# Patient Record
Sex: Male | Born: 1950 | Race: Black or African American | Hispanic: No | Marital: Married | Smoking: Former smoker
Health system: Southern US, Community
[De-identification: ages and names within clinical notes are randomized; demographics above are authoritative.]

## PROBLEM LIST (undated history)

## (undated) DIAGNOSIS — I213 ST elevation (STEMI) myocardial infarction of unspecified site: Secondary | ICD-10-CM

## (undated) DIAGNOSIS — E785 Hyperlipidemia, unspecified: Secondary | ICD-10-CM

## (undated) HISTORY — DX: ST elevation (STEMI) myocardial infarction of unspecified site: I21.3

## (undated) HISTORY — DX: Hyperlipidemia, unspecified: E78.5

---

## 1999-06-16 ENCOUNTER — Emergency Department (HOSPITAL_COMMUNITY): Admission: EM | Admit: 1999-06-16 | Discharge: 1999-06-16 | Payer: Self-pay | Admitting: Internal Medicine

## 1999-06-16 ENCOUNTER — Encounter: Payer: Self-pay | Admitting: Internal Medicine

## 2000-08-08 ENCOUNTER — Emergency Department (HOSPITAL_COMMUNITY): Admission: EM | Admit: 2000-08-08 | Discharge: 2000-08-08 | Payer: Self-pay | Admitting: Emergency Medicine

## 2000-08-09 ENCOUNTER — Encounter: Payer: Self-pay | Admitting: Emergency Medicine

## 2008-09-30 DIAGNOSIS — I10 Essential (primary) hypertension: Secondary | ICD-10-CM

## 2008-09-30 HISTORY — DX: Essential (primary) hypertension: I10

## 2013-09-30 DIAGNOSIS — I219 Acute myocardial infarction, unspecified: Secondary | ICD-10-CM

## 2013-09-30 DIAGNOSIS — R7303 Prediabetes: Secondary | ICD-10-CM

## 2013-09-30 HISTORY — DX: Prediabetes: R73.03

## 2013-09-30 HISTORY — DX: Acute myocardial infarction, unspecified: I21.9

## 2019-10-07 ENCOUNTER — Ambulatory Visit (INDEPENDENT_AMBULATORY_CARE_PROVIDER_SITE_OTHER): Payer: Commercial Managed Care - PPO | Admitting: Internal Medicine

## 2019-10-07 ENCOUNTER — Encounter: Payer: Self-pay | Admitting: Internal Medicine

## 2019-10-07 ENCOUNTER — Other Ambulatory Visit: Payer: Self-pay

## 2019-10-07 VITALS — BP 124/80 | HR 84 | Temp 97.2°F | Resp 16 | Ht 70.0 in | Wt 154.2 lb

## 2019-10-07 DIAGNOSIS — E782 Mixed hyperlipidemia: Secondary | ICD-10-CM

## 2019-10-07 DIAGNOSIS — Z8249 Family history of ischemic heart disease and other diseases of the circulatory system: Secondary | ICD-10-CM

## 2019-10-07 DIAGNOSIS — E559 Vitamin D deficiency, unspecified: Secondary | ICD-10-CM

## 2019-10-07 DIAGNOSIS — Z136 Encounter for screening for cardiovascular disorders: Secondary | ICD-10-CM | POA: Diagnosis not present

## 2019-10-07 DIAGNOSIS — Z0001 Encounter for general adult medical examination with abnormal findings: Secondary | ICD-10-CM

## 2019-10-07 DIAGNOSIS — Z0000 Encounter for general adult medical examination without abnormal findings: Secondary | ICD-10-CM

## 2019-10-07 DIAGNOSIS — N138 Other obstructive and reflux uropathy: Secondary | ICD-10-CM

## 2019-10-07 DIAGNOSIS — R7303 Prediabetes: Secondary | ICD-10-CM

## 2019-10-07 DIAGNOSIS — R5383 Other fatigue: Secondary | ICD-10-CM

## 2019-10-07 DIAGNOSIS — Z1211 Encounter for screening for malignant neoplasm of colon: Secondary | ICD-10-CM

## 2019-10-07 DIAGNOSIS — N401 Enlarged prostate with lower urinary tract symptoms: Secondary | ICD-10-CM

## 2019-10-07 DIAGNOSIS — I10 Essential (primary) hypertension: Secondary | ICD-10-CM | POA: Diagnosis not present

## 2019-10-07 DIAGNOSIS — Z1212 Encounter for screening for malignant neoplasm of rectum: Secondary | ICD-10-CM

## 2019-10-07 DIAGNOSIS — Z125 Encounter for screening for malignant neoplasm of prostate: Secondary | ICD-10-CM

## 2019-10-07 DIAGNOSIS — Z79899 Other long term (current) drug therapy: Secondary | ICD-10-CM

## 2019-10-07 DIAGNOSIS — R7309 Other abnormal glucose: Secondary | ICD-10-CM
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
## 2019-10-07 NOTE — Patient Instructions (Signed)

## 2019-10-08 LAB — COMPLETE METABOLIC PANEL WITH GFR
AG Ratio: 1.8 (calc) (ref 1.0–2.5)
ALT: 22 U/L (ref 9–46)
AST: 25 U/L (ref 10–35)
Albumin: 4.3 g/dL (ref 3.6–5.1)
Alkaline phosphatase (APISO): 85 U/L (ref 35–144)
BUN: 20 mg/dL (ref 7–25)
CO2: 35 mmol/L — ABNORMAL HIGH (ref 20–32)
Calcium: 10 mg/dL (ref 8.6–10.3)
Chloride: 101 mmol/L (ref 98–110)
Creat: 1.11 mg/dL (ref 0.70–1.25)
GFR, Est African American: 80 mL/min/{1.73_m2} (ref >=60)
GFR, Est Non African American: 69 mL/min/{1.73_m2} (ref >=60)
Globulin: 2.4 g/dL (calc) (ref 1.9–3.7)
Glucose, Bld: 87 mg/dL (ref 65–99)
Potassium: 5.1 mmol/L (ref 3.5–5.3)
Sodium: 139 mmol/L (ref 135–146)
Total Bilirubin: 0.5 mg/dL (ref 0.2–1.2)
Total Protein: 6.7 g/dL (ref 6.1–8.1)

## 2019-10-08 LAB — MICROALBUMIN / CREATININE URINE RATIO
Creatinine, Urine: 128 mg/dL (ref 20–320)
Microalb, Ur: <0.2 mg/dL

## 2019-10-08 LAB — PSA: PSA: 0.9 ng/mL (ref <=4.0)

## 2019-10-08 LAB — CBC WITH DIFFERENTIAL/PLATELET
Absolute Monocytes: 625 cells/uL (ref 200–950)
Basophils Absolute: 28 cells/uL (ref 0–200)
Basophils Relative: 0.6 %
Eosinophils Absolute: 169 cells/uL (ref 15–500)
Eosinophils Relative: 3.6 %
HCT: 40.3 % (ref 38.5–50.0)
Hemoglobin: 13.4 g/dL (ref 13.2–17.1)
Lymphs Abs: 1932 cells/uL (ref 850–3900)
MCH: 30.1 pg (ref 27.0–33.0)
MCHC: 33.3 g/dL (ref 32.0–36.0)
MCV: 90.6 fL (ref 80.0–100.0)
MPV: 10.4 fL (ref 7.5–12.5)
Monocytes Relative: 13.3 %
Neutro Abs: 1946 cells/uL (ref 1500–7800)
Neutrophils Relative %: 41.4 %
Platelets: 207 10*3/uL (ref 140–400)
RBC: 4.45 10*6/uL (ref 4.20–5.80)
RDW: 12.7 % (ref 11.0–15.0)
Total Lymphocyte: 41.1 %
WBC: 4.7 10*3/uL (ref 3.8–10.8)

## 2019-10-08 LAB — URINALYSIS, ROUTINE W REFLEX MICROSCOPIC
Bilirubin Urine: NEGATIVE
Glucose, UA: NEGATIVE
Hgb urine dipstick: NEGATIVE
Ketones, ur: NEGATIVE
Leukocytes,Ua: NEGATIVE
Nitrite: NEGATIVE
Protein, ur: NEGATIVE
Specific Gravity, Urine: 1.017 (ref 1.001–1.03)
pH: 7.5 (ref 5.0–8.0)

## 2019-10-08 LAB — MAGNESIUM: Magnesium: 2 mg/dL (ref 1.5–2.5)

## 2019-10-08 LAB — LIPID PANEL
Cholesterol: 164 mg/dL (ref <200)
HDL: 57 mg/dL (ref >=40)
LDL Cholesterol (Calc): 93 mg/dL (calc)
Non-HDL Cholesterol (Calc): 107 mg/dL (calc) (ref <130)
Total CHOL/HDL Ratio: 2.9 (calc) (ref <5.0)
Triglycerides: 57 mg/dL (ref <150)

## 2019-10-08 LAB — HEMOGLOBIN A1C
Hgb A1c MFr Bld: 6.2 % of total Hgb — ABNORMAL HIGH (ref <5.7)
Mean Plasma Glucose: 131 (calc)
eAG (mmol/L): 7.3 (calc)

## 2019-10-08 LAB — IRON, TOTAL/TOTAL IRON BINDING CAP
%SAT: 23 % (calc) (ref 20–48)
Iron: 63 ug/dL (ref 50–180)
TIBC: 277 mcg/dL (calc) (ref 250–425)

## 2019-10-08 LAB — VITAMIN D 25 HYDROXY (VIT D DEFICIENCY, FRACTURES): Vit D, 25-Hydroxy: 46 ng/mL (ref 30–100)

## 2019-10-08 LAB — TSH: TSH: 1.36 mIU/L (ref 0.40–4.50)

## 2019-10-08 LAB — INSULIN, RANDOM: Insulin: 2.8 u[IU]/mL

## 2019-10-08 LAB — TESTOSTERONE: Testosterone: 263 ng/dL (ref 250–827)

## 2019-10-08 LAB — VITAMIN B12: Vitamin B-12: 1654 pg/mL — ABNORMAL HIGH (ref 200–1100)

## 2019-10-10 ENCOUNTER — Encounter: Payer: Self-pay | Admitting: Internal Medicine

## 2019-10-10 MED ORDER — CARVEDILOL 6.25 MG PO TABS: ORAL_TABLET | ORAL | 0 refills | Status: DC

## 2019-10-10 MED ORDER — MULTI-VITAMIN/MINERALS PO TABS: ORAL_TABLET | ORAL | Status: DC

## 2019-10-10 MED ORDER — VITAMIN D3 125 MCG (5000 UT) PO TBDP: 5000.0000 [IU] | ORAL_TABLET | Freq: Every day | ORAL | 0 refills | Status: DC

## 2019-10-10 MED ORDER — ATORVASTATIN CALCIUM 80 MG PO TABS: ORAL_TABLET | ORAL | 0 refills | Status: DC

## 2019-10-10 MED ORDER — VITAMIN C PLUS 1000 MG PO TABS: ORAL_TABLET | ORAL | Status: DC

## 2019-10-10 MED ORDER — LISINOPRIL 2.5 MG PO TABS: ORAL_TABLET | ORAL | 0 refills | Status: DC

## 2020-01-10 ENCOUNTER — Ambulatory Visit (INDEPENDENT_AMBULATORY_CARE_PROVIDER_SITE_OTHER): Payer: Medicare HMO | Admitting: Internal Medicine

## 2020-01-10 ENCOUNTER — Other Ambulatory Visit: Payer: Self-pay

## 2020-01-10 VITALS — BP 110/64 | HR 72 | Temp 97.2°F | Resp 16 | Ht 70.0 in | Wt 153.6 lb

## 2020-01-10 DIAGNOSIS — Z87891 Personal history of nicotine dependence: Secondary | ICD-10-CM | POA: Insufficient documentation

## 2020-01-10 DIAGNOSIS — E559 Vitamin D deficiency, unspecified: Secondary | ICD-10-CM

## 2020-01-10 DIAGNOSIS — I10 Essential (primary) hypertension: Secondary | ICD-10-CM

## 2020-01-10 DIAGNOSIS — R7309 Other abnormal glucose: Secondary | ICD-10-CM | POA: Diagnosis not present

## 2020-01-10 DIAGNOSIS — R5383 Other fatigue: Secondary | ICD-10-CM | POA: Diagnosis not present

## 2020-01-10 DIAGNOSIS — E782 Mixed hyperlipidemia: Secondary | ICD-10-CM

## 2020-01-10 DIAGNOSIS — R7303 Prediabetes: Secondary | ICD-10-CM | POA: Diagnosis not present

## 2020-01-10 DIAGNOSIS — M778 Other enthesopathies, not elsewhere classified: Secondary | ICD-10-CM | POA: Diagnosis not present

## 2020-01-10 DIAGNOSIS — I2510 Atherosclerotic heart disease of native coronary artery without angina pectoris: Secondary | ICD-10-CM | POA: Insufficient documentation

## 2020-01-10 DIAGNOSIS — Z79899 Other long term (current) drug therapy: Secondary | ICD-10-CM | POA: Diagnosis not present

## 2020-01-10 DIAGNOSIS — E785 Hyperlipidemia, unspecified: Secondary | ICD-10-CM | POA: Insufficient documentation

## 2020-01-10 DIAGNOSIS — I739 Peripheral vascular disease, unspecified: Secondary | ICD-10-CM | POA: Insufficient documentation

## 2020-01-10 MED ORDER — DEXAMETHASONE 6 MG PO TABS: ORAL_TABLET | ORAL | 0 refills | Status: DC
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
## 2020-01-10 NOTE — Patient Instructions (Signed)
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
## 2020-01-11 LAB — LIPID PANEL
Cholesterol: 155 mg/dL (ref <200)
HDL: 58 mg/dL (ref >=40)
LDL Cholesterol (Calc): 69 mg/dL (calc)
Non-HDL Cholesterol (Calc): 97 mg/dL (calc) (ref <130)
Total CHOL/HDL Ratio: 2.7 (calc) (ref <5.0)
Triglycerides: 216 mg/dL — ABNORMAL HIGH (ref <150)

## 2020-01-11 LAB — COMPLETE METABOLIC PANEL WITH GFR
AG Ratio: 1.9 (calc) (ref 1.0–2.5)
ALT: 26 U/L (ref 9–46)
AST: 24 U/L (ref 10–35)
Albumin: 4.3 g/dL (ref 3.6–5.1)
Alkaline phosphatase (APISO): 81 U/L (ref 35–144)
BUN: 21 mg/dL (ref 7–25)
CO2: 30 mmol/L (ref 20–32)
Calcium: 9.7 mg/dL (ref 8.6–10.3)
Chloride: 103 mmol/L (ref 98–110)
Creat: 1.03 mg/dL (ref 0.70–1.25)
GFR, Est African American: 87 mL/min/{1.73_m2} (ref >=60)
GFR, Est Non African American: 75 mL/min/{1.73_m2} (ref >=60)
Globulin: 2.3 g/dL (calc) (ref 1.9–3.7)
Glucose, Bld: 98 mg/dL (ref 65–99)
Potassium: 4.2 mmol/L (ref 3.5–5.3)
Sodium: 141 mmol/L (ref 135–146)
Total Bilirubin: 0.3 mg/dL (ref 0.2–1.2)
Total Protein: 6.6 g/dL (ref 6.1–8.1)

## 2020-01-11 LAB — CBC WITH DIFFERENTIAL/PLATELET
Absolute Monocytes: 555 cells/uL (ref 200–950)
Basophils Absolute: 52 cells/uL (ref 0–200)
Basophils Relative: 1.2 %
Eosinophils Absolute: 159 cells/uL (ref 15–500)
Eosinophils Relative: 3.7 %
HCT: 39.7 % (ref 38.5–50.0)
Hemoglobin: 13.3 g/dL (ref 13.2–17.1)
Lymphs Abs: 1922 cells/uL (ref 850–3900)
MCH: 30.7 pg (ref 27.0–33.0)
MCHC: 33.5 g/dL (ref 32.0–36.0)
MCV: 91.7 fL (ref 80.0–100.0)
MPV: 10.8 fL (ref 7.5–12.5)
Monocytes Relative: 12.9 %
Neutro Abs: 1613 cells/uL (ref 1500–7800)
Neutrophils Relative %: 37.5 %
Platelets: 232 10*3/uL (ref 140–400)
RBC: 4.33 10*6/uL (ref 4.20–5.80)
RDW: 12.5 % (ref 11.0–15.0)
Total Lymphocyte: 44.7 %
WBC: 4.3 10*3/uL (ref 3.8–10.8)

## 2020-01-11 LAB — VITAMIN D 25 HYDROXY (VIT D DEFICIENCY, FRACTURES): Vit D, 25-Hydroxy: 116 ng/mL — ABNORMAL HIGH (ref 30–100)

## 2020-01-11 LAB — INSULIN, RANDOM: Insulin: 12.4 u[IU]/mL

## 2020-01-11 LAB — HEMOGLOBIN A1C
Hgb A1c MFr Bld: 6 % of total Hgb — ABNORMAL HIGH (ref <5.7)
Mean Plasma Glucose: 126 (calc)
eAG (mmol/L): 7 (calc)

## 2020-01-11 LAB — TSH: TSH: 1.17 mIU/L (ref 0.40–4.50)

## 2020-01-11 LAB — MAGNESIUM: Magnesium: 1.7 mg/dL (ref 1.5–2.5)

## 2020-01-12 ENCOUNTER — Ambulatory Visit: Payer: Commercial Managed Care - PPO | Admitting: Adult Health

## 2020-01-13 ENCOUNTER — Ambulatory Visit: Payer: Commercial Managed Care - PPO | Admitting: Internal Medicine

## 2020-01-16 ENCOUNTER — Encounter: Payer: Self-pay | Admitting: Internal Medicine

## 2020-01-21 ENCOUNTER — Telehealth: Payer: Self-pay | Admitting: *Deleted

## 2020-01-21 ENCOUNTER — Other Ambulatory Visit: Payer: Self-pay | Admitting: Internal Medicine

## 2020-01-21 NOTE — Telephone Encounter (Signed)
Patient called and reported his has taken the Dexamethasone for his right shoulder pain, with little improvement. A referral was called to [facility] and patient information was faxed to them. Patient will be called by the orthopaedic office in regard to an appointment. Also, the patient complained of continued cramping. Per Dr Melford Aase, stop the Atorvastatin for 1 week and see if cramping improves and call back with the result. Patient is aware.

## 2020-01-24 DIAGNOSIS — M542 Cervicalgia: Secondary | ICD-10-CM | POA: Diagnosis not present

## 2020-01-24 DIAGNOSIS — M67911 Unspecified disorder of synovium and tendon, right shoulder: Secondary | ICD-10-CM | POA: Diagnosis not present

## 2020-01-27 ENCOUNTER — Telehealth: Payer: Self-pay | Admitting: *Deleted

## 2020-01-27 DIAGNOSIS — M542 Cervicalgia: Secondary | ICD-10-CM | POA: Diagnosis not present

## 2020-01-27 DIAGNOSIS — M25611 Stiffness of right shoulder, not elsewhere classified: Secondary | ICD-10-CM | POA: Diagnosis not present

## 2020-01-27 NOTE — Telephone Encounter (Signed)
Patient called and reported he is still having muscle cramps. He stopped his Atorvastatin, as directed by Dr Melford Aase, but crampinp continues. Per Dr Melford Aase, the patient scheduled an OV for tomorrow.

## 2020-01-28 ENCOUNTER — Other Ambulatory Visit: Payer: Self-pay

## 2020-01-28 ENCOUNTER — Ambulatory Visit (INDEPENDENT_AMBULATORY_CARE_PROVIDER_SITE_OTHER): Payer: Medicare HMO | Admitting: Internal Medicine

## 2020-01-28 VITALS — BP 118/80 | HR 80 | Temp 97.2°F | Resp 16 | Ht 70.0 in | Wt 151.4 lb

## 2020-01-28 DIAGNOSIS — R252 Cramp and spasm: Secondary | ICD-10-CM | POA: Diagnosis not present

## 2020-01-28 DIAGNOSIS — I10 Essential (primary) hypertension: Secondary | ICD-10-CM | POA: Diagnosis not present

## 2020-01-28 DIAGNOSIS — D508 Other iron deficiency anemias: Secondary | ICD-10-CM | POA: Diagnosis not present

## 2020-01-28 DIAGNOSIS — Z79899 Other long term (current) drug therapy: Secondary | ICD-10-CM | POA: Diagnosis not present

## 2020-01-28 MED ORDER — CYCLOBENZAPRINE HCL 10 MG PO TABS: ORAL_TABLET | ORAL | 0 refills | Status: DC

## 2020-01-28 NOTE — Progress Notes (Signed)
   History of Present Illness:                  This very nice 69 y.o. MBM for HTN, HLD, ASCVD s/p PCA/ZStent, ASPVD s/p Stent of Lt SFA, Prediabetes and Vitamin D Deficiency.  On 01/10/2020 , he was Rx'd a Decadron taper for Rt shoulder pain . Not improved on 01/21/2020, patient was referred to [facility] for Rt shoulder pain. Also, he continued to c/o  muscle cramping & was advised to stop his Atorvastatin. On 4/29, he reported ongoing muscle cramps.    Medications  .  atorvastatin (LIPITOR) 80 MG tablet, Take 1 tablet Daily   - Off x 1 week since 4/23. Marland Kitchen  lisinopril (ZESTRIL) 2.5 MG tablet, Take 1 tablet Daily .  cilostazol (PLETAL) 100 MG tablet, Take  2  times daily. Marland Kitchen  Bioflavonoid Products (VITAMIN C PLUS) 1000 MG, Take 1 tablet Daily .  VITAMIN D 5000 U, Take 5,000 Units  daily .  Multiple Vitamins-Minerals, Take 1 tablet Daily .  cyclobenzaprine  10 MG tablet, Take 1/2 to 1 tablet 3 x /day as needed for Muscle Spasm  Problem list He has Hypertension; Hyperlipidemia; Vitamin D deficiency; Former smoker; CAD (coronary artery disease); and PAD (peripheral artery disease) (HCC) on their problem list.   Observations/Objective:  BP 118/80   Pulse 80   Temp (!) 97.2 F (36.2 C)   Resp 16   Ht 5\' 10"  (1.778 m)   Wt 151 lb 6.4 oz (68.7 kg)   BMI 21.72 kg/m   HEENT - WNL. Neck - supple.  Chest - Clear equal BS. Cor - Nl HS. RRR w/o sig MGR. PP 1(+). No edema. MS- FROM w/o deformities.  Gait Nl. Neuro -  Nl w/o focal abnormalities. Assessment and Plan:  1. Muscle cramps  - Iron,Total/Total Iron Binding Cap - Sedimentation rate; - C-reactive protein - Aldolase - Mitochondrial antibodies - Anti-smooth muscle antibody, IgG - CK  2. Essential hypertension  3. Iron deficiency anemia secondary to inadequate dietary iron intake  - Iron,Total/Total Iron Binding Cap  4. Medication management  - Iron,Total/Total Iron Binding Cap - Sedimentation rate;  Future - C-reactive protein - Aldolase - Mitochondrial antibodies - Anti-smooth muscle antibody, IgG - CK  Follow Up Instructions:      I discussed the assessment and treatment plan with the patient. The patient was provided an opportunity to ask questions and all were answered. The patient agreed with the plan and demonstrated an understanding of the instructions.       The patient was advised to call back or seek an in-person evaluation if the symptoms worsen or if the condition fails to improve as anticipated.  - DISPOSITION  PENDING LABS  Kirtland Bouchard, MD

## 2020-01-29 ENCOUNTER — Encounter: Payer: Self-pay | Admitting: Internal Medicine

## 2020-02-02 LAB — C-REACTIVE PROTEIN: CRP: 11 mg/L — ABNORMAL HIGH (ref <8.0)

## 2020-02-02 LAB — ALDOLASE: Aldolase: 4.9 U/L (ref <=8.1)

## 2020-02-02 LAB — MITOCHONDRIAL ANTIBODIES: Mitochondrial M2 Ab, IgG: <=20 U

## 2020-02-02 LAB — IRON, TOTAL/TOTAL IRON BINDING CAP
%SAT: 20 % (calc) (ref 20–48)
Iron: 60 ug/dL (ref 50–180)
TIBC: 306 mcg/dL (calc) (ref 250–425)

## 2020-02-02 LAB — CK: Total CK: 170 U/L (ref 44–196)

## 2020-02-02 LAB — ANTI-SMOOTH MUSCLE ANTIBODY, IGG: Actin (Smooth Muscle) Antibody (IGG): <20 U (ref <20)

## 2020-02-03 ENCOUNTER — Other Ambulatory Visit: Payer: Medicare HMO

## 2020-02-03 ENCOUNTER — Other Ambulatory Visit: Payer: Self-pay

## 2020-02-03 DIAGNOSIS — R252 Cramp and spasm: Secondary | ICD-10-CM

## 2020-02-03 DIAGNOSIS — Z79899 Other long term (current) drug therapy: Secondary | ICD-10-CM

## 2020-02-03 LAB — SEDIMENTATION RATE: Sed Rate: 6 mm/h (ref 0–20)

## 2020-02-07 DIAGNOSIS — M542 Cervicalgia: Secondary | ICD-10-CM | POA: Diagnosis not present

## 2020-02-07 DIAGNOSIS — M25611 Stiffness of right shoulder, not elsewhere classified: Secondary | ICD-10-CM | POA: Diagnosis not present

## 2020-02-09 DIAGNOSIS — M542 Cervicalgia: Secondary | ICD-10-CM | POA: Diagnosis not present

## 2020-02-09 DIAGNOSIS — M25511 Pain in right shoulder: Secondary | ICD-10-CM | POA: Diagnosis not present

## 2020-02-09 DIAGNOSIS — M4722 Other spondylosis with radiculopathy, cervical region: Secondary | ICD-10-CM | POA: Diagnosis not present

## 2020-02-14 ENCOUNTER — Encounter: Payer: Self-pay | Admitting: Internal Medicine

## 2020-02-15 ENCOUNTER — Telehealth: Payer: Self-pay

## 2020-02-15 NOTE — Telephone Encounter (Signed)
Refill request for Tramadol. Per Dr. Melford Aase, patient advised to stop taking Tramadol and take Tylenol, 500mg , two tablets, 4 x day. Take it with meals and at bedtime. Patient agreed to try this.

## 2020-02-29 DIAGNOSIS — M542 Cervicalgia: Secondary | ICD-10-CM | POA: Diagnosis not present

## 2020-03-10 DIAGNOSIS — I25118 Atherosclerotic heart disease of native coronary artery with other forms of angina pectoris: Secondary | ICD-10-CM | POA: Diagnosis not present

## 2020-03-10 DIAGNOSIS — I10 Essential (primary) hypertension: Secondary | ICD-10-CM | POA: Diagnosis not present

## 2020-03-10 DIAGNOSIS — I739 Peripheral vascular disease, unspecified: Secondary | ICD-10-CM | POA: Diagnosis not present

## 2020-03-13 DIAGNOSIS — M25511 Pain in right shoulder: Secondary | ICD-10-CM | POA: Diagnosis not present

## 2020-03-13 DIAGNOSIS — M542 Cervicalgia: Secondary | ICD-10-CM | POA: Diagnosis not present

## 2020-03-23 DIAGNOSIS — M25511 Pain in right shoulder: Secondary | ICD-10-CM | POA: Diagnosis not present

## 2020-04-17 ENCOUNTER — Ambulatory Visit: Payer: Commercial Managed Care - PPO | Admitting: Internal Medicine

## 2020-04-19 DIAGNOSIS — M25511 Pain in right shoulder: Secondary | ICD-10-CM | POA: Diagnosis not present

## 2020-04-27 ENCOUNTER — Ambulatory Visit: Payer: Medicare HMO | Admitting: Adult Health

## 2020-04-27 DIAGNOSIS — Z0000 Encounter for general adult medical examination without abnormal findings: Secondary | ICD-10-CM
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
## 2020-05-01 ENCOUNTER — Ambulatory Visit (INDEPENDENT_AMBULATORY_CARE_PROVIDER_SITE_OTHER): Payer: Medicare HMO | Admitting: Adult Health

## 2020-05-01 ENCOUNTER — Encounter: Payer: Self-pay | Admitting: Adult Health

## 2020-05-01 ENCOUNTER — Other Ambulatory Visit: Payer: Self-pay

## 2020-05-01 VITALS — BP 128/66 | HR 73 | Resp 18 | Wt 157.8 lb

## 2020-05-01 DIAGNOSIS — E559 Vitamin D deficiency, unspecified: Secondary | ICD-10-CM | POA: Diagnosis not present

## 2020-05-01 DIAGNOSIS — Z1159 Encounter for screening for other viral diseases: Secondary | ICD-10-CM | POA: Diagnosis not present

## 2020-05-01 DIAGNOSIS — E785 Hyperlipidemia, unspecified: Secondary | ICD-10-CM | POA: Diagnosis not present

## 2020-05-01 DIAGNOSIS — I739 Peripheral vascular disease, unspecified: Secondary | ICD-10-CM

## 2020-05-01 DIAGNOSIS — Z0001 Encounter for general adult medical examination with abnormal findings: Secondary | ICD-10-CM | POA: Diagnosis not present

## 2020-05-01 DIAGNOSIS — I2510 Atherosclerotic heart disease of native coronary artery without angina pectoris: Secondary | ICD-10-CM | POA: Diagnosis not present

## 2020-05-01 DIAGNOSIS — I10 Essential (primary) hypertension: Secondary | ICD-10-CM

## 2020-05-01 DIAGNOSIS — Z0000 Encounter for general adult medical examination without abnormal findings: Secondary | ICD-10-CM

## 2020-05-01 DIAGNOSIS — Z87891 Personal history of nicotine dependence: Secondary | ICD-10-CM | POA: Diagnosis not present

## 2020-05-01 DIAGNOSIS — R6889 Other general symptoms and signs: Secondary | ICD-10-CM

## 2020-05-01 NOTE — Patient Instructions (Addendum)
Patrick Tucker , Thank you for taking time to come for your Annual Wellness Visit. I appreciate your ongoing commitment to your health goals. Please review the following plan we discussed and let me know if I can assist you in the future.   These are the goals we discussed: Goals    . HEMOGLOBIN A1C < 5.7    . LDL CALC < 70       This is a list of the screening recommended for you and due dates:  Health Maintenance  Topic Date Due  .  Hepatitis C: One time screening is recommended by Center for Disease Control  (CDC) for  adults born from [birth year] through 1963.   Never done  . Colon Cancer Screening  Never done  . Flu Shot  04/30/2020  . Tetanus Vaccine  09/30/2024  . COVID-19 Vaccine  Completed  . Pneumonia vaccines  Discontinued    Please ask your wife who your GI doc was - or which office and let us know which office   Please check date of last tetanus vaccination if possible - otherwise will leave estimation of 2016. If you have any large cuts or step on a nail, would recommend getting boosted.    A great goal to work towards is aiming to get in a serving daily of some of the most nutritionally dense foods - G- BOMBS daily            High-Fiber Diet Fiber, also called dietary fiber, is a type of carbohydrate that is found in fruits, vegetables, whole grains, and beans. A high-fiber diet can have many health benefits. Your health care provider may recommend a high-fiber diet to help:  Prevent constipation. Fiber can make your bowel movements more regular.  Lower your cholesterol.  Relieve the following conditions: ? Swelling of veins in the anus (hemorrhoids). ? Swelling and irritation (inflammation) of specific areas of the digestive tract (uncomplicated diverticulosis). ? A problem of the large intestine (colon) that sometimes causes pain and diarrhea (irritable bowel syndrome, IBS).  Prevent overeating as part of a weight-loss plan.  Prevent heart disease, type 2  diabetes, and certain cancers. What is my plan? The recommended daily fiber intake in grams (g) includes:  38 g for men age 93 or younger.  30 g for men over age 81.  59 g for women age 106 or younger.  21 g for women over age 52. You can get the recommended daily intake of dietary fiber by:  Eating a variety of fruits, vegetables, grains, and beans.  Taking a fiber supplement, if it is not possible to get enough fiber through your diet. What do I need to know about a high-fiber diet?  It is better to get fiber through food sources rather than from fiber supplements. There is not a lot of research about how effective supplements are.  Always check the fiber content on the nutrition facts label of any prepackaged food. Look for foods that contain 5 g of fiber or more per serving.  Talk with a diet and nutrition specialist (dietitian) if you have questions about specific foods that are recommended or not recommended for your medical condition, especially if those foods are not listed below.  Gradually increase how much fiber you consume. If you increase your intake of dietary fiber too quickly, you may have bloating, cramping, or gas.  Drink plenty of water. Water helps you to digest fiber. What are tips for following this plan?  Eat a wide variety of high-fiber foods.  Make sure that half of the grains that you eat each day are whole grains.  Eat breads and cereals that are made with whole-grain flour instead of refined flour or white flour.  Eat brown rice, bulgur wheat, or millet instead of white rice.  Start the day with a breakfast that is high in fiber, such as a cereal that contains 5 g of fiber or more per serving.  Use beans in place of meat in soups, salads, and pasta dishes.  Eat high-fiber snacks, such as berries, raw vegetables, nuts, and popcorn.  Choose whole fruits and vegetables instead of processed forms like juice or sauce. What foods can I  eat?  Fruits Berries. Pears. Apples. Oranges. Avocado. Prunes and raisins. Dried figs. Vegetables Sweet potatoes. Spinach. Kale. Artichokes. Cabbage. Broccoli. Cauliflower. Green peas. Carrots. Squash. Grains Whole-grain breads. Multigrain cereal. Oats and oatmeal. Brown rice. Barley. Bulgur wheat. [place]. Quinoa. Bran muffins. Popcorn. Rye wafer crackers. Meats and other proteins Navy, kidney, and pinto beans. Soybeans. Split peas. Lentils. Nuts and seeds. Dairy Fiber-fortified yogurt. Beverages Fiber-fortified soy milk. Fiber-fortified orange juice. Other foods Fiber bars. The items listed above may not be a complete list of recommended foods and beverages. Contact a dietitian for more options. What foods are not recommended? Fruits Fruit juice. Cooked, strained fruit. Vegetables Fried potatoes. Canned vegetables. Well-cooked vegetables. Grains White bread. Pasta made with refined flour. White rice. Meats and other proteins Fatty cuts of meat. Fried chicken or fried fish. Dairy Milk. Yogurt. Cream cheese. Sour cream. Fats and oils Butters. Beverages Soft drinks. Other foods Cakes and pastries. The items listed above may not be a complete list of foods and beverages to avoid. Contact a dietitian for more information. Summary  Fiber is a type of carbohydrate. It is found in fruits, vegetables, whole grains, and beans.  There are many health benefits of eating a high-fiber diet, such as preventing constipation, lowering blood cholesterol, helping with weight loss, and reducing your risk of heart disease, diabetes, and certain cancers.  Gradually increase your intake of fiber. Increasing too fast can result in cramping, bloating, and gas. Drink plenty of water while you increase your fiber.  The best sources of fiber include whole fruits and vegetables, whole grains, nuts, seeds, and beans. This information is not intended to replace advice given to you by your health care  provider. Make sure you discuss any questions you have with your health care provider. Document Revised: 07/21/2017 Document Reviewed: 07/21/2017 Elsevier Patient Education  2020 [employer].

## 2020-05-02 LAB — CBC WITH DIFFERENTIAL/PLATELET
Absolute Monocytes: 515 cells/uL (ref 200–950)
Basophils Absolute: 31 cells/uL (ref 0–200)
Basophils Relative: 0.7 %
Eosinophils Absolute: 233 cells/uL (ref 15–500)
Eosinophils Relative: 5.3 %
HCT: 41.5 % (ref 38.5–50.0)
Hemoglobin: 13.9 g/dL (ref 13.2–17.1)
Lymphs Abs: 2226 cells/uL (ref 850–3900)
MCH: 30.8 pg (ref 27.0–33.0)
MCHC: 33.5 g/dL (ref 32.0–36.0)
MCV: 92 fL (ref 80.0–100.0)
MPV: 10.3 fL (ref 7.5–12.5)
Monocytes Relative: 11.7 %
Neutro Abs: 1395 cells/uL — ABNORMAL LOW (ref 1500–7800)
Neutrophils Relative %: 31.7 %
Platelets: 248 10*3/uL (ref 140–400)
RBC: 4.51 10*6/uL (ref 4.20–5.80)
RDW: 12.8 % (ref 11.0–15.0)
Total Lymphocyte: 50.6 %
WBC: 4.4 10*3/uL (ref 3.8–10.8)

## 2020-05-02 LAB — TSH: TSH: 2.28 mIU/L (ref 0.40–4.50)

## 2020-05-02 LAB — LIPID PANEL
Cholesterol: 216 mg/dL — ABNORMAL HIGH (ref <200)
HDL: 56 mg/dL (ref >=40)
LDL Cholesterol (Calc): 133 mg/dL (calc) — ABNORMAL HIGH
Non-HDL Cholesterol (Calc): 160 mg/dL (calc) — ABNORMAL HIGH (ref <130)
Total CHOL/HDL Ratio: 3.9 (calc) (ref <5.0)
Triglycerides: 147 mg/dL (ref <150)

## 2020-05-02 LAB — COMPLETE METABOLIC PANEL WITH GFR
AG Ratio: 1.7 (calc) (ref 1.0–2.5)
ALT: 21 U/L (ref 9–46)
AST: 23 U/L (ref 10–35)
Albumin: 4.5 g/dL (ref 3.6–5.1)
Alkaline phosphatase (APISO): 72 U/L (ref 35–144)
BUN/Creatinine Ratio: 25 (calc) — ABNORMAL HIGH (ref 6–22)
BUN: 30 mg/dL — ABNORMAL HIGH (ref 7–25)
CO2: 30 mmol/L (ref 20–32)
Calcium: 10 mg/dL (ref 8.6–10.3)
Chloride: 102 mmol/L (ref 98–110)
Creat: 1.22 mg/dL (ref 0.70–1.25)
GFR, Est African American: 71 mL/min/{1.73_m2} (ref >=60)
GFR, Est Non African American: 61 mL/min/{1.73_m2} (ref >=60)
Globulin: 2.7 g/dL (calc) (ref 1.9–3.7)
Glucose, Bld: 95 mg/dL (ref 65–99)
Potassium: 4.4 mmol/L (ref 3.5–5.3)
Sodium: 138 mmol/L (ref 135–146)
Total Bilirubin: 0.3 mg/dL (ref 0.2–1.2)
Total Protein: 7.2 g/dL (ref 6.1–8.1)

## 2020-05-02 LAB — HEPATITIS C ANTIBODY
Hepatitis C Ab: NONREACTIVE
SIGNAL TO CUT-OFF: 0.03 (ref <1.00)

## 2020-05-02 LAB — MAGNESIUM: Magnesium: 2.1 mg/dL (ref 1.5–2.5)

## 2020-05-04 NOTE — Progress Notes (Signed)
PATIENT IS AWARE OF LAB RESULTS AND INSTRUCTIONS. -E WELCH

## 2020-05-04 NOTE — Progress Notes (Signed)
PATIENT IS AWARE OF LAB RESULTS. Patrick [facility]

## 2020-05-05 ENCOUNTER — Encounter (HOSPITAL_COMMUNITY)
Admission: EM | Disposition: A | Discharge status: Home / Self Care | Payer: Self-pay | Source: Home / Self Care | Attending: Cardiovascular Disease

## 2020-05-05 ENCOUNTER — Ambulatory Visit (HOSPITAL_COMMUNITY): Admission: EM | Admit: 2020-05-05 | Discharge: 2020-05-05 | Discharge status: Against Medical Advice | Payer: Self-pay

## 2020-05-05 ENCOUNTER — Other Ambulatory Visit: Payer: Self-pay

## 2020-05-05 ENCOUNTER — Inpatient Hospital Stay (HOSPITAL_COMMUNITY)
Admission: EM | Admit: 2020-05-05 | Discharge: 2020-05-07 | DRG: 246 | Disposition: A | Discharge status: Home / Self Care | Payer: Medicare HMO | Attending: Cardiovascular Disease | Admitting: Cardiovascular Disease

## 2020-05-05 ENCOUNTER — Encounter (HOSPITAL_COMMUNITY): Payer: Self-pay | Admitting: Emergency Medicine

## 2020-05-05 ENCOUNTER — Emergency Department (HOSPITAL_COMMUNITY): Payer: Medicare HMO

## 2020-05-05 DIAGNOSIS — I5021 Acute systolic (congestive) heart failure: Secondary | ICD-10-CM | POA: Diagnosis present

## 2020-05-05 DIAGNOSIS — R7303 Prediabetes: Secondary | ICD-10-CM | POA: Diagnosis not present

## 2020-05-05 DIAGNOSIS — I25118 Atherosclerotic heart disease of native coronary artery with other forms of angina pectoris: Secondary | ICD-10-CM | POA: Diagnosis present

## 2020-05-05 DIAGNOSIS — I2510 Atherosclerotic heart disease of native coronary artery without angina pectoris: Secondary | ICD-10-CM

## 2020-05-05 DIAGNOSIS — I213 ST elevation (STEMI) myocardial infarction of unspecified site: Principal | ICD-10-CM | POA: Diagnosis present

## 2020-05-05 DIAGNOSIS — Z8249 Family history of ischemic heart disease and other diseases of the circulatory system: Secondary | ICD-10-CM | POA: Diagnosis not present

## 2020-05-05 DIAGNOSIS — I2121 ST elevation (STEMI) myocardial infarction involving left circumflex coronary artery: Secondary | ICD-10-CM

## 2020-05-05 DIAGNOSIS — Z955 Presence of coronary angioplasty implant and graft: Secondary | ICD-10-CM | POA: Diagnosis not present

## 2020-05-05 DIAGNOSIS — E785 Hyperlipidemia, unspecified: Secondary | ICD-10-CM | POA: Diagnosis present

## 2020-05-05 DIAGNOSIS — Z825 Family history of asthma and other chronic lower respiratory diseases: Secondary | ICD-10-CM | POA: Diagnosis not present

## 2020-05-05 DIAGNOSIS — I249 Acute ischemic heart disease, unspecified: Secondary | ICD-10-CM

## 2020-05-05 DIAGNOSIS — R0602 Shortness of breath: Secondary | ICD-10-CM | POA: Diagnosis not present

## 2020-05-05 DIAGNOSIS — Z833 Family history of diabetes mellitus: Secondary | ICD-10-CM

## 2020-05-05 DIAGNOSIS — Z87891 Personal history of nicotine dependence: Secondary | ICD-10-CM | POA: Diagnosis not present

## 2020-05-05 DIAGNOSIS — E559 Vitamin D deficiency, unspecified: Secondary | ICD-10-CM | POA: Diagnosis not present

## 2020-05-05 DIAGNOSIS — I2102 ST elevation (STEMI) myocardial infarction involving left anterior descending coronary artery: Secondary | ICD-10-CM

## 2020-05-05 DIAGNOSIS — I10 Essential (primary) hypertension: Secondary | ICD-10-CM | POA: Diagnosis present

## 2020-05-05 DIAGNOSIS — I739 Peripheral vascular disease, unspecified: Secondary | ICD-10-CM | POA: Diagnosis not present

## 2020-05-05 DIAGNOSIS — I252 Old myocardial infarction: Secondary | ICD-10-CM | POA: Diagnosis not present

## 2020-05-05 DIAGNOSIS — Z79899 Other long term (current) drug therapy: Secondary | ICD-10-CM

## 2020-05-05 DIAGNOSIS — Z20822 Contact with and (suspected) exposure to covid-19: Secondary | ICD-10-CM | POA: Diagnosis not present

## 2020-05-05 DIAGNOSIS — Z823 Family history of stroke: Secondary | ICD-10-CM

## 2020-05-05 DIAGNOSIS — I2584 Coronary atherosclerosis due to calcified coronary lesion: Secondary | ICD-10-CM | POA: Diagnosis not present

## 2020-05-05 DIAGNOSIS — R079 Chest pain, unspecified: Secondary | ICD-10-CM | POA: Diagnosis not present

## 2020-05-05 HISTORY — PX: CORONARY/GRAFT ACUTE MI REVASCULARIZATION: CATH118305

## 2020-05-05 HISTORY — PX: CORONARY STENT INTERVENTION: CATH118234

## 2020-05-05 HISTORY — PX: LEFT HEART CATH AND CORONARY ANGIOGRAPHY: CATH118249

## 2020-05-05 LAB — CBC
HCT: 43.3 % (ref 39.0–52.0)
Hemoglobin: 15.1 g/dL (ref 13.0–17.0)
MCH: 31.6 pg (ref 26.0–34.0)
MCHC: 34.9 g/dL (ref 30.0–36.0)
MCV: 90.6 fL (ref 80.0–100.0)
Platelets: 304 10*3/uL (ref 150–400)
RBC: 4.78 MIL/uL (ref 4.22–5.81)
RDW: 13.2 % (ref 11.5–15.5)
WBC: 4.9 10*3/uL (ref 4.0–10.5)
nRBC: 0 % (ref 0.0–0.2)

## 2020-05-05 LAB — LIPID PANEL
Cholesterol: 229 mg/dL — ABNORMAL HIGH (ref 0–200)
HDL: 59 mg/dL (ref >40)
LDL Cholesterol: 145 mg/dL — ABNORMAL HIGH (ref 0–99)
Total CHOL/HDL Ratio: 3.9 RATIO
Triglycerides: 123 mg/dL (ref <150)
VLDL: 25 mg/dL (ref 0–40)

## 2020-05-05 LAB — PROTIME-INR
INR: 0.9 (ref 0.8–1.2)
Prothrombin Time: 12 seconds (ref 11.4–15.2)

## 2020-05-05 LAB — TROPONIN I (HIGH SENSITIVITY): Troponin I (High Sensitivity): 41 ng/L — ABNORMAL HIGH (ref <18)

## 2020-05-05 LAB — BASIC METABOLIC PANEL
Anion gap: 10 (ref 5–15)
BUN: 23 mg/dL (ref 8–23)
CO2: 28 mmol/L (ref 22–32)
Calcium: 9.9 mg/dL (ref 8.9–10.3)
Chloride: 99 mmol/L (ref 98–111)
Creatinine, Ser: 1.4 mg/dL — ABNORMAL HIGH (ref 0.61–1.24)
GFR calc Af Amer: >60 mL/min (ref >60)
GFR calc non Af Amer: 52 mL/min — ABNORMAL LOW (ref >60)
Glucose, Bld: 123 mg/dL — ABNORMAL HIGH (ref 70–99)
Potassium: 5.1 mmol/L (ref 3.5–5.1)
Sodium: 137 mmol/L (ref 135–145)

## 2020-05-05 LAB — HEMOGLOBIN A1C
Hgb A1c MFr Bld: 6.1 % — ABNORMAL HIGH (ref 4.8–5.6)
Mean Plasma Glucose: 128.37 mg/dL

## 2020-05-05 LAB — SARS CORONAVIRUS 2 BY RT PCR (HOSPITAL ORDER, PERFORMED IN ~~LOC~~ HOSPITAL LAB): SARS Coronavirus 2: NEGATIVE

## 2020-05-05 LAB — APTT: aPTT: 33 seconds (ref 24–36)

## 2020-05-05 SURGERY — CORONARY/GRAFT ACUTE MI REVASCULARIZATION
Anesthesia: LOCAL

## 2020-05-05 MED ORDER — HEPARIN (PORCINE) IN NACL 1000-0.9 UT/500ML-% IV SOLN
INTRAVENOUS | Status: DC | PRN
  Administered 2020-05-05 (×2): 500 mL

## 2020-05-05 MED ORDER — ASPIRIN 81 MG PO CHEW
324.0000 mg | CHEWABLE_TABLET | Freq: Once | ORAL | Status: AC
  Administered 2020-05-05: 324 mg via ORAL

## 2020-05-05 MED ORDER — ACETAMINOPHEN 325 MG PO TABS: 650.0000 mg | ORAL_TABLET | ORAL | Status: DC | PRN

## 2020-05-05 MED ORDER — FENTANYL CITRATE (PF) 100 MCG/2ML IJ SOLN
INTRAMUSCULAR | Status: AC
  Filled 2020-05-05: qty 2

## 2020-05-05 MED ORDER — TICAGRELOR 90 MG PO TABS
90.0000 mg | ORAL_TABLET | Freq: Two times a day (BID) | ORAL | Status: DC
  Administered 2020-05-06 – 2020-05-07 (×3): 90 mg via ORAL
  Filled 2020-05-05 (×3): qty 1

## 2020-05-05 MED ORDER — SODIUM CHLORIDE 0.9 % IV SOLN
INTRAVENOUS | Status: AC | PRN
  Administered 2020-05-05: 1.75 mg/kg/h
  Administered 2020-05-05: 1.75 mg/kg/h via INTRAVENOUS

## 2020-05-05 MED ORDER — HEPARIN SODIUM (PORCINE) 5000 UNIT/ML IJ SOLN
4000.0000 [IU] | Freq: Once | INTRAMUSCULAR | Status: AC
  Administered 2020-05-05: 4000 [IU] via INTRAVENOUS

## 2020-05-05 MED ORDER — MIDAZOLAM HCL 2 MG/2ML IJ SOLN
INTRAMUSCULAR | Status: AC
  Filled 2020-05-05: qty 2

## 2020-05-05 MED ORDER — HEPARIN SODIUM (PORCINE) 1000 UNIT/ML IJ SOLN
INTRAMUSCULAR | Status: DC | PRN
  Administered 2020-05-05: 2000 [IU] via INTRAVENOUS

## 2020-05-05 MED ORDER — NITROGLYCERIN 1 MG/10 ML FOR IR/CATH LAB
INTRA_ARTERIAL | Status: AC
  Filled 2020-05-05: qty 10

## 2020-05-05 MED ORDER — HEPARIN (PORCINE) IN NACL 1000-0.9 UT/500ML-% IV SOLN
INTRAVENOUS | Status: AC
  Filled 2020-05-05: qty 1000

## 2020-05-05 MED ORDER — IOHEXOL 350 MG/ML SOLN
INTRAVENOUS | Status: DC | PRN
  Administered 2020-05-05: 225 mL via INTRA_ARTERIAL

## 2020-05-05 MED ORDER — NITROGLYCERIN 1 MG/10 ML FOR IR/CATH LAB
INTRA_ARTERIAL | Status: DC | PRN
  Administered 2020-05-05 (×3): 200 ug via INTRACORONARY

## 2020-05-05 MED ORDER — METOPROLOL TARTRATE 12.5 MG HALF TABLET
12.5000 mg | ORAL_TABLET | Freq: Two times a day (BID) | ORAL | Status: DC
  Administered 2020-05-05 – 2020-05-06 (×2): 12.5 mg via ORAL
  Filled 2020-05-05 (×2): qty 1

## 2020-05-05 MED ORDER — SODIUM CHLORIDE 0.9 % WEIGHT BASED INFUSION
1.0000 mL/kg/h | INTRAVENOUS | Status: DC
  Administered 2020-05-05: 1 mL/kg/h via INTRAVENOUS

## 2020-05-05 MED ORDER — BIVALIRUDIN BOLUS VIA INFUSION - CUPID
INTRAVENOUS | Status: DC | PRN
  Administered 2020-05-05: 53.7 mg via INTRAVENOUS

## 2020-05-05 MED ORDER — LIDOCAINE HCL (PF) 1 % IJ SOLN
INTRAMUSCULAR | Status: AC
  Filled 2020-05-05: qty 30

## 2020-05-05 MED ORDER — HEPARIN SODIUM (PORCINE) 1000 UNIT/ML IJ SOLN
INTRAMUSCULAR | Status: AC
  Filled 2020-05-05: qty 1

## 2020-05-05 MED ORDER — ONDANSETRON HCL 4 MG/2ML IJ SOLN: 4.0000 mg | Freq: Four times a day (QID) | INTRAMUSCULAR | Status: DC | PRN

## 2020-05-05 MED ORDER — VERAPAMIL HCL 2.5 MG/ML IV SOLN
INTRAVENOUS | Status: AC
  Filled 2020-05-05: qty 2

## 2020-05-05 MED ORDER — BIVALIRUDIN TRIFLUOROACETATE 250 MG IV SOLR
INTRAVENOUS | Status: AC
  Filled 2020-05-05: qty 250

## 2020-05-05 MED ORDER — TICAGRELOR 90 MG PO TABS
ORAL_TABLET | ORAL | Status: AC
  Filled 2020-05-05: qty 2

## 2020-05-05 MED ORDER — FENTANYL CITRATE (PF) 100 MCG/2ML IJ SOLN
INTRAMUSCULAR | Status: DC | PRN
  Administered 2020-05-05: 25 ug via INTRAVENOUS
  Administered 2020-05-05: 50 ug via INTRAVENOUS

## 2020-05-05 MED ORDER — TICAGRELOR 90 MG PO TABS
ORAL_TABLET | ORAL | Status: DC | PRN
  Administered 2020-05-05: 180 mg via ORAL

## 2020-05-05 MED ORDER — MIDAZOLAM HCL 2 MG/2ML IJ SOLN
INTRAMUSCULAR | Status: DC | PRN
  Administered 2020-05-05: 2 mg via INTRAVENOUS
  Administered 2020-05-05: 1 mg via INTRAVENOUS

## 2020-05-05 MED ORDER — ASPIRIN 81 MG PO CHEW
81.0000 mg | CHEWABLE_TABLET | Freq: Every day | ORAL | Status: DC
  Administered 2020-05-06 – 2020-05-07 (×2): 81 mg via ORAL
  Filled 2020-05-05 (×2): qty 1

## 2020-05-05 MED ORDER — LABETALOL HCL 5 MG/ML IV SOLN: 10.0000 mg | INTRAVENOUS | Status: AC | PRN

## 2020-05-05 MED ORDER — ATORVASTATIN CALCIUM 80 MG PO TABS
80.0000 mg | ORAL_TABLET | Freq: Every day | ORAL | Status: DC
  Administered 2020-05-06 – 2020-05-07 (×2): 80 mg via ORAL
  Filled 2020-05-05 (×3): qty 1

## 2020-05-05 MED ORDER — DIAZEPAM 5 MG PO TABS: 5.0000 mg | ORAL_TABLET | Freq: Four times a day (QID) | ORAL | Status: DC | PRN

## 2020-05-05 MED ORDER — SODIUM CHLORIDE 0.9% FLUSH
3.0000 mL | Freq: Two times a day (BID) | INTRAVENOUS | Status: DC
  Administered 2020-05-06 – 2020-05-07 (×3): 3 mL via INTRAVENOUS

## 2020-05-05 MED ORDER — VERAPAMIL HCL 2.5 MG/ML IV SOLN
INTRAVENOUS | Status: DC | PRN
  Administered 2020-05-05: 10 mL via INTRA_ARTERIAL

## 2020-05-05 MED ORDER — HYDRALAZINE HCL 20 MG/ML IJ SOLN: 10.0000 mg | INTRAMUSCULAR | Status: AC | PRN

## 2020-05-05 MED ORDER — SODIUM CHLORIDE 0.9% FLUSH: 3.0000 mL | INTRAVENOUS | Status: DC | PRN

## 2020-05-05 MED ORDER — SODIUM CHLORIDE 0.9% FLUSH
3.0000 mL | Freq: Once | INTRAVENOUS | Status: AC
  Administered 2020-05-05: 3 mL via INTRAVENOUS

## 2020-05-05 MED ORDER — LIDOCAINE HCL (PF) 1 % IJ SOLN
INTRAMUSCULAR | Status: DC | PRN
  Administered 2020-05-05: 2 mL

## 2020-05-05 MED ORDER — SODIUM CHLORIDE 0.9 % IV SOLN: 250.0000 mL | INTRAVENOUS | Status: DC | PRN

## 2020-05-05 SURGICAL SUPPLY — 24 items
BALLN EMERGE MR 2.0X8 (BALLOONS) ×2
BALLN SAPPHIRE 2.5X15 (BALLOONS) ×2
BALLN SAPPHIRE ~~LOC~~ 2.5X8 (BALLOONS) ×1 IMPLANT
BALLN SAPPHIRE ~~LOC~~ 3.25X18 (BALLOONS) ×1 IMPLANT
BALLOON EMERGE MR 2.0X8 (BALLOONS) IMPLANT
BALLOON SAPPHIRE 2.5X15 (BALLOONS) IMPLANT
CATH INFINITI 5FR JL4 (CATHETERS) ×1 IMPLANT
CATH OPTITORQUE TIG 4.0 5F (CATHETERS) ×1 IMPLANT
CATH VISTA GUIDE 6FR JL4 (CATHETERS) ×1 IMPLANT
CATH VISTA GUIDE 6FR XBLAD3.5 (CATHETERS) ×1 IMPLANT
DEVICE RAD COMP TR BAND LRG (VASCULAR PRODUCTS) ×1 IMPLANT
GLIDESHEATH SLEND SS 6F .021 (SHEATH) ×1 IMPLANT
GUIDEWIRE INQWIRE 1.5J.035X260 (WIRE) IMPLANT
INQWIRE 1.5J .035X260CM (WIRE) ×2
KIT ENCORE 26 ADVANTAGE (KITS) ×1 IMPLANT
KIT HEART LEFT (KITS) ×2 IMPLANT
PACK CARDIAC CATHETERIZATION (CUSTOM PROCEDURE TRAY) ×2 IMPLANT
SHEATH PROBE COVER 6X72 (BAG) ×1 IMPLANT
STENT RESOLUTE ONYX 2.5X12 (Permanent Stent) ×1 IMPLANT
STENT RESOLUTE ONYX 3.0X30 (Permanent Stent) ×1 IMPLANT
TRANSDUCER W/STOPCOCK (MISCELLANEOUS) ×2 IMPLANT
TUBING CIL FLEX 10 FLL-RA (TUBING) ×2 IMPLANT
WIRE COUGAR XT STRL 190CM (WIRE) ×1 IMPLANT
WIRE HI TORQ WHISPER MS 190CM (WIRE) ×1 IMPLANT

## 2020-05-05 NOTE — ED Provider Notes (Signed)
[facility]ATH [facility] Provider Note   CSN: [id number] Arrival date & time: 05/05/20  1905     History Chief Complaint  Patient presents with  . Chest Pain    Patrick Tucker is a 69 y.o. male.  Pt presents to the ED today with CP.  He said he had CP and sob this am, but it went away.  CP started again today while walking.  Pt does have a hx of CAD and has a cardiologist at [facility] whom he last saw in June.  He's been compliant with his meds.  He's been Covid vaccinated.  Last Cath:   CATH:  The left anterior descending and left circumflex have separate ostia.  The left anterior descending is a large caliber artery arising directly from the left coronary sinus. There is diffuse disease of the proximal and mid vessel up to 60-70% angiographic stenosis. The distal vessel is tortuous, has mild luminal irregularities, and wraps the apex to serve the inferior wall. There are two small diagonal branches with mild diffuse disease.  The left circumflex is a large caliber vessel arising directly from the left coronary sinus. There is 70-80% diffuse stenosis of the AV groove circumflex distal to the origin of the first obtuse marginal. There is 100% occlusion of the first obtuse marginal with thrombus and TIMI 0 flow. This is a medium caliber vessel. Post predilation, the obtuse marginal is noted to give rise to a medium caliber left posterior descending artery, which itself has diffuse 80% ostial and proximal diffuse stenosis. Post intervention, there is no residual stenosis of the obtuse marginal and 20% residual stenosis of the left posterior descending artery with TIMI III flow into both distal vessels.  The right coronary artery is a small caliber, nondominant artery with 95% ostial stenosis with hemodynamic dampening. There is severe diffuse disease up to 95% in the proximal and mid segments with TIMI II flow into the marginal branches. COMPLICATIONS: No  complications  SUMMARY FINDINGS: Successful PCI Multivessel CAD          Past Medical History:  Diagnosis Date  . Hyperlipidemia   . Hypertension 2010  . Myocardial infarction ([place]) 2015  . Prediabetes 2015    Patient Active Problem List   Diagnosis Date Noted  . Hyperlipidemia   . Vitamin D deficiency   . Former smoker   . CAD (coronary artery disease)   . PAD (peripheral artery disease) ([place])   . Hypertension 2010    History reviewed. No pertinent surgical history.     Family History  Problem Relation Age of Onset  . Cancer Mother   . Hypertension Father   . Heart disease Father   . Diabetes Sister   . Stroke Brother   . COPD Brother   . Hypertension Brother   . COPD Brother     Social History   Tobacco Use  . Smoking status: Former Research scientist (life sciences)  . Smokeless tobacco: Never Used  Vaping Use  . Vaping Use: Never used  Substance Use Topics  . Alcohol use: Not Currently  . Drug use: Not on file    Home Medications Prior to Admission medications   Medication Sig Start Date End Date Taking? Authorizing Provider  atorvastatin (LIPITOR) 80 MG tablet Take 1 tablet Daily for Cholesterol 10/10/19   Unk Pinto, MD  Bioflavonoid Products (VITAMIN C PLUS) 1000 MG TABS Take 1 tablet Daily 10/10/19   Unk Pinto, MD  CHOLECALCIFEROL PO Take 500 Units by  mouth 2 (two) times daily.    [provider]  cilostazol (PLETAL) 100 MG tablet Take 100 mg by mouth 2 (two) times daily.    [provider]  lisinopril (ZESTRIL) 2.5 MG tablet Take 1 tablet Daily for BP & Heart 10/10/19   Unk Pinto, MD  Multiple Vitamins-Minerals (MULTIVITAMIN WITH MINERALS) tablet Take 1 tablet Daily 10/10/19   Unk Pinto, MD    Allergies    Patient has no known allergies.  Review of Systems   Review of Systems  Cardiovascular: Positive for chest pain.  All other systems reviewed and are negative.   Physical Exam Updated Vital Signs BP (!) 125/93    Pulse 79   Temp (!) 97.3 F (36.3 C) (Temporal)   Resp 14   SpO2 99%   Physical Exam Vitals and nursing note reviewed.  Constitutional:      Appearance: He is well-developed.  HENT:     Head: Normocephalic and atraumatic.  Eyes:     Extraocular Movements: Extraocular movements intact.     Pupils: Pupils are equal, round, and reactive to light.  Cardiovascular:     Rate and Rhythm: Normal rate and regular rhythm.     Heart sounds: Normal heart sounds.  Pulmonary:     Effort: Pulmonary effort is normal.     Breath sounds: Normal breath sounds.  Abdominal:     General: Bowel sounds are normal.     Palpations: Abdomen is soft.  Musculoskeletal:        General: Normal range of motion.     Cervical back: Normal range of motion and neck supple.  Skin:    General: Skin is warm.     Capillary Refill: Capillary refill takes less than 2 seconds.  Neurological:     General: No focal deficit present.     Mental Status: He is alert and oriented to person, place, and time.  Psychiatric:        Mood and Affect: Mood normal.        Behavior: Behavior normal.     ED Results / Procedures / Treatments   Labs (all labs ordered are listed, but only abnormal results are displayed) Labs Reviewed  BASIC METABOLIC PANEL - Abnormal; Notable for the following components:      Result Value   Glucose, Bld 123 (*)    Creatinine, Ser 1.40 (*)    GFR calc non Af Amer 52 (*)    All other components within normal limits  HEMOGLOBIN A1C - Abnormal; Notable for the following components:   Hgb A1c MFr Bld 6.1 (*)    All other components within normal limits  LIPID PANEL - Abnormal; Notable for the following components:   Cholesterol 229 (*)    LDL Cholesterol 145 (*)    All other components within normal limits  TROPONIN I (HIGH SENSITIVITY) - Abnormal; Notable for the following components:   Troponin I (High Sensitivity) 41 (*)    All other components within normal limits  SARS CORONAVIRUS 2 BY  RT PCR (HOSPITAL ORDER, [facility])  CBC  PROTIME-INR  APTT  TROPONIN I (HIGH SENSITIVITY)    EKG EKG Interpretation  Date/Time:  Friday May 05 2020 19:28:27 EDT Ventricular Rate:  79 PR Interval:  164 QRS Duration: 85 QT Interval:  366 QTC Calculation: 420 R Axis:   58 Text Interpretation: Sinus rhythm Consider right atrial enlargement Low voltage, precordial leads Borderline repolarization abnormality ST elevation, consider lateral injury  No significant change since last tracing Confirmed by Isla Pence [phone]) on 05/05/2020 9:42:15 PM   Radiology DG Chest Portable 1 View  Result Date: 05/05/2020 CLINICAL DATA:  Chest pain and shortness of breath. EXAM: PORTABLE CHEST 1 VIEW COMPARISON:  None. FINDINGS: There is no evidence of acute infiltrate, pleural effusion or pneumothorax. The heart size and mediastinal contours are within normal limits. There is a chronic seventh left rib fracture. Multilevel degenerative changes seen within the mid and lower thoracic spine. IMPRESSION: No active disease. Electronically Signed   By: Virgina Norfolk M.D.   On: 05/05/2020 19:51    Procedures Procedures (including critical care time)  Medications Ordered in ED Medications  Heparin (Porcine) in NaCl 1000-0.9 UT/500ML-% SOLN (500 mLs  Given 05/05/20 2036)  fentaNYL (SUBLIMAZE) injection (25 mcg Intravenous Given 05/05/20 2139)  midazolam (VERSED) injection (1 mg Intravenous Given 05/05/20 2138)  lidocaine (PF) (XYLOCAINE) 1 % injection (2 mLs  Given 05/05/20 2039)  Radial Cocktail/Verapamil only (10 mLs Intra-arterial Given 05/05/20 2044)  heparin sodium (porcine) injection (2,000 Units Intravenous Given 05/05/20 2047)  bivalirudin (ANGIOMAX) BOLUS via infusion (53.7 mg Intravenous Given 05/05/20 2104)  ticagrelor (BRILINTA) tablet (180 mg Oral Given 05/05/20 2106)  bivalirudin (ANGIOMAX) 250 mg in sodium chloride 0.9 % 50 mL (5 mg/mL) infusion (1.75 mg/kg/hr  71.6 kg  Intravenous New Bag/Given 05/05/20 2107)  nitroGLYCERIN 1 mg/10 mL (100 mcg/mL) - IR/CATH LAB (200 mcg Intracoronary Given 05/05/20 2134)  sodium chloride flush (NS) 0.9 % injection 3 mL (3 mLs Intravenous Given 05/05/20 1930)  aspirin chewable tablet 324 mg (324 mg Oral Given 05/05/20 1941)  heparin injection 4,000 Units (4,000 Units Intravenous Given 05/05/20 1942)    ED Course  I have reviewed the triage vital signs and the nursing notes.  Pertinent labs & imaging results that were available during my care of the patient were reviewed by me and considered in my medical decision making (see chart for details).    MDM Rules/Calculators/A&P                          EKG was not normal, but I was not 100% sure it was a STEMI.  I spoke with cardiology who also thought it was abnormal and recommended calling a Code St[facility].  He was taken directly to the cath lab.  CRITICAL CARE Performed by: Isla Pence   Total critical care time: 30 minutes  Critical care time was exclusive of separately billable procedures and treating other patients.  Critical care was necessary to treat or prevent imminent or life-threatening deterioration.  Critical care was time spent personally by me on the following activities: development of treatment plan with patient and/or surrogate as well as nursing, discussions with consultants, evaluation of patient's response to treatment, examination of patient, obtaining history from patient or surrogate, ordering and performing treatments and interventions, ordering and review of laboratory studies, ordering and review of radiographic studies, pulse oximetry and re-evaluation of patient's condition.  Final Clinical Impression(s) / ED Diagnoses Final diagnoses:  ST elevation myocardial infarction (STEMI), unspecified artery [facility])    Rx / DC Orders ED Discharge Orders    None       Isla Pence, MD 05/05/20 2144

## 2020-05-05 NOTE — ED Triage Notes (Signed)
Pt c/o chest pain and shortness of breath, first episode started this morning and resolved, symptoms returned this evening while walking. Hx MI with stents.

## 2020-05-05 NOTE — H&P (Signed)
Cardiology Admission History and Physical:   Patient ID: [facility] MRN: [id number]; DOB: 1950-12-26   Admission date: 05/05/2020  Primary Care Provider: Unk Pinto, MD [facility]:  None   Chief Complaint:  Chest pain  Patient Profile:   [facility] is a 69 y.o. male with pmh of CAD s/p multiple stents, HTN, HLD,  PAD, tobacco abuse who presented to the ED with chest pain.   History of Present Illness:   [facility] is followed by cardiology at [facility]. History difficult to obtain from chart review. According to records he has history of CAD with prior MI with stenting to the marginal branch that was occluded and a subtotally occluded RCA. He was found to have moderate disease in the LAD. The OM was opened up at that time. In 2016 he had stress test showing inferolateral ischemia but this was asymptomatic and was treated medically. Patient has history of PAD as well with disease of SFA s/p PCI to the left which later re-occluded.   The patient presented to the ED 05/05/20 for chest pain. Pain started that morning and patient had associated sob. symptoms came back later that evening wheil he was walking. EKG obtained showed ischemic changes and CODE STEMI was called. BP 137/90, RR 15, pulse 79, afebrile. Labs pending   Past Medical History:  Diagnosis Date  . Hyperlipidemia   . Hypertension 2010  . Myocardial infarction ([place]) 2015  . Prediabetes 2015    History reviewed. No pertinent surgical history.   Medications Prior to Admission: Prior to Admission medications   Medication Sig Start Date End Date Taking? Authorizing Provider  atorvastatin (LIPITOR) 80 MG tablet Take 1 tablet Daily for Cholesterol 10/10/19   Patrick Pinto, MD  Bioflavonoid Products (VITAMIN C PLUS) 1000 MG TABS Take 1 tablet Daily 10/10/19   Patrick Pinto, MD  CHOLECALCIFEROL PO Take 500 Units by mouth 2 (two) times daily.    [provider]  cilostazol (PLETAL) 100 MG tablet Take 100 mg by mouth 2 (two) times daily.    [provider]  lisinopril (ZESTRIL) 2.5 MG tablet Take 1 tablet Daily for BP & Heart 10/10/19   Patrick Pinto, MD  Multiple Vitamins-Minerals (MULTIVITAMIN WITH MINERALS) tablet Take 1 tablet Daily 10/10/19   Patrick Pinto, MD     Allergies:   No Known Allergies  Social History:   Social History   Socioeconomic History  . Marital status: Married    Spouse name: [facility]  . Number of children: 1  . Years of education: Not on file  . Highest education level: Not on file  Occupational History  . Not on file  Tobacco Use  . Smoking status: Former Research scientist (life sciences)  . Smokeless tobacco: Never Used  Vaping Use  . Vaping Use: Never used  Substance and Sexual Activity  . Alcohol use: Not Currently  . Drug use: Not on file  . Sexual activity: Not on file  Other Topics Concern  . Not on file  Social History Narrative  . Not on file   Social Determinants of Health   Financial Resource Strain:   . Difficulty of Paying Living Expenses:   Food Insecurity:   . Worried About Charity fundraiser in the Last Year:   . Arboriculturist in the Last Year:   Transportation Needs:   . Film/video editor (Medical):   Marland Kitchen Lack of Transportation (Non-Medical):   Physical Activity:   . Days  of Exercise per Week:   . Minutes of Exercise per Session:   Stress:   . Feeling of Stress :   Social Connections:   . Frequency of Communication with Friends and Family:   . Frequency of Social Gatherings with Friends and Family:   . Attends Religious Services:   . Active Member of Clubs or Organizations:   . Attends Archivist Meetings:   Marland Kitchen Marital Status:   Intimate Partner Violence:   . Fear of Current or Ex-Partner:   . Emotionally Abused:   Marland Kitchen Physically Abused:   . Sexually Abused:     Family History:   The patient's family history includes COPD in his brother and brother; Cancer in  his mother; Diabetes in his sister; Heart disease in his father; Hypertension in his brother and father; Stroke in his brother.    ROS:  Please see the history of present illness.  Positive for hyperlipidemia, hypertension, prediabetes. All other ROS reviewed and negative.     Physical Exam/Data:   Vitals:   05/05/20 1929  BP: 137/90  Pulse: 79  Resp: 15  Temp: (!) 97.3 F (36.3 C)  TempSrc: Temporal  SpO2: 96%   No intake or output data in the 24 hours ending 05/05/20 1934 Last 3 Weights 05/01/2020 01/28/2020 01/10/2020  Weight (lbs) 157 lb 12.8 oz 151 lb 6.4 oz 153 lb 9.6 oz  Weight (kg) 71.578 kg 68.675 kg 69.673 kg     There is no height or weight on file to calculate BMI.  General:  Well nourished, well developed  HEENT: normal Lymph: no adenopathy Neck: no JVD Endocrine:  No thryomegaly Vascular: No carotid bruits; FA pulses 2+ bilaterally without bruits  Cardiac:  normal S1, S2; RRR; no murmur  Lungs:  clear to auscultation bilaterally, no wheezing, rhonchi or rales  Abd: soft, nontender, no hepatomegaly  Ext: no edema Musculoskeletal:  No deformities, BUE and BLE strength normal and equal Skin: warm and dry  Neuro:  CNs 2-12 intact, no focal abnormalities noted Psych:  Normal affect    EKG:  The ECG that was done 05/05/20 was personally reviewed and demonstrates NSR, J point elevation 1, aVL with reciprocal depression inferior leads  Relevant CV Studies:  CATH:  The left anterior descending and left circumflex have separate ostia.  The left anterior descending is a large caliber artery arising directly from the left coronary sinus. There is diffuse disease of the proximal and mid vessel up to 60-70% angiographic stenosis. The distal vessel is tortuous, has mild luminal irregularities, and wraps the apex to serve the inferior wall. There are two small diagonal branches with mild diffuse disease.  The left circumflex is a large caliber vessel arising directly  from the left coronary sinus. There is 70-80% diffuse stenosis of the AV groove circumflex distal to the origin of the first obtuse marginal. There is 100% occlusion of the first obtuse marginal with thrombus and TIMI 0 flow. This is a medium caliber vessel. Post predilation, the obtuse marginal is noted to give rise to a medium caliber left posterior descending artery, which itself has diffuse 80% ostial and proximal diffuse stenosis. Post intervention, there is no residual stenosis of the obtuse marginal and 20% residual stenosis of the left posterior descending artery with TIMI III flow into both distal vessels.  The right coronary artery is a small caliber, nondominant artery with 95% ostial stenosis with hemodynamic dampening. There is severe diffuse disease up to 95% in the  proximal and mid segments with TIMI II flow into the marginal branches. COMPLICATIONS: No complications  SUMMARY FINDINGS: Successful PCI Multivessel CAD   Laboratory Data:  High Sensitivity Troponin:  No results for input(s): TROPONINIHS in the last 720 hours.    Chemistry Recent Labs  Lab 05/01/20 1720  NA 138  K 4.4  CL 102  CO2 30  GLUCOSE 95  BUN 30*  CREATININE 1.22  CALCIUM 10.0  GFRNONAA 61  GFRAA 71    Recent Labs  Lab 05/01/20 1720  PROT 7.2  AST 23  ALT 21  BILITOT 0.3   Hematology Recent Labs  Lab 05/01/20 1720  WBC 4.4  RBC 4.51  HGB 13.9  HCT 41.5  MCV 92.0  MCH 30.8  MCHC 33.5  RDW 12.8  PLT 248   BNPNo results for input(s): BNP, PROBNP in the last 168 hours.  DDimer No results for input(s): DDIMER in the last 168 hours.   Radiology/Studies:  No results found.    Assessment and Plan:   CODE STEMI - pt with history of CAD with multiple stents presents with chest pain. EKG showed j point elevation in I, aVL with reciprocal depression inferior leads. CODE stemi was called. COVID pending - give ASA 325mg  once - start Aspirin. Continue statin,  lisinopril - check troponin, BMET, CBC - check echo - Admit and plan for emergent cath  HTN - lisinopril 2.5mg  daily - pressures reasonable  HLD - lipid panel pending - continue atorvastatin 80 mg daily  PAD s/p L SFA intervention - Pletal 100 mg  Severity of Illness: The appropriate patient status for this patient is INPATIENT. Inpatient status is judged to be reasonable and necessary in order to provide the required intensity of service to ensure the patient's safety. The patient's presenting symptoms, physical exam findings, and initial radiographic and laboratory data in the context of their chronic comorbidities is felt to place them at high risk for further clinical deterioration. Furthermore, it is not anticipated that the patient will be medically stable for discharge from the [facility] within 2 midnights of admission. The following factors support the patient status of inpatient.   " The patient's presenting symptoms include ACS/early STEMI " The worrisome physical exam findings include . " The initial radiographic and laboratory data are worrisome because of . " The chronic co-morbidities include CAD, HTN.   * I certify that at the point of admission it is my clinical judgment that the patient will require inpatient [facility] care spanning beyond 2 midnights from the point of admission due to high intensity of service, high risk for further deterioration and high frequency of surveillance required.*    For questions or updates, please contact [place] Please consult [email or web address] for contact info under     Signed, Cadence Ninfa Meeker, PA-C  05/05/2020 7:34 PM    Patient seen and examined. Agree with assessment and plan.  [facility] is a 69 year old African-American male who had previously undergone cardiac catheterization following an MI at [facility] and underwent stenting of an occluded circumflex marginal vessel.  At the time he had moderate disease in the  proximal LAD and 90 to 95% stenosis in a small nondominant right coronary artery.  The patient had been doing well.  Today he experienced development of recurrent chest pressure with activity.  This evening while walking to the [employer] [facility] baseball game he developed severe substernal chest pressure and felt like a person was sitting on his chest.  He  became short of breath.  He ultimately presented to the emergency room where his ECG showed early ST segment elevation in leads I aVL and new deeply inverted inferolateral T wave abnormalities.  A code STEMI was activated.  In the emergency room he was given heparin 4000 units with improvement in his chest pain as well as his ECG changes.  However with his symptom complex worrisome for acute coronary syndrome I discussed the need for emergent cardiac catheterization.  Once pain-free he looked comfortable.  There was no JVD.  Lungs revealed decreased breath sounds at the bases.  Rhythm was regular with no S3 gallop.  Abdomen was soft nontender.  Pulses were adequate.  Of note, the patient also has a history of PVD and status post PCI to the left SFA with subsequent occlusion.  Discussed the cardiac catheterization procedure with him in detail.  I also called his wife and explained the need for emergent cath.  Both he and his wife agree to pursue this evaluation.   Troy Sine, MD, [facility] 05/05/2020 11:08 PM

## 2020-05-06 ENCOUNTER — Inpatient Hospital Stay (HOSPITAL_COMMUNITY): Payer: Medicare HMO

## 2020-05-06 DIAGNOSIS — I249 Acute ischemic heart disease, unspecified: Secondary | ICD-10-CM

## 2020-05-06 DIAGNOSIS — I10 Essential (primary) hypertension: Secondary | ICD-10-CM

## 2020-05-06 LAB — BASIC METABOLIC PANEL
Anion gap: 10 (ref 5–15)
BUN: 24 mg/dL — ABNORMAL HIGH (ref 8–23)
CO2: 23 mmol/L (ref 22–32)
Calcium: 9.3 mg/dL (ref 8.9–10.3)
Chloride: 102 mmol/L (ref 98–111)
Creatinine, Ser: 1.12 mg/dL (ref 0.61–1.24)
GFR calc Af Amer: >60 mL/min (ref >60)
GFR calc non Af Amer: >60 mL/min (ref >60)
Glucose, Bld: 131 mg/dL — ABNORMAL HIGH (ref 70–99)
Potassium: 3.7 mmol/L (ref 3.5–5.1)
Sodium: 135 mmol/L (ref 135–145)

## 2020-05-06 LAB — ECHOCARDIOGRAM COMPLETE
AR max vel: 2.39 cm2
AV Area VTI: 2.24 cm2
AV Area mean vel: 2.22 cm2
AV Mean grad: 2 mmHg
AV Peak grad: 4.2 mmHg
Ao pk vel: 1.02 m/s
Area-P 1/2: 3.72 cm2
Height: 69 in
S' Lateral: 3.4 cm
Weight: 2497.37 oz

## 2020-05-06 LAB — TROPONIN I (HIGH SENSITIVITY)
Troponin I (High Sensitivity): 578 ng/L (ref <18)
Troponin I (High Sensitivity): 662 ng/L (ref <18)

## 2020-05-06 LAB — MRSA PCR SCREENING: MRSA by PCR: NEGATIVE

## 2020-05-06 LAB — CBC
HCT: 38.5 % — ABNORMAL LOW (ref 39.0–52.0)
Hemoglobin: 12.9 g/dL — ABNORMAL LOW (ref 13.0–17.0)
MCH: 30 pg (ref 26.0–34.0)
MCHC: 33.5 g/dL (ref 30.0–36.0)
MCV: 89.5 fL (ref 80.0–100.0)
Platelets: 244 10*3/uL (ref 150–400)
RBC: 4.3 MIL/uL (ref 4.22–5.81)
RDW: 13 % (ref 11.5–15.5)
WBC: 5.1 10*3/uL (ref 4.0–10.5)
nRBC: 0 % (ref 0.0–0.2)

## 2020-05-06 MED ORDER — METOPROLOL SUCCINATE ER 25 MG PO TB24
25.0000 mg | ORAL_TABLET | Freq: Every day | ORAL | Status: DC
  Administered 2020-05-06 – 2020-05-07 (×2): 25 mg via ORAL
  Filled 2020-05-06 (×2): qty 1

## 2020-05-06 MED ORDER — CHLORHEXIDINE GLUCONATE CLOTH 2 % EX PADS
6.0000 | MEDICATED_PAD | Freq: Every day | CUTANEOUS | Status: DC
  Administered 2020-05-06: 6 via TOPICAL

## 2020-05-06 MED ORDER — PERFLUTREN LIPID MICROSPHERE
1.0000 mL | INTRAVENOUS | Status: AC | PRN
  Administered 2020-05-06: 3 mL via INTRAVENOUS
  Filled 2020-05-06: qty 10

## 2020-05-06 MED ORDER — LOSARTAN POTASSIUM 25 MG PO TABS
25.0000 mg | ORAL_TABLET | Freq: Every day | ORAL | Status: DC
  Administered 2020-05-06 – 2020-05-07 (×2): 25 mg via ORAL
  Filled 2020-05-06 (×2): qty 1

## 2020-05-06 NOTE — Progress Notes (Signed)
Progress Note   Subjective   Doing well today, the patient denies CP or SOB.  He feels great and wants to go home. No new concerns  Inpatient Medications    Scheduled Meds: . aspirin  81 mg Oral Daily  . atorvastatin  80 mg Oral Daily  . Chlorhexidine Gluconate Cloth  6 each Topical Daily  . metoprolol tartrate  12.5 mg Oral BID  . sodium chloride flush  3 mL Intravenous Q12H  . ticagrelor  90 mg Oral BID   Continuous Infusions: . sodium chloride     PRN Meds: sodium chloride, acetaminophen, diazepam, ondansetron (ZOFRAN) IV, sodium chloride flush   Vital Signs    Vitals:   05/06/20 0630 05/06/20 0700 05/06/20 0745 05/06/20 0906  BP: (!) 144/80 (!) 161/98  (!) 142/85  Pulse: 68 82  68  Resp: 10 20    Temp:   99 F (37.2 C)   TempSrc:   Oral   SpO2: 95% 95%    Weight:      Height:        Intake/Output Summary (Last 24 hours) at 05/06/2020 1014 Last data filed at 05/06/2020 0700 Gross per 24 hour  Intake 589.62 ml  Output 675 ml  Net -85.38 ml   Filed Weights   05/05/20 2224  Weight: 70.8 kg    Telemetry    sinus - Personally Reviewed  Physical Exam   GEN- The patient is well appearing, alert and oriented x 3 today.   Head- normocephalic, atraumatic Eyes-  Sclera clear, conjunctiva pink Ears- hearing intact Oropharynx- clear Neck- supple, Lungs-  normal work of breathing Heart- Regular rate and rhythm  GI- soft  Extremities- no clubbing, cyanosis, or edema  MS- no significant deformity or atrophy Skin- no rash or lesion Psych- euthymic mood, full affect Neuro- strength and sensation are intact   Labs    Chemistry Recent Labs  Lab 05/01/20 1720 05/05/20 1923 05/06/20 0113  NA 138 137 135  K 4.4 5.1 3.7  CL 102 99 102  CO2 30 28 23   GLUCOSE 95 123* 131*  BUN 30* 23 24*  CREATININE 1.22 1.40* 1.12  CALCIUM 10.0 9.9 9.3  PROT 7.2  --   --   AST 23  --   --   ALT 21  --   --   BILITOT 0.3  --   --   GFRNONAA 61 52* >60  GFRAA 71  >60 >60  ANIONGAP  --  10 10     Hematology Recent Labs  Lab 05/01/20 1720 05/05/20 1923 05/06/20 0113  WBC 4.4 4.9 5.1  RBC 4.51 4.78 4.30  HGB 13.9 15.1 12.9*  HCT 41.5 43.3 38.5*  MCV 92.0 90.6 89.5  MCH 30.8 31.6 30.0  MCHC 33.5 34.9 33.5  RDW 12.8 13.2 13.0  PLT 248 304 244     Patient ID  [facility] is a 69 y.o. male with pmh of CAD s/p multiple stents, HTN, HLD,  PAD, tobacco abuse who presented to the ED with acute STEMI.  S/p PCI of 95% proximal LAD and 95% near ostial LCx.  Assessment & Plan    1.  Acute STEMI Doing well s/p PCI Continue aggressive medical therapy going forward Convert metoprolol to toprol Add losartan Echo pending Cardiac rehab encouraged  2. HL Continue high intensity atorvastatin  3. HTN Elevated Medicine changes as above  Transfer to telemetry Hopefully home tomorrow with close outpatient follow-up  Thompson Grayer MD, [facility]  05/06/2020 10:14 AM

## 2020-05-06 NOTE — Plan of Care (Signed)
Pt admitted to [facility] after STEMI. Pt on IVF, voids, and has TR band. Will continue to monitor labs and vitals.

## 2020-05-06 NOTE — Progress Notes (Signed)
CARDIAC REHAB PHASE I   PRE:  Rate/Rhythm: 86 NSR    BP: sitting 165/88    SaO2: 96 RA  MODE:  Ambulation: 1110 ft (3 laps around unit)  POST:  Rate/Rhythm: 82 NSR    BP: sitting 152/102 (primary RN aware. Patient has not had BP meds this morning)     SaO2: 99 RA  0804-0900 Patient ambulated independently and able to manipulate IV pool without assistance.  Steady gait noted. Denied complaints. VSS. Post ambulation patient requested to return to bed as he did not sleep well last night. Education completed including CAD/MI risk factors, exercise guidelines, Angina/NTG, restrictions and nutrition. Patient provided and reviewed MI booklet. Stent card given. Patient interested in phase 2 cardiac rehab at [facility]. With patient permission, referral placed. Patient encouraged to ambulate at least 1 more time today.  Adylynn Hertenstein Minus Breeding RN, BSN

## 2020-05-06 NOTE — Progress Notes (Signed)
  Echocardiogram 2D Echocardiogram has been performed with Definity.  [facility] 05/06/2020, 10:52 AM

## 2020-05-07 DIAGNOSIS — I249 Acute ischemic heart disease, unspecified: Secondary | ICD-10-CM | POA: Diagnosis not present

## 2020-05-07 MED ORDER — METOPROLOL SUCCINATE ER 25 MG PO TB24: 25.0000 mg | ORAL_TABLET | Freq: Every day | ORAL | 3 refills | Status: DC

## 2020-05-07 MED ORDER — TICAGRELOR 90 MG PO TABS: 90.0000 mg | ORAL_TABLET | Freq: Two times a day (BID) | ORAL | 3 refills | Status: DC

## 2020-05-07 MED ORDER — LOSARTAN POTASSIUM 25 MG PO TABS: 25.0000 mg | ORAL_TABLET | Freq: Every day | ORAL | 3 refills | Status: DC

## 2020-05-07 MED ORDER — NITROGLYCERIN 0.4 MG SL SUBL
0.4000 mg | SUBLINGUAL_TABLET | SUBLINGUAL | 2 refills | Status: DC | PRN
Start: 2020-05-07 — End: 2023-07-09

## 2020-05-07 MED ORDER — ASPIRIN EC 81 MG PO TBEC
81.0000 mg | DELAYED_RELEASE_TABLET | Freq: Every day | ORAL | 2 refills | Status: AC
Start: 2020-05-07 — End: 2021-05-07

## 2020-05-07 NOTE — Discharge Summary (Addendum)
Discharge Summary    Patient ID: Garlen Reinig MRN: [id number]; DOB: Feb 16, 1951  Admit date: 05/05/2020 Discharge date: 05/07/2020  Primary Care Provider: Unk Pinto, MD  Primary Cardiologist: Shelva Majestic, MD with [facility], primarily follows with [facility] cardiology Primary Electrophysiologist:  None   Discharge Diagnoses    Principal Problem:   ST elevation myocardial infarction (STEMI) [facility]) Active Problems:   Hypertension   Hyperlipidemia   Former smoker   CAD (coronary artery disease)   PAD (peripheral artery disease) ([place])   ACS (acute coronary syndrome) [facility])    Diagnostic Studies/Procedures    Left heart cath 05/05/20  1st Mrg-1 lesion is 95% stenosed.  Previously placed 1st Mrg-2 stent (unknown type) is widely patent.  Prox LAD to Mid LAD lesion is 95% stenosed.  Post intervention, there is a 0% residual stenosis.  Prox RCA to Mid RCA lesion is 90% stenosed.  Post intervention, there is a 0% residual stenosis.  A stent was successfully placed.  A stent was successfully placed.   Transient ST elevation MI/acute coronary syndrome involving two culprit vessels with a 95% proximal LAD stenosis in a large LAD system which supplies almost the entire inferior wall and a 95% near ostial stenosis in the circumflex marginal vessel just proximal to the previously placed stent;  75% diffuse AV groove circumflex stenosis and the RCA is a nondominant vessel with diffuse 90% mid stenosis.  Successful two-vessel coronary intervention following the LAD with ultimate insertion of a 3.0 x 30 mm Resolute Onyx DES stent postdilated to 3.21 mm with the 95% stenosis and diffuse irregularity beyond the stenosis being reduced to 0%, and successful PCI/DES stenting of the 95% stenosis just beyond the ostium and immediately proximal to the previously placed marginal stent with insertion of a 2.5 x 12 mm Resolute Onyx DES stent postdilated to 2.6 mm with the 95% stenosis being reduced  to 0%.  Elevated LVEDP at 33 mm.  RECOMMENDATION: DAPT for minimum of 1 year.  Medical therapy for concomitant CAD involving the AV groove circumflex and nondominant RCA.  Aggressive lipid-lowering therapy with target LDL less than 70.  2D echo Doppler study will be obtained in a.m.  _____________   Echo 05/06/20: 1. Definity contrast did not reveal any evidence of apical thrombi.  Left ventricular ejection fraction, by estimation, is 30 to 35%. The left  ventricle has moderately decreased function. The left ventricle has no  regional wall motion abnormalities.  Left ventricular diastolic parameters are consistent with Grade II  diastolic dysfunction (pseudonormalization). There is severe akinesis of  the left ventricular, mid-apical anteroseptal wall.  2. Right ventricular systolic function is normal. The right ventricular  size is normal. There is normal pulmonary artery systolic pressure.  3. The mitral valve is grossly normal. Trivial mitral valve  regurgitation. No evidence of mitral stenosis.  4. The aortic valve is tricuspid. Aortic valve regurgitation is not  visualized. No aortic stenosis is present.   History of Present Illness     Josejulian Tarango is a 69 y.o. male with pmh of CAD s/p multiple stents, HTN, HLD,  PAD, tobacco abuse who presented to the ED with chest pain.  Mr. Denz is followed by cardiology at [facility]. History difficult to obtain from chart review. According to records he has history of CAD with prior MI with stenting to the marginal branch that was occluded and a subtotally occluded RCA. He was found to have moderate disease in the LAD. The OM was opened up at  that time. In 2016 he had stress test showing inferolateral ischemia but this was asymptomatic and was treated medically. Patient has history of PAD as well with disease of SFA s/p PCI to the left which later re-occluded.   The patient presented to the ED 05/05/20 for chest pain. Pain started that  morning and patient had associated sob. symptoms came back later that evening wheil he was walking. EKG obtained showed ischemic changes and CODE STEMI was called. BP 137/90, RR 15, pulse 79, afebrile. Labs pending   Hospital Course     Consultants: none  Acute STEMI CODE STEMI activated and patient was taken emergently to the cath lab. Angiography revealed 95% stenosis in the mid LAD, which is a large vessel that supplies almost the entire inferior wall successfully treated with DES. He also as 95% near ostial stenosis in the Cx marginal vessel just proximal previously placed stent. This was successfully treated with DES. Small caliber RCA with 90% stenosis will be treated medically. HS troponin peaked at 662. He tolerated the procedure well. He was started on ASA and briltina.    Ischemic cardiomyopathy Systolic heart failure - new onset Echo this admission with EF 30-35%.  Prior echo summarized in [facility] cardiology note dated 08/06/19 as "low normal LVEF of 50%." Suspect an ischemic cardiomyopathy. Case discussed with Dr. Acie Fredrickson and he does not recommend LifeVest at this time. Patient has been instructed to follow up with his primary cardiologist at [facility] for titration of medications and repeat echo in 3 months.    Hypertension Titrate losartan as able in OP setting.    Hyperlipidemia with LDL goal < 70 05/05/2020: Cholesterol 229; HDL 59; LDL Cholesterol 145; Triglycerides 123; VLDL 25 Continue 80 mg lipitor and zetia. Will need to consider PCSK9i.    PAD s/p L SFA intervention (2018) Has been on pletal. I have held this in the setting of DAPT. Will defer to primary cardiologist for timing to resume. Consider 2.5 mg xarelto.   Pt seen and examined by Dr. Acie Fredrickson and deemed stable for discharge.  Instructed to follow up with primary cardiologist at [facility].    Did the patient have an acute coronary syndrome (MI, NSTEMI, STEMI, etc) this admission?:  Yes                                AHA/ACC Clinical Performance & Quality Measures: 1. Aspirin prescribed? - Yes 2. ADP Receptor Inhibitor (Plavix/Clopidogrel, Brilinta/Ticagrelor or Effient/Prasugrel) prescribed (includes medically managed patients)? - Yes 3. Beta Blocker prescribed? - Yes 4. High Intensity Statin (Lipitor 40-80mg  or Crestor 20-40mg ) prescribed? - Yes 5. EF assessed during THIS hospitalization? - Yes 6. For EF <40%, was ACEI/ARB prescribed? - Yes 7. For EF <40%, Aldosterone Antagonist (Spironolactone or Eplerenone) prescribed? - No - Reason:  marginal pressure 8. Cardiac Rehab Phase II ordered (including medically managed patients)? - Yes   _____________  Discharge Vitals Blood pressure 131/84, pulse 60, temperature 97.9 F (36.6 C), temperature source Oral, resp. rate 15, height 5\' 9"  (1.753 m), weight 69.6 kg, SpO2 98 %.  Filed Weights   05/05/20 2224 05/06/20 1221 05/07/20 0321  Weight: 70.8 kg 70.4 kg 69.6 kg    Labs & Radiologic Studies    CBC Recent Labs    05/05/20 1923 05/06/20 0113  WBC 4.9 5.1  HGB 15.1 12.9*  HCT 43.3 38.5*  MCV 90.6 89.5  PLT 304 810   Basic Metabolic  Panel Recent Labs    05/05/20 1923 05/06/20 0113  NA 137 135  K 5.1 3.7  CL 99 102  CO2 28 23  GLUCOSE 123* 131*  BUN 23 24*  CREATININE 1.40* 1.12  CALCIUM 9.9 9.3   Liver Function Tests No results for input(s): AST, ALT, ALKPHOS, BILITOT, PROT, ALBUMIN in the last 72 hours. No results for input(s): LIPASE, AMYLASE in the last 72 hours. High Sensitivity Troponin:   Recent Labs  Lab 05/05/20 1923 05/05/20 2316 05/06/20 0113  TROPONINIHS 41* 578* 662*    BNP Invalid input(s): POCBNP D-Dimer No results for input(s): DDIMER in the last 72 hours. Hemoglobin A1C Recent Labs    05/05/20 1935  HGBA1C 6.1*   Fasting Lipid Panel Recent Labs    05/05/20 1923  CHOL 229*  HDL 59  LDLCALC 145*  TRIG 123  CHOLHDL 3.9   Thyroid Function Tests No results for input(s): TSH, T4TOTAL, T3FREE,  THYROIDAB in the last 72 hours.  Invalid input(s): FREET3 _____________  CARDIAC CATHETERIZATION  Result Date: 05/05/2020  1st Mrg-1 lesion is 95% stenosed.  Previously placed 1st Mrg-2 stent (unknown type) is widely patent.  Prox LAD to Mid LAD lesion is 95% stenosed.  Post intervention, there is a 0% residual stenosis.  Prox RCA to Mid RCA lesion is 90% stenosed.  Post intervention, there is a 0% residual stenosis.  A stent was successfully placed.  A stent was successfully placed.  Transient ST elevation MI/acute coronary syndrome involving two culprit vessels with a 95% proximal LAD stenosis in a large LAD system which supplies almost the entire inferior wall and a 95% near ostial stenosis in the circumflex marginal vessel just proximal to the previously placed stent;  75% diffuse AV groove circumflex stenosis and the RCA is a nondominant vessel with diffuse 90% mid stenosis. Successful two-vessel coronary intervention following the LAD with ultimate insertion of a 3.0 x 30 mm Resolute Onyx DES stent postdilated to 3.21 mm with the 95% stenosis and diffuse irregularity beyond the stenosis being reduced to 0%, and successful PCI/DES stenting of the 95% stenosis just beyond the ostium and immediately proximal to the previously placed marginal stent with insertion of a 2.5 x 12 mm Resolute Onyx DES stent postdilated to 2.6 mm with the 95% stenosis being reduced to 0%. Elevated LVEDP at 33 mm. RECOMMENDATION: DAPT for minimum of 1 year.  Medical therapy for concomitant CAD involving the AV groove circumflex and nondominant RCA.  Aggressive lipid-lowering therapy with target LDL less than 70.  2D echo Doppler study will be obtained in a.m.   DG Chest Portable 1 View  Result Date: 05/05/2020 CLINICAL DATA:  Chest pain and shortness of breath. EXAM: PORTABLE CHEST 1 VIEW COMPARISON:  None. FINDINGS: There is no evidence of acute infiltrate, pleural effusion or pneumothorax. The heart size and  mediastinal contours are within normal limits. There is a chronic seventh left rib fracture. Multilevel degenerative changes seen within the mid and lower thoracic spine. IMPRESSION: No active disease. Electronically Signed   By: Virgina Norfolk M.D.   On: 05/05/2020 19:51   ECHOCARDIOGRAM COMPLETE  Result Date: 05/06/2020    ECHOCARDIOGRAM REPORT   Patient Name:   OPAL MCKELLIPS Date of Exam: 05/06/2020 Medical Rec #:  [id number]     Height:       69.0 in Accession #:    [id number]    Weight:       156.1 lb Date of Birth:  May 04, 1951  BSA:          1.859 m Patient Age:    69 years      BP:           142/85 mmHg Patient Gender: M             HR:           68 bpm. Exam Location:  Inpatient Procedure: 2D Echo, Cardiac Doppler and Color Doppler Indications:    Acute Cornonary Syndrome  History:        Patient has no prior history of Echocardiogram examinations. CAD                 and Previous Myocardial Infarction; Risk Factors:Hypertension                 and Dyslipidemia.  Sonographer:    Clayton Lefort RDCS (AE) Referring Phys: [place]  1. Definity contrast did not reveal any evidence of apical thrombi. . Left ventricular ejection fraction, by estimation, is 30 to 35%. The left ventricle has moderately decreased function. The left ventricle has no regional wall motion abnormalities. Left ventricular diastolic parameters are consistent with Grade II diastolic dysfunction (pseudonormalization). There is severe akinesis of the left ventricular, mid-apical anteroseptal wall.  2. Right ventricular systolic function is normal. The right ventricular size is normal. There is normal pulmonary artery systolic pressure.  3. The mitral valve is grossly normal. Trivial mitral valve regurgitation. No evidence of mitral stenosis.  4. The aortic valve is tricuspid. Aortic valve regurgitation is not visualized. No aortic stenosis is present. FINDINGS  Left Ventricle: Definity contrast did not reveal any  evidence of apical thrombi. Left ventricular ejection fraction, by estimation, is 30 to 35%. The left ventricle has moderately decreased function. The left ventricle has no regional wall motion abnormalities. Severe akinesis of the left ventricular, mid-apical anteroseptal wall. Definity contrast agent was given IV to delineate the left ventricular endocardial borders. The left ventricular internal cavity size was normal in size. There is no left ventricular hypertrophy. Left ventricular diastolic parameters are consistent with Grade II diastolic dysfunction (pseudonormalization). Right Ventricle: The right ventricular size is normal. No increase in right ventricular wall thickness. Right ventricular systolic function is normal. There is normal pulmonary artery systolic pressure. The tricuspid regurgitant velocity is 2.65 m/s, and  with an assumed right atrial pressure of 3 mmHg, the estimated right ventricular systolic pressure is 17.4 mmHg. Left Atrium: Left atrial size was normal in size. Right Atrium: Right atrial size was normal in size. Pericardium: There is no evidence of pericardial effusion. Mitral Valve: The mitral valve is grossly normal. Trivial mitral valve regurgitation. No evidence of mitral valve stenosis. MV peak gradient, 2.7 mmHg. The mean mitral valve gradient is 1.0 mmHg. Tricuspid Valve: The tricuspid valve is grossly normal. Tricuspid valve regurgitation is trivial. Aortic Valve: The aortic valve is tricuspid. Aortic valve regurgitation is not visualized. No aortic stenosis is present. Aortic valve mean gradient measures 2.0 mmHg. Aortic valve peak gradient measures 4.2 mmHg. Aortic valve area, by VTI measures 2.24 cm. Pulmonic Valve: The pulmonic valve was grossly normal. Pulmonic valve regurgitation is not visualized. Aorta: The aortic root and ascending aorta are structurally normal, with no evidence of dilitation. IAS/Shunts: The atrial septum is grossly normal.  LEFT VENTRICLE PLAX 2D  LVIDd:         4.30 cm  Diastology LVIDs:         3.40 cm  LV  e' lateral:   8.59 cm/s LV PW:         1.10 cm  LV E/e' lateral: 7.3 LV IVS:        0.90 cm  LV e' medial:    6.09 cm/s LVOT diam:     1.80 cm  LV E/e' medial:  10.2 LV SV:         49 LV SV Index:   26 LVOT Area:     2.54 cm  RIGHT VENTRICLE            IVC RV Basal diam:  2.90 cm    IVC diam: 1.50 cm RV S prime:     7.25 cm/s TAPSE (M-mode): 1.9 cm LEFT ATRIUM             Index       RIGHT ATRIUM           Index LA diam:        3.10 cm 1.67 cm/m  RA Area:     10.70 cm LA Vol (A2C):   22.9 ml 12.32 ml/m RA Volume:   24.20 ml  13.01 ml/m LA Vol (A4C):   25.7 ml 13.82 ml/m LA Biplane Vol: 24.8 ml 13.34 ml/m  AORTIC VALVE AV Area (Vmax):    2.39 cm AV Area (Vmean):   2.22 cm AV Area (VTI):     2.24 cm AV Vmax:           102.00 cm/s AV Vmean:          69.200 cm/s AV VTI:            0.218 m AV Peak Grad:      4.2 mmHg AV Mean Grad:      2.0 mmHg LVOT Vmax:         95.60 cm/s LVOT Vmean:        60.300 cm/s LVOT VTI:          0.192 m LVOT/AV VTI ratio: 0.88  AORTA Ao Root diam: 3.00 cm Ao Asc diam:  2.70 cm MITRAL VALVE               TRICUSPID VALVE MV Area (PHT): 3.72 cm    TR Peak grad:   28.1 mmHg MV Peak grad:  2.7 mmHg    TR Vmax:        265.00 cm/s MV Mean grad:  1.0 mmHg MV Vmax:       0.82 m/s    SHUNTS MV Vmean:      40.8 cm/s   Systemic VTI:  0.19 m MV Decel Time: 204 msec    Systemic Diam: 1.80 cm MV E velocity: 62.40 cm/s MV A velocity: 39.40 cm/s MV E/A ratio:  1.58 Mertie Moores MD Electronically signed by Mertie Moores MD Signature Date/Time: 05/06/2020/1:40:30 PM    Final    Disposition   Pt is being discharged home today in good condition.  Follow-up Plans & Appointments     Follow-up [place] Follow up in 1 week(s).   Contact information: [place] [address] [phone]              Discharge Instructions    Amb Referral to Cardiac Rehabilitation   Complete by: As  directed    Diagnosis:  Coronary Stents STEMI     After initial evaluation and assessments completed: Virtual Based Care may be provided alone or in conjunction with Phase 2 Cardiac Rehab based  on patient barriers.: Yes   Diet - low sodium heart healthy   Complete by: As directed    Discharge instructions   Complete by: As directed    No driving for 1 week. No lifting over 5 lbs for 1 week. No sexual activity for 1 week. You may return to work in 1 week. Keep procedure site clean & dry. If you notice increased pain, swelling, bleeding or pus, call/return!  You may shower, but no soaking baths/hot tubs/pools for 1 week.   Increase activity slowly   Complete by: As directed       Discharge Medications   Allergies as of 05/07/2020   No Known Allergies     Medication List    STOP taking these medications   aspirin 81 MG chewable tablet Replaced by: aspirin EC 81 MG tablet   carvedilol 6.25 MG tablet Commonly known as: COREG   lisinopril 2.5 MG tablet Commonly known as: ZESTRIL   lisinopril-hydrochlorothiazide 20-12.5 MG tablet Commonly known as: ZESTORETIC     TAKE these medications   ascorbic acid 1000 MG tablet Commonly known as: VITAMIN C Take 1 tablet by mouth daily.   aspirin EC 81 MG tablet Take 1 tablet (81 mg total) by mouth daily. Swallow whole. Replaces: aspirin 81 MG chewable tablet   atorvastatin 80 MG tablet Commonly known as: LIPITOR Take 1 tablet Daily for Cholesterol   ezetimibe 10 MG tablet Commonly known as: ZETIA Take 10 mg by mouth daily.   losartan 25 MG tablet Commonly known as: COZAAR Take 1 tablet (25 mg total) by mouth daily. Start taking on: May 08, 2020   metoprolol succinate 25 MG 24 hr tablet Commonly known as: TOPROL-XL Take 1 tablet (25 mg total) by mouth daily. Start taking on: May 08, 2020   multivitamin with minerals tablet Take 1 tablet Daily What changed:   how much to take  how to take this  when to take  this  additional instructions   nitroGLYCERIN 0.4 MG SL tablet Commonly known as: Nitrostat Place 1 tablet (0.4 mg total) under the tongue every 5 (five) minutes as needed for chest pain.   ticagrelor 90 MG Tabs tablet Commonly known as: BRILINTA Take 1 tablet (90 mg total) by mouth 2 (two) times daily.   Vitamin C Plus 1000 MG Tabs Take 1 tablet Daily   Vitamin D-3 125 MCG (5000 UT) Tabs Take 1 tablet by mouth daily.          Outstanding Labs/Studies   Follow with [facility] cards  PCSKi  Duration of Discharge Encounter   Greater than 30 minutes including physician time.  Signed, [place], PA 05/07/2020, 12:37 PM  Attending Note:   The patient was seen and examined.  Agree with assessment and plan as noted above.  Changes made to the above note as needed.  Patient seen and independently examined with  Doreene Adas, PA .   We discussed all aspects of the encounter. I agree with the assessment and plan as stated above.  1.  Coronary artery disease: He status post PCI of the LAD and left circumflex artery.  He is doing well. Continue aspirin and Brilinta for at least 1 year.  At that point consider stopping the aspirin and continuing Brilinta long-term.  2.  Hyperlipidemia:    LDL is 145.  Cont atorvastatin  OK for DC  Follow up at [facility]   I have spent a total of 40 minutes with patient  reviewing hospital  notes , telemetry, EKGs, labs and examining patient as well as establishing an assessment and plan that was discussed with the patient. > 50% of time was spent in direct patient care.     Thayer Headings, Brooke Bonito., MD, [facility] 05/08/2020, 2:21 PM [address]  [place] Pager [phone]

## 2020-05-07 NOTE — Discharge Instructions (Signed)
Heart Attack A heart attack occurs when blood and oxygen supply to the heart is cut off. A heart attack causes damage to the heart that cannot be fixed. A heart attack is also called a myocardial infarction, or MI. If you think you are having a heart attack, do not wait to see if the symptoms will go away. Get medical help right away. What are the causes? This condition may be caused by:  A fatty substance (plaque) in the blood vessels (arteries). This can block the flow of blood to the heart.  A blood clot in the blood vessels that go to the heart. The blood clot blocks blood flow.  Low blood pressure.  An abnormal heartbeat.  Some diseases, such as problems in red blood cells (anemia)orproblems in breathing (respiratory failure).  Tightening (spasm) of a blood vessel that cuts off blood to the heart.  A tear in a blood vessel of the heart.  High blood pressure. What increases the risk? The following factors may make you more likely to develop this condition:  Aging. The older you are, the higher your risk.  Having a personal or family history of chest pain, heart attack, stroke, or narrowing of the arteries in the legs, arms, head, or stomach (peripheral artery disease).  Being male.  Smoking.  Not getting regular exercise.  Being overweight or obese.  Having high blood pressure.  Having high cholesterol.  Having diabetes.  Drinking too much alcohol.  Using illegal drugs, such as cocaine or methamphetamine. What are the signs or symptoms? Symptoms of this condition include:  Chest pain. It may feel like: ? Crushing or squeezing. ? Tightness, pressure, fullness, or heaviness.  Pain in the arm, neck, jaw, back, or upper body.  Shortness of breath.  Heartburn.  Upset stomach (indigestion).  Feeling like you may vomit (nauseous).  Cold sweats.  Feeling tired.  Sudden light-headedness. How is this treated? A heart attack must be treated as soon as  possible. Treatment may include:  Medicines to: ? Break up or dissolve blood clots. ? Thin blood and help prevent blood clots. ? Treat blood pressure. ? Improve blood flow to the heart. ? Reduce pain. ? Reduce cholesterol.  Procedures to widen a blocked artery and keep it open.  Open heart surgery.  Receiving oxygen.  Making your heart strong again (cardiac rehabilitation) through exercise, education, and counseling. Follow these instructions at home: Medicines  Take over-the-counter and prescription medicines only as told by your doctor. You may need to take medicine: ? To keep your blood from clotting too easily. ? To control blood pressure. ? To lower cholesterol. ? To control heart rhythms.  Do not take these medicines unless your doctor says it is okay: ? NSAIDs, such as ibuprofen. ? Supplements that have vitamin A, vitamin E, or both. ? Hormone replacement therapy that has estrogen with or without progestin. Lifestyle      Do not use any products that have nicotine or tobacco, such as cigarettes, e-cigarettes, and chewing tobacco. If you need help quitting, ask your doctor.  Avoid secondhand smoke.  Exercise regularly. Ask your doctor about a cardiac rehab program.  Eat heart-healthy foods. Your doctor will tell you what foods to eat.  Stay at a healthy weight.  Lower your stress level.  Do not use illegal drugs. Alcohol use  Do not drink alcohol if: ? Your doctor tells you not to drink. ? You are pregnant, may be pregnant, or are planning to become pregnant.    If you drink alcohol: ? Limit how much you use to:  0-1 drink a day for women.  0-2 drinks a day for men. ? Know how much alcohol is in your drink. In the [place] one drink equals one 12 oz bottle of beer (355 mL), one 5 oz glass of wine (148 mL), or one 1 oz glass of hard liquor (44 mL). General instructions  Work with your doctor to treat other problems you may have, such as diabetes or high  blood pressure.  Get screened for depression. Get treatment if needed.  Keep your vaccines up to date. Get the flu shot (influenza vaccine) every year.  Keep all follow-up visits as told by your doctor. This is important. Contact a doctor if:  You feel very sad.  You have trouble doing your daily activities. Get help right away if:  You have sudden, unexplained discomfort in your chest, arms, back, neck, jaw, or upper body.  You have shortness of breath.  You have sudden sweating or clammy skin.  You feel like you may vomit.  You vomit.  You feel tired or weak.  You get light-headed or dizzy.  You feel your heart beating fast.  You feel your heart skipping beats.  You have blood pressure that is higher than 180/120. These symptoms may be an emergency. Do not wait to see if the symptoms will go away. Get medical help right away. Call your local emergency services (911 in the [place] Do not drive yourself to the hospital. Summary  A heart attack occurs when blood and oxygen supply to the heart is cut off.  Do not take NSAIDs unless your doctor says it is okay.  Do not smoke. Avoid secondhand smoke.  Exercise regularly. Ask your doctor about a cardiac rehab program. This information is not intended to replace advice given to you by your health care provider. Make sure you discuss any questions you have with your health care provider. Document Revised: 12/28/2018 Document Reviewed: 12/28/2018 Elsevier Patient Education  2020 Elsevier Inc.  

## 2020-05-07 NOTE — Progress Notes (Signed)
Discharge instructions reviewed with patient, home care and follow up care also reviewed. All questions answered. Patients family called for patient discharge.

## 2020-05-07 NOTE — Progress Notes (Signed)
Progress Note  Patient Name: Patrick Tucker Date of Encounter: 05/07/2020  [facility] Cardiologist: Claiborne Billings at [place],  [place]   Subjective   69 year old gentleman with a history of multiple coronary stents, hypertension, hyperlipidemia, peripheral arterial disease, tobacco abuse.  He presented to the hospital last week with episodes of chest discomfort.  Urgent heart catheterization on May 05, 2020 shows a tight proximal LAD stenosis and a 95% stenosis in the ostial circumflex artery.  Both of these lesions were successfully stented.  He is doing well following PCI Wants to do cardiac rehab.       Inpatient Medications    Scheduled Meds: . aspirin  81 mg Oral Daily  . atorvastatin  80 mg Oral Daily  . losartan  25 mg Oral Daily  . metoprolol succinate  25 mg Oral Daily  . sodium chloride flush  3 mL Intravenous Q12H  . ticagrelor  90 mg Oral BID   Continuous Infusions: . sodium chloride     PRN Meds: sodium chloride, acetaminophen, diazepam, ondansetron (ZOFRAN) IV, sodium chloride flush   Vital Signs    Vitals:   05/06/20 1221 05/07/20 0029 05/07/20 0321 05/07/20 0324  BP: (!) 144/77 (!) 123/91  131/84  Pulse: 64   60  Resp: 15     Temp: 98 F (36.7 C) 97.8 F (36.6 C)  97.9 F (36.6 C)  TempSrc: Oral Oral  Oral  SpO2: 99% 99%  98%  Weight: 70.4 kg  69.6 kg   Height: 5\' 9"  (1.753 m)       Intake/Output Summary (Last 24 hours) at 05/07/2020 1052 Last data filed at 05/07/2020 0326 Gross per 24 hour  Intake 483 ml  Output 200 ml  Net 283 ml   Last 3 Weights 05/07/2020 05/06/2020 05/05/2020  Weight (lbs) 153 lb 6.4 oz 155 lb 4.8 oz 156 lb 1.4 oz  Weight (kg) 69.582 kg 70.444 kg 70.8 kg      Telemetry    NSR  - Personally Reviewed  ECG    nsr  - Personally Reviewed  Physical Exam   GEN: No acute distress.   Neck: No JVD Cardiac: RRR, no murmurs, rubs, or gallops.  Respiratory: Clear to auscultation bilaterally. GI: Soft, nontender, non-distended   MS: No edema; No deformity.,  Right radial cath site looks good Neuro:  Nonfocal  Psych: Normal affect   Labs    High Sensitivity Troponin:   Recent Labs  Lab 05/05/20 1923 05/05/20 2316 05/06/20 0113  TROPONINIHS 41* 578* 662*      Chemistry Recent Labs  Lab 05/01/20 1720 05/05/20 1923 05/06/20 0113  NA 138 137 135  K 4.4 5.1 3.7  CL 102 99 102  CO2 30 28 23   GLUCOSE 95 123* 131*  BUN 30* 23 24*  CREATININE 1.22 1.40* 1.12  CALCIUM 10.0 9.9 9.3  PROT 7.2  --   --   AST 23  --   --   ALT 21  --   --   BILITOT 0.3  --   --   GFRNONAA 61 52* >60  GFRAA 71 >60 >60  ANIONGAP  --  10 10     Hematology Recent Labs  Lab 05/01/20 1720 05/05/20 1923 05/06/20 0113  WBC 4.4 4.9 5.1  RBC 4.51 4.78 4.30  HGB 13.9 15.1 12.9*  HCT 41.5 43.3 38.5*  MCV 92.0 90.6 89.5  MCH 30.8 31.6 30.0  MCHC 33.5 34.9 33.5  RDW 12.8 13.2 13.0  PLT 248  304 244    BNPNo results for input(s): BNP, PROBNP in the last 168 hours.   DDimer No results for input(s): DDIMER in the last 168 hours.   Radiology    CARDIAC CATHETERIZATION  Result Date: 05/05/2020  1st Mrg-1 lesion is 95% stenosed.  Previously placed 1st Mrg-2 stent (unknown type) is widely patent.  Prox LAD to Mid LAD lesion is 95% stenosed.  Post intervention, there is a 0% residual stenosis.  Prox RCA to Mid RCA lesion is 90% stenosed.  Post intervention, there is a 0% residual stenosis.  A stent was successfully placed.  A stent was successfully placed.  Transient ST elevation MI/acute coronary syndrome involving two culprit vessels with a 95% proximal LAD stenosis in a large LAD system which supplies almost the entire inferior wall and a 95% near ostial stenosis in the circumflex marginal vessel just proximal to the previously placed stent;  75% diffuse AV groove circumflex stenosis and the RCA is a nondominant vessel with diffuse 90% mid stenosis. Successful two-vessel coronary intervention following the LAD with  ultimate insertion of a 3.0 x 30 mm Resolute Onyx DES stent postdilated to 3.21 mm with the 95% stenosis and diffuse irregularity beyond the stenosis being reduced to 0%, and successful PCI/DES stenting of the 95% stenosis just beyond the ostium and immediately proximal to the previously placed marginal stent with insertion of a 2.5 x 12 mm Resolute Onyx DES stent postdilated to 2.6 mm with the 95% stenosis being reduced to 0%. Elevated LVEDP at 33 mm. RECOMMENDATION: DAPT for minimum of 1 year.  Medical therapy for concomitant CAD involving the AV groove circumflex and nondominant RCA.  Aggressive lipid-lowering therapy with target LDL less than 70.  2D echo Doppler study will be obtained in a.m.   DG Chest Portable 1 View  Result Date: 05/05/2020 CLINICAL DATA:  Chest pain and shortness of breath. EXAM: PORTABLE CHEST 1 VIEW COMPARISON:  None. FINDINGS: There is no evidence of acute infiltrate, pleural effusion or pneumothorax. The heart size and mediastinal contours are within normal limits. There is a chronic seventh left rib fracture. Multilevel degenerative changes seen within the mid and lower thoracic spine. IMPRESSION: No active disease. Electronically Signed   By: Virgina Norfolk M.D.   On: 05/05/2020 19:51   ECHOCARDIOGRAM COMPLETE  Result Date: 05/06/2020    ECHOCARDIOGRAM REPORT   Patient Name:   Patrick Tucker Date of Exam: 05/06/2020 Medical Rec #:  [id number]     Height:       69.0 in Accession #:    [id number]    Weight:       156.1 lb Date of Birth:  [birth year]     BSA:          1.859 m Patient Age:    40 years      BP:           142/85 mmHg Patient Gender: M             HR:           68 bpm. Exam Location:  Inpatient Procedure: 2D Echo, Cardiac Doppler and Color Doppler Indications:    Acute Cornonary Syndrome  History:        Patient has no prior history of Echocardiogram examinations. CAD                 and Previous Myocardial Infarction; Risk Factors:Hypertension  and  Dyslipidemia.  Sonographer:    Clayton Lefort RDCS (AE) Referring Phys: [place]  1. Definity contrast did not reveal any evidence of apical thrombi. . Left ventricular ejection fraction, by estimation, is 30 to 35%. The left ventricle has moderately decreased function. The left ventricle has no regional wall motion abnormalities. Left ventricular diastolic parameters are consistent with Grade II diastolic dysfunction (pseudonormalization). There is severe akinesis of the left ventricular, mid-apical anteroseptal wall.  2. Right ventricular systolic function is normal. The right ventricular size is normal. There is normal pulmonary artery systolic pressure.  3. The mitral valve is grossly normal. Trivial mitral valve regurgitation. No evidence of mitral stenosis.  4. The aortic valve is tricuspid. Aortic valve regurgitation is not visualized. No aortic stenosis is present. FINDINGS  Left Ventricle: Definity contrast did not reveal any evidence of apical thrombi. Left ventricular ejection fraction, by estimation, is 30 to 35%. The left ventricle has moderately decreased function. The left ventricle has no regional wall motion abnormalities. Severe akinesis of the left ventricular, mid-apical anteroseptal wall. Definity contrast agent was given IV to delineate the left ventricular endocardial borders. The left ventricular internal cavity size was normal in size. There is no left ventricular hypertrophy. Left ventricular diastolic parameters are consistent with Grade II diastolic dysfunction (pseudonormalization). Right Ventricle: The right ventricular size is normal. No increase in right ventricular wall thickness. Right ventricular systolic function is normal. There is normal pulmonary artery systolic pressure. The tricuspid regurgitant velocity is 2.65 m/s, and  with an assumed right atrial pressure of 3 mmHg, the estimated right ventricular systolic pressure is 99.8 mmHg. Left Atrium: Left atrial  size was normal in size. Right Atrium: Right atrial size was normal in size. Pericardium: There is no evidence of pericardial effusion. Mitral Valve: The mitral valve is grossly normal. Trivial mitral valve regurgitation. No evidence of mitral valve stenosis. MV peak gradient, 2.7 mmHg. The mean mitral valve gradient is 1.0 mmHg. Tricuspid Valve: The tricuspid valve is grossly normal. Tricuspid valve regurgitation is trivial. Aortic Valve: The aortic valve is tricuspid. Aortic valve regurgitation is not visualized. No aortic stenosis is present. Aortic valve mean gradient measures 2.0 mmHg. Aortic valve peak gradient measures 4.2 mmHg. Aortic valve area, by VTI measures 2.24 cm. Pulmonic Valve: The pulmonic valve was grossly normal. Pulmonic valve regurgitation is not visualized. Aorta: The aortic root and ascending aorta are structurally normal, with no evidence of dilitation. IAS/Shunts: The atrial septum is grossly normal.  LEFT VENTRICLE PLAX 2D LVIDd:         4.30 cm  Diastology LVIDs:         3.40 cm  LV e' lateral:   8.59 cm/s LV PW:         1.10 cm  LV E/e' lateral: 7.3 LV IVS:        0.90 cm  LV e' medial:    6.09 cm/s LVOT diam:     1.80 cm  LV E/e' medial:  10.2 LV SV:         49 LV SV Index:   26 LVOT Area:     2.54 cm  RIGHT VENTRICLE            IVC RV Basal diam:  2.90 cm    IVC diam: 1.50 cm RV S prime:     7.25 cm/s TAPSE (M-mode): 1.9 cm LEFT ATRIUM             Index  RIGHT ATRIUM           Index LA diam:        3.10 cm 1.67 cm/m  RA Area:     10.70 cm LA Vol (A2C):   22.9 ml 12.32 ml/m RA Volume:   24.20 ml  13.01 ml/m LA Vol (A4C):   25.7 ml 13.82 ml/m LA Biplane Vol: 24.8 ml 13.34 ml/m  AORTIC VALVE AV Area (Vmax):    2.39 cm AV Area (Vmean):   2.22 cm AV Area (VTI):     2.24 cm AV Vmax:           102.00 cm/s AV Vmean:          69.200 cm/s AV VTI:            0.218 m AV Peak Grad:      4.2 mmHg AV Mean Grad:      2.0 mmHg LVOT Vmax:         95.60 cm/s LVOT Vmean:        60.300 cm/s  LVOT VTI:          0.192 m LVOT/AV VTI ratio: 0.88  AORTA Ao Root diam: 3.00 cm Ao Asc diam:  2.70 cm MITRAL VALVE               TRICUSPID VALVE MV Area (PHT): 3.72 cm    TR Peak grad:   28.1 mmHg MV Peak grad:  2.7 mmHg    TR Vmax:        265.00 cm/s MV Mean grad:  1.0 mmHg MV Vmax:       0.82 m/s    SHUNTS MV Vmean:      40.8 cm/s   Systemic VTI:  0.19 m MV Decel Time: 204 msec    Systemic Diam: 1.80 cm MV E velocity: 62.40 cm/s MV A velocity: 39.40 cm/s MV E/A ratio:  1.58 Mertie Moores MD Electronically signed by Mertie Moores MD Signature Date/Time: 05/06/2020/1:40:30 PM    Final     Cardiac Studies     Patient Profile     69 y.o. male   Assessment & Plan    1.  Coronary artery disease: He status post PCI of the LAD and left circumflex artery.  He is doing well. Continue aspirin and Brilinta for at least 1 year.  At that point consider stopping the aspirin and continuing Brilinta long-term.  2.  Hyperlipidemia:    LDL is 145.  Cont atorvastatin  OK for DC  Follow up at [place]    For questions or updates, please contact [place] Please consult [email or web address] for contact info under        Signed, Mertie Moores, MD  05/07/2020, 10:52 AM

## 2020-05-08 ENCOUNTER — Telehealth: Payer: Self-pay

## 2020-05-08 ENCOUNTER — Encounter (HOSPITAL_COMMUNITY): Payer: Self-pay | Admitting: Cardiovascular Disease

## 2020-05-08 LAB — POCT ACTIVATED CLOTTING TIME: Activated Clotting Time: 500 seconds

## 2020-05-08 NOTE — Telephone Encounter (Signed)
Received a message patient requested a return to work note for 8/16.He was discharged from [place] yesterday.Message sent to [facility] PA.

## 2020-05-09 ENCOUNTER — Telehealth: Payer: Self-pay | Admitting: Cardiovascular Disease

## 2020-05-09 ENCOUNTER — Telehealth (HOSPITAL_COMMUNITY): Payer: Self-pay

## 2020-05-09 ENCOUNTER — Telehealth: Payer: Self-pay | Admitting: *Deleted

## 2020-05-09 NOTE — Telephone Encounter (Signed)
He was admitted August 6 and underwent acute intervention.  He should have been given 30-day supply of Brilinta.  He also should have a follow-up post hospital visit within 2 weeks following his MI.  They can discuss during that evaluation about possibly switching to either prasugrel or clopidogrel after his 30 days of Brilinta are completed

## 2020-05-09 NOTE — Telephone Encounter (Signed)
Patient called to say ticagrelor (BRILINTA) 90 MG TABS tablet is too expensive, he can't afford it.

## 2020-05-09 NOTE — Telephone Encounter (Signed)
Called patient on 05/09/2020 , 9:26 AM in an attempt to reach the patient for a hospital follow up.   Admit date: 05/05/20 Discharge: 05/07/20   He does not have any questions or concerns about medications from the hospital admission. The patient's medications were reviewed over the phone, they were counseled to bring in all current medications to the hospital follow up visit.   I advised the patient to call if any questions or concerns arise about the hospital admission or medications. Patient has contacted cardiology in regard to the cost of Brillinta being to expensive for him to buy. He is waiting on a return call. Home health was not started in the hospital.  All questions were answered and a follow up appointment was made. Patient states he has an appointment with his cardiologist at [facility] on 06/02/2020. He will have an OV with Liane Comber, NP, on 05/12/2020, for an in person follow up visit.  Prior to Admission medications   Medication Sig Start Date End Date Taking? Authorizing Provider  ascorbic acid (VITAMIN C) 1000 MG tablet Take 1 tablet by mouth daily. 02/17/15   [provider]  aspirin EC 81 MG tablet Take 1 tablet (81 mg total) by mouth daily. Swallow whole. 05/07/20 05/07/21  Ledora Bottcher, PA  atorvastatin (LIPITOR) 80 MG tablet Take 1 tablet Daily for Cholesterol Patient not taking: Reported on 05/06/2020 10/10/19   Unk Pinto, MD  Bioflavonoid Products (VITAMIN C PLUS) 1000 MG TABS Take 1 tablet Daily Patient not taking: Reported on 05/06/2020 10/10/19   Unk Pinto, MD  Cholecalciferol (VITAMIN D-3) 125 MCG (5000 UT) TABS Take 1 tablet by mouth daily.    [provider]  ezetimibe (ZETIA) 10 MG tablet Take 10 mg by mouth daily. 03/10/20   [provider]  losartan (COZAAR) 25 MG tablet Take 1 tablet (25 mg total) by mouth daily. 05/08/20   [facility], Tami Lin, PA  metoprolol succinate (TOPROL-XL) 25 MG 24 hr tablet Take 1 tablet (25 mg total) by  mouth daily. 05/08/20   [facility], Tami Lin, PA  Multiple Vitamins-Minerals (MULTIVITAMIN WITH MINERALS) tablet Take 1 tablet Daily Patient taking differently: Take 1 tablet by mouth daily.  10/10/19   Unk Pinto, MD  nitroGLYCERIN (NITROSTAT) 0.4 MG SL tablet Place 1 tablet (0.4 mg total) under the tongue every 5 (five) minutes as needed for chest pain. 05/07/20 05/07/21  Ledora Bottcher, PA  ticagrelor (BRILINTA) 90 MG TABS tablet Take 1 tablet (90 mg total) by mouth 2 (two) times daily. 05/07/20   Ledora Bottcher, PA

## 2020-05-09 NOTE — Telephone Encounter (Signed)
Called patient, he states that the Brilinta that was given to him from the hospital when [facility] did his procedure; he states he can not afford it. It cost him $400.  He would like something else. He does have the medication to take currently, and is taking his ASA 81, but will not be able to get the med from the pharmacy when he runs out.  Patient also states he needs a letter to go back to work on 08/16 next week. Message from yesterday routed to PA, but advised I would route to MD as well.

## 2020-05-09 NOTE — Telephone Encounter (Signed)
Pt insurance is active and benefits verified through [facility] Co-pay $45, DED 0/0 met, out of pocket $5,500/$340 met, co-insurance 0%. no pre-authorization required. Passport, 05/09/2020_0 :27pm, REF# [id number]  Will contact patient to see if he is interested in the Cardiac Rehab Program. If interested, patient will need to complete follow up appt. Once completed, patient will be contacted for scheduling upon review by the RN Navigator.

## 2020-05-10 NOTE — Telephone Encounter (Signed)
Spoke to patient he stated he never filled Brillinta prescription due to costing too much.Stated he has a post hospital visit with his cardiologist in [place] 06/02/20.Stated he was told at discharge he can return to work in 1 week.Stated he needs a note.Advised we have Brillinta 90 mg samples.I will leave samples and a savings card along with a letter to return to work at [employer].Advised he needs to take Briliinta 90 mg twice a day.Advised not to miss taking.He will try the savings card to see if it will be more affordable, if not he will discuss with [address]about changing to plavix.

## 2020-05-10 NOTE — Telephone Encounter (Signed)
Patient is calling to follow up regards to letter to return to work. Please call to discuss.

## 2020-05-11 ENCOUNTER — Encounter (HOSPITAL_COMMUNITY): Payer: Self-pay | Admitting: Cardiovascular Disease

## 2020-05-11 DIAGNOSIS — I5021 Acute systolic (congestive) heart failure: Secondary | ICD-10-CM | POA: Insufficient documentation

## 2020-05-11 DIAGNOSIS — I509 Heart failure, unspecified: Secondary | ICD-10-CM | POA: Insufficient documentation

## 2020-05-11 NOTE — Progress Notes (Signed)
Hospital follow up  Assessment and Plan: Hospital visit follow up for:   Khai was seen today for hospitalization follow-up.  Diagnoses and all orders for this visit:  ST elevation myocardial infarction involving left anterior descending (LAD) coronary artery ([place]) See HPI for details;  Continue DAPT (brilinta, ASA), atorvastatin 80 mg, toprol 25 mg, losartan 25 mg Long discussion about lifestyle; primarily diet; low processed, low animal fat, low sugar, high plant based reviewed at length; wife present - encouraged to reach out with any further Qs Encouraged slow incorporation of walking on flat surfaces Has nitro but hasn't needed; denies concerning sx Has planned follow up with est cardiology in 3 weeks Follow up sooner here if any concerns Go to the ER if any chest pain, shortness of breath, nausea, dizziness, severe HA, changes vision/speech  Acute systolic congestive heart failure (HCC) EF 30-35% per recent ECHO; plan for repeat in 3 months with established [facility] will cardiologist; life vest was not recommended; EF 30-35%; spironolactone deferred due to soft BPs; has follow up in 3 weeks with est cardiologist Disease process and medications discussed. Questions answered fully. Emphasized salt restriction, less than 2000mg  a day. Encouraged daily monitoring of the patient's weight, call office if 5 lb weight loss or gain in a day.  Encouraged regular exercise. If any increasing shortness of breath, swelling, or chest pressure go to ER immediately.  decrease your fluid intake to less than 2 L daily  Essential hypertension Continues with soft BPs; not checking at home but has cuff, discussed and will start checking daily;  Monitor blood pressure at home; call if consistently over 130/80 or <90/60 with any dizziness, weakness, CP Continue DASH diet.   Reminder to go to the ER if any CP, SOB, nausea, dizziness, severe HA, changes vision/speech, left arm numbness and tingling and  jaw pain. -     COMPLETE METABOLIC PANEL WITH GFR  Mixed hyperlipidemia Patient endorses compliance with atorvastatin 80 mg and new zetia 10 mg daily  LDL goal <70 Discussed low cholesterol diet and encouragedexercise.  Check lipid panel q31m;  -     COMPLETE METABOLIC PANEL WITH GFR  Anemia, unspecified type Post procedural; recheck for trend -     CBC with Differential/Platelet  All medications were reviewed with patient and family and fully reconciled. All questions answered fully, and patient and family members were encouraged to call the office with any further questions or concerns. Discussed goal to avoid readmission related to this diagnosis.  There are no discontinued medications.  Over 40 minutes of exam, counseling, chart review, and complex, high/moderate level critical decision making was performed this visit.   Future Appointments  Date Time Provider [place]  08/01/2020  4:00 PM Unk Pinto, MD [facility] None  05/09/2021  3:00 PM Liane Comber, NP [facility] None     HPI 69 y.o.male presents for follow up for transition from recent hospitalization or SNIF stay. Admit date to the hospital was 05/05/20, patient was discharged from the hospital on 05/07/20 and our clinical staff contacted the office the day after discharge to set up a follow up appointment. The discharge summary, medications, and diagnostic test results were reviewed before meeting with the patient. The patient was admitted for:    Principal Problem:   ST elevation myocardial infarction (STEMI) [facility]) Active Problems:   Hypertension   Hyperlipidemia   Former smoker   CAD (coronary artery disease)   PAD (peripheral artery disease) ([place])   ACS (acute coronary  syndrome) ([place])  Admit date: 05/05/2020 Discharge date: 05/07/2020  Primary Care Provider: Unk Pinto, MD  Primary Cardiologist: Shelva Majestic, MD with [facility], primarily follows with [facility] cardiology Primary  Electrophysiologist:  None    History of Present Illness     Caydin Yeatts is a 69 y.o. male with pmh of CAD s/p multiple stents, HTN, HLD, PAD, tobacco abuse who presented to the ED with chest pain on 05/05/2020  Mr.Bennettis followed by cardiology at [facility]. According to records he has history of CAD with prior MI with stenting to the marginal branch that was occluded and a subtotally occluded RCA. He was found to have moderate disease in the LAD. The OM was opened up at that time. In 2016 he had stress test showing inferolateral ischemia but this was asymptomatic and was treated medically. Patient has history of PAD as well with disease of SFA s/p PCI to the left which later re-occluded.   The patient presented to the ED 05/05/20 for chest pain. Pain started that morning and patient had associated sob. symptoms came back later that evening while he was walking. EKG obtained showed ischemic changes and CODE STEMI was called. BP 137/90, RR 15, pulse 79, afebrile.   Diagnostic Studies/Procedures    Left heart cath 05/05/20  1st Mrg-1 lesion is 95% stenosed.  Previously placed 1st Mrg-2 stent (unknown type) is widely patent.  Prox LAD to Mid LAD lesion is 95% stenosed.  Post intervention, there is a 0% residual stenosis.  Prox RCA to Mid RCA lesion is 90% stenosed.  Post intervention, there is a 0% residual stenosis.  A stent was successfully placed.  A stent was successfully placed.  Transient ST elevation MI/acute coronary syndrome involving two culprit vessels with a 95% proximal LAD stenosis in a large LAD system which supplies almost the entire inferior wall and a 95% near ostial stenosis in the circumflex marginal vessel just proximal to the previously placed stent; 75% diffuse AV groove circumflex stenosis and the RCA is a nondominant vessel with diffuse 90% mid stenosis.  Successful two-vessel coronary intervention following the LAD with ultimate insertion of a 3.0 x 30 mm  Resolute Onyx DES stent postdilated to 3.21 mm with the 95% stenosis and diffuse irregularity beyond the stenosis being reduced to 0%, and successful PCI/DES stenting of the 95% stenosis just beyond the ostium and immediately proximal to the previously placed marginal stent with insertion of a 2.5 x 12 mm Resolute Onyx DES stent postdilated to 2.6 mm with the 95% stenosis being reduced to 0%.  Elevated LVEDP at 33 mm.  RECOMMENDATION: DAPT for minimum of 1 year. Medical therapy for concomitant CAD involving the AV groove circumflex and nondominant RCA. Aggressive lipid-lowering therapy with target LDL less than 70.   _____________   Echo 05/06/20: 1. Definity contrast did not reveal any evidence of apical thrombi.  Left ventricular ejection fraction, by estimation, is 30 to 35%. The left  ventricle has moderately decreased function. The left ventricle has no  regional wall motion abnormalities.  Left ventricular diastolic parameters are consistent with Grade II  diastolic dysfunction (pseudonormalization). There is severe akinesis of  the left ventricular, mid-apical anteroseptal wall.  2. Right ventricular systolic function is normal. The right ventricular  size is normal. There is normal pulmonary artery systolic pressure.  3. The mitral valve is grossly normal. Trivial mitral valve  regurgitation. No evidence of mitral stenosis.  4. The aortic valve is tricuspid. Aortic valve regurgitation is not  visualized. No aortic stenosis is present.    Hospital Course     Consultants: none  Acute STEMI CODE STEMI activated and patient was taken emergently to the cath lab. Angiography revealed 95% stenosis in the mid LAD, which is a large vessel that supplies almost the entire inferior wall successfully treated with DES. He also as 95% near ostial stenosis in the Cx marginal vessel just proximal previously placed stent. This was successfully treated with DES. Small caliber RCA with  90% stenosis will be treated medically. HS troponin peaked at 662. He tolerated the procedure well. He was started on ASA and briltina.   Ischemic cardiomyopathy Systolic heart failure - new onset Echo this admission with EF 30-35%.  Prior echo summarized in [facility] cardiology note dated 08/06/19 as "low normal LVEF of 50%." Suspect an ischemic cardiomyopathy. Case discussed with Dr. Acie Fredrickson and he does not recommend LifeVest at this time. Patient has been instructed to follow up with his primary cardiologist at [facility] for titration of medications and repeat echo in 3 months.   Hypertension Titrate losartan as able in OP setting.   Hyperlipidemia with LDL goal < 70 05/05/2020: Cholesterol 229; HDL 59; LDL Cholesterol 145; Triglycerides 123; VLDL 25 Continue 80 mg lipitor and zetia. Will need to consider PCSK9i.   PAD s/p L SFA intervention (2018) Has been on pletal. I have held this in the setting of DAPT. Will defer to primary cardiologist for timing to resume. Consider 2.5 mg xarelto.  Pt seen and examined by Dr. Acie Fredrickson and deemed stable for discharge.  Instructed to follow up with primary cardiologist at [facility].    Did the patient have an acute coronary syndrome (MI, NSTEMI, STEMI, etc) this admission?:  Yes                               AHA/ACC Clinical Performance & Quality Measures: 1. Aspirin prescribed? - Yes 2. ADP Receptor Inhibitor (Plavix/Clopidogrel, Brilinta/Ticagrelor or Effient/Prasugrel) prescribed (includes medically managed patients)? - Yes 3. Beta Blocker prescribed? - Yes 4. High Intensity Statin (Lipitor 40-80mg  or Crestor 20-40mg ) prescribed? - Yes 5. EF assessed during THIS hospitalization? - Yes 6. For EF <40%, was ACEI/ARB prescribed? - Yes 7. For EF <40%, Aldosterone Antagonist (Spironolactone or Eplerenone) prescribed? - No - Reason:  marginal pressure 8. Cardiac Rehab Phase II ordered (including medically managed patients)? - Yes   05/12/2020 hospital follow  up:  BP (!) 98/58   Pulse 74   Temp (!) 97.2 F (36.2 C)   Wt 156 lb (70.8 kg)   SpO2 96%   BMI 23.04 kg/m    Hospital follow up for STEMI in pt with hx of of CAD; LAD and ostial stenosis of Cx marginal vessel successfully treated by DES. Small caliber RCA with 90% stenosis will be treated medically. Also with acute systolic CHF with EF 82-99%, not recommended for life vest. Initiated on DAPT with brillinta, ASA. 5 days since discharge.   He is here with his wife.   He is on toprol 25 mg daily, losartan 25 mg daily. Not on spironolactone due to soft blood pressures. He admits not checking BPs, but denies dizziness, fatigue, CP, dyspnea. Does have cuff and agreeable to start checking.   He is prescribed high intensity statin, atorvastatin 80 mg daily, with LDL check in hospital of 145, suspect he was not actually taking med as at check in 01/10/2020 LDL was 69. Today he  states he has been taking consistently. Zetia 10 mg daily was added during this admission without suspected SE.   Life vest was not advised, he is to follow up with established cardiology at [facility], Dr. Arby Barrette. Has follow up 06/02/2020.   Today their BP is BP: (!) 98/58  He does not workout. He denies chest pain, shortness of breath, dizziness.  He has a history of Systolic Denies dyspnea on exertion, orthopnea, paroxysmal nocturnal dyspnea and edema. Positive for fatigue.    Wt Readings from Last 3 Encounters:  05/12/20 156 lb (70.8 kg)  05/07/20 153 lb 6.4 oz (69.6 kg)  05/01/20 157 lb 12.8 oz (71.6 kg)    He is on cholesterol medication and denies myalgias. His cholesterol is not at goal. The cholesterol last visit was:   Lab Results  Component Value Date   CHOL 229 (H) 05/05/2020   HDL 59 05/05/2020   LDLCALC 145 (H) 05/05/2020   TRIG 123 05/05/2020   CHOLHDL 3.9 05/05/2020    CBC Latest Ref Rng & Units 05/06/2020 05/05/2020 05/01/2020  WBC 4.0 - 10.5 K/uL 5.1 4.9 4.4  Hemoglobin 13.0 - 17.0 g/dL  12.9(L) 15.1 13.9  Hematocrit 39 - 52 % 38.5(L) 43.3 41.5  Platelets 150 - 400 K/uL 244 304 248   Lab Results  Component Value Date   NA 135 05/06/2020   K 3.7 05/06/2020   CL 102 05/06/2020   CO2 23 05/06/2020   GLUCOSE 131 (H) 05/06/2020   BUN 24 (H) 05/06/2020   CREATININE 1.12 05/06/2020   CALCIUM 9.3 05/06/2020   GFRAA >60 05/06/2020   GFRNONAA >60 05/06/2020     Home health is not involved.   Images while in the hospital: CARDIAC CATHETERIZATION  Addendum Date: 05/11/2020    1st Mrg-1 lesion is 95% stenosed.  Previously placed 1st Mrg-2 stent (unknown type) is widely patent.  Prox LAD to Mid LAD lesion is 95% stenosed.  Post intervention, there is a 0% residual stenosis.  Prox RCA to Mid RCA lesion is 90% stenosed.  Post intervention, there is a 0% residual stenosis.  A stent was successfully placed.  A stent was successfully placed.  Transient ST elevation MI/acute coronary syndrome involving two culprit vessels with a 95% proximal LAD stenosis in a large LAD system which supplies almost the entire inferior wall and a 95% near ostial stenosis in the circumflex marginal vessel just proximal to the previously placed stent;  75% diffuse AV groove circumflex stenosis and the RCA is a nondominant vessel with diffuse 90% mid stenosis. Successful two-vessel coronary intervention following the LAD with ultimate insertion of a 3.0 x 30 mm Resolute Onyx DES stent postdilated to 3.21 mm with the 95% stenosis and diffuse irregularity beyond the stenosis being reduced to 0%, and successful PCI/DES stenting of the 95% stenosis just beyond the ostium and immediately proximal to the previously placed marginal stent with insertion of a 2.5 x 12 mm Resolute Onyx DES stent postdilated to 2.6 mm with the 95% stenosis being reduced to 0%. Elevated LVEDP at 33 mm. RECOMMENDATION: DAPT for minimum of 1 year.  Medical therapy for concomitant CAD involving the AV groove circumflex and nondominant RCA.   Aggressive lipid-lowering therapy with target LDL less than 70.  2D echo Doppler study will be obtained in a.m.   Addendum Date: 05/11/2020    1st Mrg-1 lesion is 95% stenosed.  Previously placed 1st Mrg-2 stent (unknown type) is widely patent.  Prox LAD to  Mid LAD lesion is 95% stenosed.  Post intervention, there is a 0% residual stenosis.  Prox RCA to Mid RCA lesion is 90% stenosed.  Post intervention, there is a 0% residual stenosis.  A stent was successfully placed.  A stent was successfully placed.  Transient ST elevation MI/acute coronary syndrome involving two culprit vessels with a 95% proximal LAD stenosis in a large LAD system which supplies almost the entire inferior wall and a 95% near ostial stenosis in the circumflex marginal vessel just proximal to the previously placed stent;  75% diffuse AV groove circumflex stenosis and the RCA is a nondominant vessel with diffuse 90% mid stenosis. Successful two-vessel coronary intervention following the LAD with ultimate insertion of a 3.0 x 30 mm Resolute Onyx DES stent postdilated to 3.21 mm with the 95% stenosis and diffuse irregularity beyond the stenosis being reduced to 0%, and successful PCI/DES stenting of the 95% stenosis just beyond the ostium and immediately proximal to the previously placed marginal stent with insertion of a 2.5 x 12 mm Resolute Onyx DES stent postdilated to 2.6 mm with the 95% stenosis being reduced to 0%. Elevated LVEDP at 33 mm. RECOMMENDATION: DAPT for minimum of 1 year.  Medical therapy for concomitant CAD involving the AV groove circumflex and nondominant RCA.  Aggressive lipid-lowering therapy with target LDL less than 70.  2D echo Doppler study will be obtained in a.m.   Result Date: 05/05/2020  1st Mrg-1 lesion is 95% stenosed.  Previously placed 1st Mrg-2 stent (unknown type) is widely patent.  Prox LAD to Mid LAD lesion is 95% stenosed.  Post intervention, there is a 0% residual stenosis.  Prox RCA to Mid  RCA lesion is 90% stenosed.  Post intervention, there is a 0% residual stenosis.  A stent was successfully placed.  A stent was successfully placed.  Transient ST elevation MI/acute coronary syndrome involving two culprit vessels with a 95% proximal LAD stenosis in a large LAD system which supplies almost the entire inferior wall and a 95% near ostial stenosis in the circumflex marginal vessel just proximal to the previously placed stent;  75% diffuse AV groove circumflex stenosis and the RCA is a nondominant vessel with diffuse 90% mid stenosis. Successful two-vessel coronary intervention following the LAD with ultimate insertion of a 3.0 x 30 mm Resolute Onyx DES stent postdilated to 3.21 mm with the 95% stenosis and diffuse irregularity beyond the stenosis being reduced to 0%, and successful PCI/DES stenting of the 95% stenosis just beyond the ostium and immediately proximal to the previously placed marginal stent with insertion of a 2.5 x 12 mm Resolute Onyx DES stent postdilated to 2.6 mm with the 95% stenosis being reduced to 0%. Elevated LVEDP at 33 mm. RECOMMENDATION: DAPT for minimum of 1 year.  Medical therapy for concomitant CAD involving the AV groove circumflex and nondominant RCA.  Aggressive lipid-lowering therapy with target LDL less than 70.  2D echo Doppler study will be obtained in a.m.   DG Chest Portable 1 View  Result Date: 05/05/2020 CLINICAL DATA:  Chest pain and shortness of breath. EXAM: PORTABLE CHEST 1 VIEW COMPARISON:  None. FINDINGS: There is no evidence of acute infiltrate, pleural effusion or pneumothorax. The heart size and mediastinal contours are within normal limits. There is a chronic seventh left rib fracture. Multilevel degenerative changes seen within the mid and lower thoracic spine. IMPRESSION: No active disease. Electronically Signed   By: Virgina Norfolk M.D.   On: 05/05/2020 19:51   ECHOCARDIOGRAM  COMPLETE  Result Date: 05/06/2020    ECHOCARDIOGRAM REPORT    Patient Name:   DAMIEL BARTHOLD Date of Exam: 05/06/2020 Medical Rec #:  [id number]     Height:       69.0 in Accession #:    [id number]    Weight:       156.1 lb Date of Birth:  [birth year]     BSA:          1.859 m Patient Age:    27 years      BP:           142/85 mmHg Patient Gender: M             HR:           68 bpm. Exam Location:  Inpatient Procedure: 2D Echo, Cardiac Doppler and Color Doppler Indications:    Acute Cornonary Syndrome  History:        Patient has no prior history of Echocardiogram examinations. CAD                 and Previous Myocardial Infarction; Risk Factors:Hypertension                 and Dyslipidemia.  Sonographer:    Clayton Lefort RDCS (AE) Referring Phys: [place]  1. Definity contrast did not reveal any evidence of apical thrombi. . Left ventricular ejection fraction, by estimation, is 30 to 35%. The left ventricle has moderately decreased function. The left ventricle has no regional wall motion abnormalities. Left ventricular diastolic parameters are consistent with Grade II diastolic dysfunction (pseudonormalization). There is severe akinesis of the left ventricular, mid-apical anteroseptal wall.  2. Right ventricular systolic function is normal. The right ventricular size is normal. There is normal pulmonary artery systolic pressure.  3. The mitral valve is grossly normal. Trivial mitral valve regurgitation. No evidence of mitral stenosis.  4. The aortic valve is tricuspid. Aortic valve regurgitation is not visualized. No aortic stenosis is present. FINDINGS  Left Ventricle: Definity contrast did not reveal any evidence of apical thrombi. Left ventricular ejection fraction, by estimation, is 30 to 35%. The left ventricle has moderately decreased function. The left ventricle has no regional wall motion abnormalities. Severe akinesis of the left ventricular, mid-apical anteroseptal wall. Definity contrast agent was given IV to delineate the left ventricular  endocardial borders. The left ventricular internal cavity size was normal in size. There is no left ventricular hypertrophy. Left ventricular diastolic parameters are consistent with Grade II diastolic dysfunction (pseudonormalization). Right Ventricle: The right ventricular size is normal. No increase in right ventricular wall thickness. Right ventricular systolic function is normal. There is normal pulmonary artery systolic pressure. The tricuspid regurgitant velocity is 2.65 m/s, and  with an assumed right atrial pressure of 3 mmHg, the estimated right ventricular systolic pressure is 10.1 mmHg. Left Atrium: Left atrial size was normal in size. Right Atrium: Right atrial size was normal in size. Pericardium: There is no evidence of pericardial effusion. Mitral Valve: The mitral valve is grossly normal. Trivial mitral valve regurgitation. No evidence of mitral valve stenosis. MV peak gradient, 2.7 mmHg. The mean mitral valve gradient is 1.0 mmHg. Tricuspid Valve: The tricuspid valve is grossly normal. Tricuspid valve regurgitation is trivial. Aortic Valve: The aortic valve is tricuspid. Aortic valve regurgitation is not visualized. No aortic stenosis is present. Aortic valve mean gradient measures 2.0 mmHg. Aortic valve peak gradient measures 4.2 mmHg. Aortic valve area, by VTI measures 2.24 cm.  Pulmonic Valve: The pulmonic valve was grossly normal. Pulmonic valve regurgitation is not visualized. Aorta: The aortic root and ascending aorta are structurally normal, with no evidence of dilitation. IAS/Shunts: The atrial septum is grossly normal.  LEFT VENTRICLE PLAX 2D LVIDd:         4.30 cm  Diastology LVIDs:         3.40 cm  LV e' lateral:   8.59 cm/s LV PW:         1.10 cm  LV E/e' lateral: 7.3 LV IVS:        0.90 cm  LV e' medial:    6.09 cm/s LVOT diam:     1.80 cm  LV E/e' medial:  10.2 LV SV:         49 LV SV Index:   26 LVOT Area:     2.54 cm  RIGHT VENTRICLE            IVC RV Basal diam:  2.90 cm    IVC  diam: 1.50 cm RV S prime:     7.25 cm/s TAPSE (M-mode): 1.9 cm LEFT ATRIUM             Index       RIGHT ATRIUM           Index LA diam:        3.10 cm 1.67 cm/m  RA Area:     10.70 cm LA Vol (A2C):   22.9 ml 12.32 ml/m RA Volume:   24.20 ml  13.01 ml/m LA Vol (A4C):   25.7 ml 13.82 ml/m LA Biplane Vol: 24.8 ml 13.34 ml/m  AORTIC VALVE AV Area (Vmax):    2.39 cm AV Area (Vmean):   2.22 cm AV Area (VTI):     2.24 cm AV Vmax:           102.00 cm/s AV Vmean:          69.200 cm/s AV VTI:            0.218 m AV Peak Grad:      4.2 mmHg AV Mean Grad:      2.0 mmHg LVOT Vmax:         95.60 cm/s LVOT Vmean:        60.300 cm/s LVOT VTI:          0.192 m LVOT/AV VTI ratio: 0.88  AORTA Ao Root diam: 3.00 cm Ao Asc diam:  2.70 cm MITRAL VALVE               TRICUSPID VALVE MV Area (PHT): 3.72 cm    TR Peak grad:   28.1 mmHg MV Peak grad:  2.7 mmHg    TR Vmax:        265.00 cm/s MV Mean grad:  1.0 mmHg MV Vmax:       0.82 m/s    SHUNTS MV Vmean:      40.8 cm/s   Systemic VTI:  0.19 m MV Decel Time: 204 msec    Systemic Diam: 1.80 cm MV E velocity: 62.40 cm/s MV A velocity: 39.40 cm/s MV E/A ratio:  1.58 Mertie Moores MD Electronically signed by Mertie Moores MD Signature Date/Time: 05/06/2020/1:40:30 PM    Final       Current Outpatient Medications (Cardiovascular):  .  atorvastatin (LIPITOR) 80 MG tablet, Take 1 tablet Daily for Cholesterol .  ezetimibe (ZETIA) 10 MG tablet, Take 10 mg by mouth daily. Marland Kitchen  losartan (COZAAR) 25 MG tablet,  Take 1 tablet (25 mg total) by mouth daily. .  metoprolol succinate (TOPROL-XL) 25 MG 24 hr tablet, Take 1 tablet (25 mg total) by mouth daily. .  nitroGLYCERIN (NITROSTAT) 0.4 MG SL tablet, Place 1 tablet (0.4 mg total) under the tongue every 5 (five) minutes as needed for chest pain.   Current Outpatient Medications (Analgesics):  .  aspirin EC 81 MG tablet, Take 1 tablet (81 mg total) by mouth daily. Swallow whole.  Current Outpatient Medications (Hematological):  .   ticagrelor (BRILINTA) 90 MG TABS tablet, Take 1 tablet (90 mg total) by mouth 2 (two) times daily.  Current Outpatient Medications (Other):  .  ascorbic acid (VITAMIN C) 1000 MG tablet, Take 1 tablet by mouth daily. Marland Kitchen  Bioflavonoid Products (VITAMIN C PLUS) 1000 MG TABS, Take 1 tablet Daily .  Cholecalciferol (VITAMIN D-3) 125 MCG (5000 UT) TABS, Take 1 tablet by mouth daily. .  Multiple Vitamins-Minerals (MULTIVITAMIN WITH MINERALS) tablet, Take 1 tablet Daily (Patient taking differently: Take 1 tablet by mouth daily. )  Past Medical History:  Diagnosis Date  . Hyperlipidemia   . Hypertension 2010  . Myocardial infarction ([place]) 2015  . Prediabetes 2015     No Known Allergies  ROS: all negative except above.   Physical Exam: Filed Weights   05/12/20 0845  Weight: 156 lb (70.8 kg)   BP (!) 98/58   Pulse 74   Temp (!) 97.2 F (36.2 C)   Wt 156 lb (70.8 kg)   SpO2 96%   BMI 23.04 kg/m  General Appearance: Well nourished, in no apparent distress. Eyes: PERRLA, EOMs, conjunctiva no swelling or erythema Sinuses: No Frontal/maxillary tenderness ENT/Mouth: Ext aud canals clear, TMs without erythema, bulging. No erythema, swelling, or exudate on post pharynx.  Tonsils not swollen or erythematous. Hearing normal.  Neck: Supple, thyroid normal.  Respiratory: Respiratory effort normal, BS equal bilaterally without rales, rhonchi, wheezing or stridor.  Cardio: Quiet heart sounds, normal S1/S2, RRR with no MRGs. Intact symmetrical peripheral pulses without edema.  Abdomen: Soft, + BS.  Non tender, no guarding, rebound, hernias, masses. Lymphatics: Non tender without lymphadenopathy.  Musculoskeletal: Full ROM, 5/5 strength, normal gait.  Skin: Warm, dry without rashes, lesions, ecchymosis.  Neuro: Cranial nerves intact. Normal muscle tone, no cerebellar symptoms. Sensation intact.  Psych: Awake and oriented X 3, normal affect, Insight and Judgment appropriate.     Izora Ribas,  NP 1:05 PM [facility]

## 2020-05-12 ENCOUNTER — Encounter: Payer: Self-pay | Admitting: Adult Health

## 2020-05-12 ENCOUNTER — Ambulatory Visit (INDEPENDENT_AMBULATORY_CARE_PROVIDER_SITE_OTHER): Payer: Medicare HMO | Admitting: Adult Health

## 2020-05-12 ENCOUNTER — Other Ambulatory Visit: Payer: Self-pay

## 2020-05-12 VITALS — BP 98/58 | HR 74 | Temp 97.2°F | Wt 156.0 lb

## 2020-05-12 DIAGNOSIS — I5021 Acute systolic (congestive) heart failure: Secondary | ICD-10-CM

## 2020-05-12 DIAGNOSIS — D649 Anemia, unspecified: Secondary | ICD-10-CM | POA: Diagnosis not present

## 2020-05-12 DIAGNOSIS — I10 Essential (primary) hypertension: Secondary | ICD-10-CM

## 2020-05-12 DIAGNOSIS — I2102 ST elevation (STEMI) myocardial infarction involving left anterior descending coronary artery: Secondary | ICD-10-CM | POA: Diagnosis not present

## 2020-05-12 DIAGNOSIS — E782 Mixed hyperlipidemia: Secondary | ICD-10-CM

## 2020-05-12 LAB — CBC WITH DIFFERENTIAL/PLATELET
Absolute Monocytes: 537 cells/uL (ref 200–950)
Basophils Absolute: 31 cells/uL (ref 0–200)
Basophils Relative: 0.9 %
Eosinophils Absolute: 122 cells/uL (ref 15–500)
Eosinophils Relative: 3.6 %
HCT: 41.7 % (ref 38.5–50.0)
Hemoglobin: 13.6 g/dL (ref 13.2–17.1)
Lymphs Abs: 1316 cells/uL (ref 850–3900)
MCH: 29.8 pg (ref 27.0–33.0)
MCHC: 32.6 g/dL (ref 32.0–36.0)
MCV: 91.4 fL (ref 80.0–100.0)
MPV: 10.5 fL (ref 7.5–12.5)
Monocytes Relative: 15.8 %
Neutro Abs: 1394 cells/uL — ABNORMAL LOW (ref 1500–7800)
Neutrophils Relative %: 41 %
Platelets: 253 10*3/uL (ref 140–400)
RBC: 4.56 10*6/uL (ref 4.20–5.80)
RDW: 12.3 % (ref 11.0–15.0)
Total Lymphocyte: 38.7 %
WBC: 3.4 10*3/uL — ABNORMAL LOW (ref 3.8–10.8)

## 2020-05-12 LAB — COMPLETE METABOLIC PANEL WITH GFR
AG Ratio: 1.9 (calc) (ref 1.0–2.5)
ALT: 19 U/L (ref 9–46)
AST: 27 U/L (ref 10–35)
Albumin: 4.3 g/dL (ref 3.6–5.1)
Alkaline phosphatase (APISO): 82 U/L (ref 35–144)
BUN/Creatinine Ratio: 23 (calc) — ABNORMAL HIGH (ref 6–22)
BUN: 32 mg/dL — ABNORMAL HIGH (ref 7–25)
CO2: 30 mmol/L (ref 20–32)
Calcium: 10 mg/dL (ref 8.6–10.3)
Chloride: 100 mmol/L (ref 98–110)
Creat: 1.41 mg/dL — ABNORMAL HIGH (ref 0.70–1.25)
GFR, Est African American: 60 mL/min/{1.73_m2} (ref >=60)
GFR, Est Non African American: 52 mL/min/{1.73_m2} — ABNORMAL LOW (ref >=60)
Globulin: 2.3 g/dL (calc) (ref 1.9–3.7)
Glucose, Bld: 93 mg/dL (ref 65–99)
Potassium: 5.3 mmol/L (ref 3.5–5.3)
Sodium: 136 mmol/L (ref 135–146)
Total Bilirubin: 0.4 mg/dL (ref 0.2–1.2)
Total Protein: 6.6 g/dL (ref 6.1–8.1)

## 2020-05-12 NOTE — Patient Instructions (Addendum)
Goals    . HEMOGLOBIN A1C < 5.7    . LDL CALC < 70         Check blood pressure regularly - try different times of the day and keep a log  Check "dry weight" daily in the morning - keep a log - if fluctuating and gaining more tan usual (more than 3-5 lb up from baseline) - AND having swelling in legs, new/progressive cough, getting short of breath with exertion or after lying down flat, need to call our office or cardiologist   Focus on increasing plants, fiber, beans, nuts - try to eat 2/3rds of your plate with plants at each meal     DASH Eating Plan DASH stands for "Dietary Approaches to Stop Hypertension." The DASH eating plan is a healthy eating plan that has been shown to reduce high blood pressure (hypertension). It may also reduce your risk for type 2 diabetes, heart disease, and stroke. The DASH eating plan may also help with weight loss. What are tips for following this plan?  General guidelines  Avoid eating more than 2,300 mg (milligrams) of salt (sodium) a day. If you have hypertension, you may need to reduce your sodium intake to 1,500 mg a day.  Limit alcohol intake to no more than 1 drink a day for nonpregnant women and 2 drinks a day for men. One drink equals 12 oz of beer, 5 oz of wine, or 1 oz of hard liquor.  Work with your health care provider to maintain a healthy body weight or to lose weight. Ask what an ideal weight is for you.  Get at least 30 minutes of exercise that causes your heart to beat faster (aerobic exercise) most days of the week. Activities may include walking, swimming, or biking.  Work with your health care provider or diet and nutrition specialist (dietitian) to adjust your eating plan to your individual calorie needs. Reading food labels   Check food labels for the amount of sodium per serving. Choose foods with less than 5 percent of the Daily Value of sodium. Generally, foods with less than 300 mg of sodium per serving fit into this  eating plan.  To find whole grains, look for the word "whole" as the first word in the ingredient list. Shopping  Buy products labeled as "low-sodium" or "no salt added."  Buy fresh foods. Avoid canned foods and premade or frozen meals. Cooking  Avoid adding salt when cooking. Use salt-free seasonings or herbs instead of table salt or sea salt. Check with your health care provider or pharmacist before using salt substitutes.  Do not fry foods. Cook foods using healthy methods such as baking, boiling, grilling, and broiling instead.  Cook with heart-healthy oils, such as olive, canola, soybean, or sunflower oil. Meal planning  Eat a balanced diet that includes: ? 5 or more servings of fruits and vegetables each day. At each meal, try to fill half of your plate with fruits and vegetables. ? Up to 6-8 servings of whole grains each day. ? Less than 6 oz of lean meat, poultry, or fish each day. A 3-oz serving of meat is about the same size as a deck of cards. One egg equals 1 oz. ? 2 servings of low-fat dairy each day. ? A serving of nuts, seeds, or beans 5 times each week. ? Heart-healthy fats. Healthy fats called Omega-3 fatty acids are found in foods such as flaxseeds and coldwater fish, like sardines, salmon, and mackerel.  Limit  how much you eat of the following: ? Canned or prepackaged foods. ? Food that is high in trans fat, such as fried foods. ? Food that is high in saturated fat, such as fatty meat. ? Sweets, desserts, sugary drinks, and other foods with added sugar. ? Full-fat dairy products.  Do not salt foods before eating.  Try to eat at least 2 vegetarian meals each week.  Eat more home-cooked food and less restaurant, buffet, and fast food.  When eating at a restaurant, ask that your food be prepared with less salt or no salt, if possible. What foods are recommended? The items listed may not be a complete list. Talk with your dietitian about what dietary choices are  best for you. Grains Whole-grain or whole-wheat bread. Whole-grain or whole-wheat pasta. Brown rice. Modena Morrow. Bulgur. Whole-grain and low-sodium cereals. Pita bread. Low-fat, low-sodium crackers. Whole-wheat flour tortillas. Vegetables Fresh or frozen vegetables (raw, steamed, roasted, or grilled). Low-sodium or reduced-sodium tomato and vegetable juice. Low-sodium or reduced-sodium tomato sauce and tomato paste. Low-sodium or reduced-sodium canned vegetables. Fruits All fresh, dried, or frozen fruit. Canned fruit in natural juice (without added sugar). Meat and other protein foods Skinless chicken or [place]. Ground chicken or [place]. Pork with fat trimmed off. Fish and seafood. Egg whites. Dried beans, peas, or lentils. Unsalted nuts, nut butters, and seeds. Unsalted canned beans. Lean cuts of beef with fat trimmed off. Low-sodium, lean deli meat. Dairy Low-fat (1%) or fat-free (skim) milk. Fat-free, low-fat, or reduced-fat cheeses. Nonfat, low-sodium ricotta or cottage cheese. Low-fat or nonfat yogurt. Low-fat, low-sodium cheese. Fats and oils Soft margarine without trans fats. Vegetable oil. Low-fat, reduced-fat, or light mayonnaise and salad dressings (reduced-sodium). Canola, safflower, olive, soybean, and sunflower oils. Avocado. Seasoning and other foods Herbs. Spices. Seasoning mixes without salt. Unsalted popcorn and pretzels. Fat-free sweets. What foods are not recommended? The items listed may not be a complete list. Talk with your dietitian about what dietary choices are best for you. Grains Baked goods made with fat, such as croissants, muffins, or some breads. Dry pasta or rice meal packs. Vegetables Creamed or fried vegetables. Vegetables in a cheese sauce. Regular canned vegetables (not low-sodium or reduced-sodium). Regular canned tomato sauce and paste (not low-sodium or reduced-sodium). Regular tomato and vegetable juice (not low-sodium or reduced-sodium). Angie Fava.  Olives. Fruits Canned fruit in a light or heavy syrup. Fried fruit. Fruit in cream or butter sauce. Meat and other protein foods Fatty cuts of meat. Ribs. Fried meat. Berniece Salines. Sausage. Bologna and other processed lunch meats. Salami. Fatback. Hotdogs. Bratwurst. Salted nuts and seeds. Canned beans with added salt. Canned or smoked fish. Whole eggs or egg yolks. Chicken or [place] with skin. Dairy Whole or 2% milk, cream, and half-and-half. Whole or full-fat cream cheese. Whole-fat or sweetened yogurt. Full-fat cheese. Nondairy creamers. Whipped toppings. Processed cheese and cheese spreads. Fats and oils Butter. Stick margarine. Lard. Shortening. Ghee. Bacon fat. Tropical oils, such as coconut, palm kernel, or palm oil. Seasoning and other foods Salted popcorn and pretzels. Onion salt, garlic salt, seasoned salt, table salt, and sea salt. Worcestershire sauce. Tartar sauce. Barbecue sauce. Teriyaki sauce. Soy sauce, including reduced-sodium. Steak sauce. Canned and packaged gravies. Fish sauce. Oyster sauce. Cocktail sauce. Horseradish that you find on the shelf. Ketchup. Mustard. Meat flavorings and tenderizers. Bouillon cubes. Hot sauce and Tabasco sauce. Premade or packaged marinades. Premade or packaged taco seasonings. Relishes. Regular salad dressings. Where to find more information:  National Heart, Lung, and Blood Institute:  https://wilson-eaton.com/  American Heart Association: www.heart.org Summary  The DASH eating plan is a healthy eating plan that has been shown to reduce high blood pressure (hypertension). It may also reduce your risk for type 2 diabetes, heart disease, and stroke.  With the DASH eating plan, you should limit salt (sodium) intake to 2,300 mg a day. If you have hypertension, you may need to reduce your sodium intake to 1,500 mg a day.  When on the DASH eating plan, aim to eat more fresh fruits and vegetables, whole grains, lean proteins, low-fat dairy, and heart-healthy  fats.  Work with your health care provider or diet and nutrition specialist (dietitian) to adjust your eating plan to your individual calorie needs. This information is not intended to replace advice given to you by your health care provider. Make sure you discuss any questions you have with your health care provider. Document Revised: 08/29/2017 Document Reviewed: 09/09/2016 Elsevier Patient Education  [employer].      High-Fiber Diet Fiber, also called dietary fiber, is a type of carbohydrate that is found in fruits, vegetables, whole grains, and beans. A high-fiber diet can have many health benefits. Your health care provider may recommend a high-fiber diet to help:  Prevent constipation. Fiber can make your bowel movements more regular.  Lower your cholesterol.  Relieve the following conditions: ? Swelling of veins in the anus (hemorrhoids). ? Swelling and irritation (inflammation) of specific areas of the digestive tract (uncomplicated diverticulosis). ? A problem of the large intestine (colon) that sometimes causes pain and diarrhea (irritable bowel syndrome, IBS).  Prevent overeating as part of a weight-loss plan.  Prevent heart disease, type 2 diabetes, and certain cancers. What is my plan? The recommended daily fiber intake in grams (g) includes:  38 g for men age 37 or younger.  30 g for men over age 35.  20 g for women age 28 or younger.  21 g for women over age 10. You can get the recommended daily intake of dietary fiber by:  Eating a variety of fruits, vegetables, grains, and beans.  Taking a fiber supplement, if it is not possible to get enough fiber through your diet. What do I need to know about a high-fiber diet?  It is better to get fiber through food sources rather than from fiber supplements. There is not a lot of research about how effective supplements are.  Always check the fiber content on the nutrition facts label of any prepackaged food.  Look for foods that contain 5 g of fiber or more per serving.  Talk with a diet and nutrition specialist (dietitian) if you have questions about specific foods that are recommended or not recommended for your medical condition, especially if those foods are not listed below.  Gradually increase how much fiber you consume. If you increase your intake of dietary fiber too quickly, you may have bloating, cramping, or gas.  Drink plenty of water. Water helps you to digest fiber. What are tips for following this plan?  Eat a wide variety of high-fiber foods.  Make sure that half of the grains that you eat each day are whole grains.  Eat breads and cereals that are made with whole-grain flour instead of refined flour or white flour.  Eat brown rice, bulgur wheat, or millet instead of white rice.  Start the day with a breakfast that is high in fiber, such as a cereal that contains 5 g of fiber or more per serving.  Use  beans in place of meat in soups, salads, and pasta dishes.  Eat high-fiber snacks, such as berries, raw vegetables, nuts, and popcorn.  Choose whole fruits and vegetables instead of processed forms like juice or sauce. What foods can I eat?  Fruits Berries. Pears. Apples. Oranges. Avocado. Prunes and raisins. Dried figs. Vegetables Sweet potatoes. Spinach. Kale. Artichokes. Cabbage. Broccoli. Cauliflower. Green peas. Carrots. Squash. Grains Whole-grain breads. Multigrain cereal. Oats and oatmeal. Brown rice. Barley. Bulgur wheat. Odell. Quinoa. Bran muffins. Popcorn. Rye wafer crackers. Meats and other proteins Navy, kidney, and pinto beans. Soybeans. Split peas. Lentils. Nuts and seeds. Dairy Fiber-fortified yogurt. Beverages Fiber-fortified soy milk. Fiber-fortified orange juice. Other foods Fiber bars. The items listed above may not be a complete list of recommended foods and beverages. Contact a dietitian for more options. What foods are not  recommended? Fruits Fruit juice. Cooked, strained fruit. Vegetables Fried potatoes. Canned vegetables. Well-cooked vegetables. Grains White bread. Pasta made with refined flour. White rice. Meats and other proteins Fatty cuts of meat. Fried chicken or fried fish. Dairy Milk. Yogurt. Cream cheese. Sour cream. Fats and oils Butters. Beverages Soft drinks. Other foods Cakes and pastries. The items listed above may not be a complete list of foods and beverages to avoid. Contact a dietitian for more information. Summary  Fiber is a type of carbohydrate. It is found in fruits, vegetables, whole grains, and beans.  There are many health benefits of eating a high-fiber diet, such as preventing constipation, lowering blood cholesterol, helping with weight loss, and reducing your risk of heart disease, diabetes, and certain cancers.  Gradually increase your intake of fiber. Increasing too fast can result in cramping, bloating, and gas. Drink plenty of water while you increase your fiber.  The best sources of fiber include whole fruits and vegetables, whole grains, nuts, seeds, and beans. This information is not intended to replace advice given to you by your health care provider. Make sure you discuss any questions you have with your health care provider. Document Revised: 07/21/2017 Document Reviewed: 07/21/2017 Elsevier Patient Education  2020 [employer].

## 2020-06-02 DIAGNOSIS — I25118 Atherosclerotic heart disease of native coronary artery with other forms of angina pectoris: Secondary | ICD-10-CM | POA: Diagnosis not present

## 2020-06-12 ENCOUNTER — Telehealth (HOSPITAL_COMMUNITY): Payer: Self-pay | Admitting: *Deleted

## 2020-06-12 NOTE — Telephone Encounter (Signed)
Received referral from Dr. Claiborne Billings for this pt to participate in Cardiac rehab after his STEMI/DES in august.  Prior to this cardiac event, pt sees Dr. Michiel Cowboy at [facility] for cardiology needs.No other appt is noted in epic for local cardiologist.  Pt completed follow up appt on 9/3 with Dr. Michiel Cowboy,  Called pt to confirm who he plans to see for his cardiology needs.  If he plans to continue to see Dr. Michiel Cowboy, will need new referral MD order signed and returned along with requested 12 lead ekg tracing.  Message left with contact information provided. Cherre Huger, BSN Cardiac and Training and development officer

## 2020-06-19 ENCOUNTER — Encounter (HOSPITAL_COMMUNITY): Payer: Self-pay

## 2020-06-22 ENCOUNTER — Telehealth (HOSPITAL_COMMUNITY): Payer: Self-pay

## 2020-06-22 NOTE — Telephone Encounter (Signed)
Pt returned CR phone, stated he is unable to participate at this time due to his co-pay. Will call to schedule when he is ready.  Closed referral

## 2020-07-31 ENCOUNTER — Ambulatory Visit: Payer: Medicare HMO | Admitting: Internal Medicine

## 2020-08-01 ENCOUNTER — Other Ambulatory Visit: Payer: Self-pay

## 2020-08-01 ENCOUNTER — Encounter: Payer: Self-pay | Admitting: Internal Medicine

## 2020-08-01 ENCOUNTER — Ambulatory Visit (INDEPENDENT_AMBULATORY_CARE_PROVIDER_SITE_OTHER): Payer: Medicare HMO | Admitting: Internal Medicine

## 2020-08-01 VITALS — BP 102/60 | HR 80 | Temp 97.2°F | Resp 16 | Ht 70.0 in | Wt 158.6 lb

## 2020-08-01 DIAGNOSIS — I2510 Atherosclerotic heart disease of native coronary artery without angina pectoris: Secondary | ICD-10-CM | POA: Diagnosis not present

## 2020-08-01 DIAGNOSIS — Z79899 Other long term (current) drug therapy: Secondary | ICD-10-CM | POA: Diagnosis not present

## 2020-08-01 DIAGNOSIS — R7303 Prediabetes: Secondary | ICD-10-CM | POA: Diagnosis not present

## 2020-08-01 DIAGNOSIS — E559 Vitamin D deficiency, unspecified: Secondary | ICD-10-CM | POA: Diagnosis not present

## 2020-08-01 DIAGNOSIS — E782 Mixed hyperlipidemia: Secondary | ICD-10-CM | POA: Diagnosis not present

## 2020-08-01 DIAGNOSIS — R7309 Other abnormal glucose: Secondary | ICD-10-CM

## 2020-08-01 DIAGNOSIS — I10 Essential (primary) hypertension: Secondary | ICD-10-CM

## 2020-08-01 DIAGNOSIS — T7840XA Allergy, unspecified, initial encounter: Secondary | ICD-10-CM

## 2020-08-01 MED ORDER — DEXAMETHASONE 4 MG PO TABS: ORAL_TABLET | ORAL | 0 refills | Status: DC

## 2020-08-01 MED ORDER — CILOSTAZOL 100 MG PO TABS: ORAL_TABLET | ORAL | 0 refills | Status: DC

## 2020-08-01 MED ORDER — LISINOPRIL-HYDROCHLOROTHIAZIDE 20-12.5 MG PO TABS: ORAL_TABLET | ORAL | 0 refills | Status: DC

## 2020-08-01 MED ORDER — CYPROHEPTADINE HCL 4 MG PO TABS: ORAL_TABLET | ORAL | 0 refills | Status: DC

## 2020-08-01 MED ORDER — CLOPIDOGREL BISULFATE 75 MG PO TABS: ORAL_TABLET | ORAL | 0 refills | Status: AC

## 2020-08-01 NOTE — Progress Notes (Signed)
History of Present Illness:       This very nice 69 y.o. MBM presents for 6 month follow up with HTN, HLD, Pre-Diabetes and Vitamin D Deficiency.       Patient is treated for HTN & BP has been controlled at home. Today's BP  is at goal -  102/60.  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 Apparently patient is off Brilinta for cost concerns & is on TAPT with LD bASA, Clopidogrel & Cilostazol.          Patient was recently prescribed low dose Coreg 6.25 mg and has developed moderately severe itching & hives refractory to Benadryl, Zyrtec & Vistaril & a shot of Medrol 125 mg at an Urgent care  And also has been relatively posturally hypotensive by  Numerous measurements brought in today. And he wishes to d/cthe Coreg. It also appears that he is concomitantly taking Toprol 25 mg XL daily which he is advised to continue taking.    Patient has had no current complaints of any cardiac type chest pain, palpitations, dyspnea Vertell Limber /PND, dizziness, claudication, or dependent edema.      Hyperlipidemia is not controlled with diet & meds. Patient endorse moderate muscle cramps on Atorvastatin 80 mg.  Last Lipids were not at goal:  Lab Results  Component Value Date   CHOL 229 (H) 05/05/2020   HDL 59 05/05/2020   LDLCALC 145 (H) 05/05/2020   TRIG 123 05/05/2020   CHOLHDL 3.9 05/05/2020    Also, the patient has history of  PreDiabetes and has had no symptoms of reactive hypoglycemia, diabetic polys, paresthesias or visual blurring.  Last A1c was not at goal:  Lab Results  Component Value Date   HGBA1C 6.1 (H) 05/05/2020        Further, the patient also has history of Vitamin D Deficiency and supplements vitamin D without any suspected side-effects. Last vitamin D was slightly elevated  & dose was  decreased:  Lab Results  Component Value Date   VD25OH 116 (H) 01/10/2020    Current Outpatient Medications on File Prior to Visit  Medication Sig  . aVITAMIN C 1000 MG tablet Take 1 tablet  daily.  Marland Kitchen aspirin EC 81 MG tablet Take  daily.   Marland Kitchen atorvastatin 80 MG tablet Take 1 tablet Daily f  . Bioflavonoid VIT C PLUS 1000 MG  Take 1 tablet Daily  . VITAMIN D 5000 U T Take 1 tablet  daily.  Marland Kitchen ezetimibe  10 MG tablet Take 10 mg daily.  . metoprolol succ-XL) 25 MG 2 Take 1 tablet  daily.  . MultiVit-Minerals  Take 1 tablet Daily   . NITROSTAT 0.4 MG SL  as needed for chest pain.    PMHx:   Past Medical History:  Diagnosis Date  . Hyperlipidemia   . Hypertension 2010  . Myocardial infarction ([place]) 2015  . Prediabetes 2015    Immunization History  Administered Date(s) Administered  . PFIZER SARS-COV-2 Vaccination 11/04/2019, 11/25/2019, 07/09/2020  . Tetanus 09/30/2014    Past Surgical History:  Procedure Laterality Date  . CORONARY STENT INTERVENTION N/A 05/05/2020   Procedure: CORONARY STENT INTERVENTION;  Surgeon: Troy Sine, MD;  Location: [facility];  Service: Cardiovascular;  Laterality: N/A;  . CORONARY/GRAFT ACUTE MI REVASCULARIZATION N/A 05/05/2020   Procedure: Coronary/Graft Acute MI Revascularization;  Surgeon: Troy Sine, MD;  Location: [facility];  Service: Cardiovascular;  Laterality: N/A;  . LEFT HEART CATH AND CORONARY ANGIOGRAPHY N/A 05/05/2020   Procedure: LEFT HEART CATH AND CORONARY ANGIOGRAPHY;  Surgeon: Troy Sine, MD;  Location: [facility];  Service: Cardiovascular;  Laterality: N/A;    FHx:    Reviewed / unchanged  SHx:    Reviewed / unchanged   Systems Review:  Constitutional: Denies fever, chills, wt changes, headaches, insomnia, fatigue, night sweats, change in appetite. Eyes: Denies redness, blurred vision, diplopia, discharge, itchy, watery eyes.  ENT: Denies discharge, congestion, post nasal drip, epistaxis,  sore throat, earache, hearing loss, dental pain, tinnitus, vertigo, sinus pain, snoring.  CV: Denies chest pain, palpitations, irregular heartbeat, syncope, dyspnea, diaphoresis, orthopnea, PND, claudication or edema. Respiratory: denies cough, dyspnea, DOE, pleurisy, hoarseness, laryngitis, wheezing.  Gastrointestinal: Denies dysphagia, odynophagia, heartburn, reflux, water brash, abdominal pain or cramps, nausea, vomiting, bloating, diarrhea, constipation, hematemesis, melena, hematochezia  or hemorrhoids. Genitourinary: Denies dysuria, frequency, urgency, nocturia, hesitancy, discharge, hematuria or flank pain. Musculoskeletal: Denies arthralgias, myalgias, stiffness, jt. swelling, pain, limping or strain/sprain.  Skin: Denies pruritus, rash, hives, warts, acne, eczema or change in skin lesion(s). Neuro: No weakness, tremor, incoordination, spasms, paresthesia or pain. Psychiatric: Denies confusion, memory loss or sensory loss. Endo: Denies change in weight, skin or hair change.  Heme/Lymph: No excessive bleeding, bruising or enlarged lymph nodes.  Physical Exam  BP 102/60   Pulse 80   Temp (!) 97.2 F (36.2 C)   Resp 16   Ht 5\' 10"  (1.778 m)   Wt 158 lb 9.6 oz (71.9 kg)   SpO2 96%   BMI 22.76 kg/m   Appears  well nourished, well groomed  and in no distress.  Eyes: PERRLA, EOMs, conjunctiva no swelling or erythema. Sinuses: No frontal/maxillary tenderness ENT/Mouth: EAC's clear, TM's nl w/o erythema, bulging. Nares clear w/o erythema, swelling, exudates. Oropharynx clear without erythema or exudates. Oral hygiene is good. Tongue normal, non obstructing. Hearing intact.  Neck: Supple. Thyroid not palpable. Car 2+/2+ without bruits, nodes or JVD. Chest: Respirations nl with BS clear & equal w/o rales, rhonchi, wheezing or stridor.  Cor: Heart sounds normal w/ regular rate and rhythm without sig. murmurs, gallops, clicks or rubs. Peripheral pulses 1+/1+ and equal  without edema.   Abdomen: Soft & bowel sounds normal. Non-tender w/o guarding, rebound, hernias, masses or organomegaly.  Lymphatics: Unremarkable.  Musculoskeletal: Full ROM all peripheral extremities, joint stability, 5/5 strength and normal gait.  Skin: Warm, dry without exposed rashes, lesions or ecchymosis apparent.  Neuro: Cranial nerves intact, reflexes equal bilaterally. Sensory-motor testing grossly intact. Tendon reflexes grossly intact.  Pysch: Alert & oriented x 3.  Insight and judgement nl & appropriate. No ideations.  Assessment and Plan:  1. Essential hypertension  - Advised stop Coreg, Continue Metoprolol and continue   monitor  Postural BP's at home.   - Continue DASH diet.  Reminder to go to the ER if any CP,  SOB, nausea, dizziness, severe HA, changes vision/speech.  - CBC with Differential/Platelet - COMPLETE METABOLIC PANEL WITH GFR - Magnesium - TSH  2. Hyperlipidemia, mixed  - Continue diet/meds, exercise,& lifestyle modifications.  - Continue monitor periodic cholesterol/liver & renal functions   - Lipid panel - TSH  3. Abnormal glucose  - Hemoglobin A1c - Insulin, random  4. Vitamin D  deficiency  - Continue diet, exercise  - Lifestyle modifications.  - Monitor appropriate labs. - Continue supplementation.  - VITAMIN D 25 Hydroxy   5. Prediabetes  - Hemoglobin A1c - Insulin, random  6. Coronary artery disease   - Lipid panel  7. Medication management  - CBC with Differential/Platelet - COMPLETE METABOLIC PANEL WITH GFR - Magnesium - Lipid panel - TSH - Hemoglobin A1c - Insulin, random - VITAMIN D 25 Hydroxy           Discussed  regular exercise, BP monitoring, weight control to achieve/maintain BMI less than 25 and discussed med and SE's. Recommended labs to assess and monitor clinical status with further disposition pending results of labs.  I discussed the assessment and treatment plan with the patient. The patient was provided an  opportunity to ask questions and all were answered. The patient agreed with the plan and demonstrated an understanding of the instructions.  I provided over 30 minutes of exam, counseling, chart review and  complex critical decision making.         Patient is given new Rx for Decadron pulse /taper & Periactin for suspected allergic drug reaction.  The patient was advised to call back or seek an in-person evaluation if the symptoms worsen or if the condition fails to improve as anticipated.   Kirtland Bouchard, MD

## 2020-08-01 NOTE — Patient Instructions (Signed)

## 2020-08-02 ENCOUNTER — Telehealth: Payer: Self-pay | Admitting: *Deleted

## 2020-08-02 ENCOUNTER — Other Ambulatory Visit: Payer: Self-pay | Admitting: Internal Medicine

## 2020-08-02 DIAGNOSIS — D649 Anemia, unspecified: Secondary | ICD-10-CM

## 2020-08-02 DIAGNOSIS — D529 Folate deficiency anemia, unspecified: Secondary | ICD-10-CM

## 2020-08-02 DIAGNOSIS — D509 Iron deficiency anemia, unspecified: Secondary | ICD-10-CM

## 2020-08-02 DIAGNOSIS — D519 Vitamin B12 deficiency anemia, unspecified: Secondary | ICD-10-CM

## 2020-08-02 LAB — COMPLETE METABOLIC PANEL WITH GFR
AG Ratio: 2.1 (calc) (ref 1.0–2.5)
ALT: 26 U/L (ref 9–46)
AST: 28 U/L (ref 10–35)
Albumin: 4.2 g/dL (ref 3.6–5.1)
Alkaline phosphatase (APISO): 87 U/L (ref 35–144)
BUN/Creatinine Ratio: 20 (calc) (ref 6–22)
BUN: 29 mg/dL — ABNORMAL HIGH (ref 7–25)
CO2: 28 mmol/L (ref 20–32)
Calcium: 9.7 mg/dL (ref 8.6–10.3)
Chloride: 102 mmol/L (ref 98–110)
Creat: 1.45 mg/dL — ABNORMAL HIGH (ref 0.70–1.25)
GFR, Est African American: 58 mL/min/{1.73_m2} — ABNORMAL LOW (ref >=60)
GFR, Est Non African American: 50 mL/min/{1.73_m2} — ABNORMAL LOW (ref >=60)
Globulin: 2 g/dL (calc) (ref 1.9–3.7)
Glucose, Bld: 110 mg/dL — ABNORMAL HIGH (ref 65–99)
Potassium: 4.6 mmol/L (ref 3.5–5.3)
Sodium: 138 mmol/L (ref 135–146)
Total Bilirubin: 0.3 mg/dL (ref 0.2–1.2)
Total Protein: 6.2 g/dL (ref 6.1–8.1)

## 2020-08-02 LAB — CBC WITH DIFFERENTIAL/PLATELET
Absolute Monocytes: 566 cells/uL (ref 200–950)
Basophils Absolute: 48 cells/uL (ref 0–200)
Basophils Relative: 1 %
Eosinophils Absolute: 619 cells/uL — ABNORMAL HIGH (ref 15–500)
Eosinophils Relative: 12.9 %
HCT: 34.4 % — ABNORMAL LOW (ref 38.5–50.0)
Hemoglobin: 11.3 g/dL — ABNORMAL LOW (ref 13.2–17.1)
Lymphs Abs: 1363 cells/uL (ref 850–3900)
MCH: 30.1 pg (ref 27.0–33.0)
MCHC: 32.8 g/dL (ref 32.0–36.0)
MCV: 91.7 fL (ref 80.0–100.0)
MPV: 10 fL (ref 7.5–12.5)
Monocytes Relative: 11.8 %
Neutro Abs: 2203 cells/uL (ref 1500–7800)
Neutrophils Relative %: 45.9 %
Platelets: 244 10*3/uL (ref 140–400)
RBC: 3.75 10*6/uL — ABNORMAL LOW (ref 4.20–5.80)
RDW: 12.7 % (ref 11.0–15.0)
Total Lymphocyte: 28.4 %
WBC: 4.8 10*3/uL (ref 3.8–10.8)

## 2020-08-02 LAB — VITAMIN D 25 HYDROXY (VIT D DEFICIENCY, FRACTURES): Vit D, 25-Hydroxy: 45 ng/mL (ref 30–100)

## 2020-08-02 LAB — HEMOGLOBIN A1C
Hgb A1c MFr Bld: 6.2 % of total Hgb — ABNORMAL HIGH (ref <5.7)
Mean Plasma Glucose: 131 (calc)
eAG (mmol/L): 7.3 (calc)

## 2020-08-02 LAB — LIPID PANEL
Cholesterol: 106 mg/dL (ref <200)
HDL: 39 mg/dL — ABNORMAL LOW (ref >=40)
LDL Cholesterol (Calc): 42 mg/dL (calc)
Non-HDL Cholesterol (Calc): 67 mg/dL (calc) (ref <130)
Total CHOL/HDL Ratio: 2.7 (calc) (ref <5.0)
Triglycerides: 176 mg/dL — ABNORMAL HIGH (ref <150)

## 2020-08-02 LAB — MAGNESIUM: Magnesium: 1.9 mg/dL (ref 1.5–2.5)

## 2020-08-02 LAB — INSULIN, RANDOM: Insulin: 40.3 u[IU]/mL — ABNORMAL HIGH

## 2020-08-02 LAB — TSH: TSH: 1.61 mIU/L (ref 0.40–4.50)

## 2020-08-02 NOTE — Progress Notes (Signed)
========================================================== -   Test results slightly outside the reference range are not unusual. If there is anything important, I will review this with you,  otherwise it is considered normal test values.  If you have further questions,  please do not hesitate to contact me at the office or via My Chart.  ==========================================================  -  CBC shows a new anemia , Please call office to schedule a lab visit in a week to recheck labs for anemia.  ==========================================================  -  Total Chol = 106 and LDL = 46 = Both  Excellent   - Very low risk for Heart Attack  / Stroke =========================================================  - A1c up slightly from 6.0 and 6.1% to now 6.2%  ==========================================================  -  Vitamin D = 45- Low ( Ideal or goal is between 70-100)  - So recommend that you increase and add an extra 2,000 units more /day.  ==========================================================  -  All Else - CBC - Kidneys - Electrolytes - Liver - Magnesium & Thyroid    - all  Normal / OK ==========================================================

## 2020-08-02 NOTE — Telephone Encounter (Signed)
Left a message to inform the patient that Dr Melford Aase sent him a [place] coupon to pay for medication, since it is not on his plan.

## 2020-08-22 ENCOUNTER — Telehealth: Payer: Self-pay | Admitting: *Deleted

## 2020-08-22 ENCOUNTER — Other Ambulatory Visit: Payer: Self-pay | Admitting: Internal Medicine

## 2020-08-22 DIAGNOSIS — D509 Iron deficiency anemia, unspecified: Secondary | ICD-10-CM

## 2020-08-22 DIAGNOSIS — D519 Vitamin B12 deficiency anemia, unspecified: Secondary | ICD-10-CM

## 2020-08-22 DIAGNOSIS — D529 Folate deficiency anemia, unspecified: Secondary | ICD-10-CM

## 2020-08-22 DIAGNOSIS — D649 Anemia, unspecified: Secondary | ICD-10-CM

## 2020-08-22 DIAGNOSIS — T7840XA Allergy, unspecified, initial encounter: Secondary | ICD-10-CM

## 2020-08-22 MED ORDER — CYPROHEPTADINE HCL 4 MG PO TABS: ORAL_TABLET | ORAL | 0 refills | Status: DC

## 2020-08-22 NOTE — Telephone Encounter (Signed)
Patient called and states his rash and itching have returned since stopping Periactin.  Dr Melford Aase refilled Periactin and advised the patient he will need to see a dermatologist. The patient plans to call a dermatologist to shedule his own appointment.

## 2020-08-28 ENCOUNTER — Other Ambulatory Visit: Payer: Self-pay | Admitting: Internal Medicine

## 2020-08-28 ENCOUNTER — Other Ambulatory Visit: Payer: Self-pay

## 2020-08-28 ENCOUNTER — Ambulatory Visit (INDEPENDENT_AMBULATORY_CARE_PROVIDER_SITE_OTHER): Payer: Medicare HMO

## 2020-08-28 DIAGNOSIS — D509 Iron deficiency anemia, unspecified: Secondary | ICD-10-CM | POA: Diagnosis not present

## 2020-08-28 DIAGNOSIS — T7840XS Allergy, unspecified, sequela: Secondary | ICD-10-CM

## 2020-08-28 DIAGNOSIS — D519 Vitamin B12 deficiency anemia, unspecified: Secondary | ICD-10-CM | POA: Diagnosis not present

## 2020-08-28 DIAGNOSIS — D529 Folate deficiency anemia, unspecified: Secondary | ICD-10-CM | POA: Diagnosis not present

## 2020-08-28 DIAGNOSIS — D649 Anemia, unspecified: Secondary | ICD-10-CM | POA: Diagnosis not present

## 2020-08-28 MED ORDER — HYDROXYZINE HCL 25 MG PO TABS: ORAL_TABLET | ORAL | 0 refills | Status: DC

## 2020-08-28 MED ORDER — MONTELUKAST SODIUM 10 MG PO TABS: ORAL_TABLET | ORAL | 0 refills | Status: DC

## 2020-08-28 NOTE — Progress Notes (Signed)
Patient presents to the office for a nurse visit to have labs done. During patient's visit, he states that the medication for the itchiness isn't working and is not seeing Dermatology until Jan. Requesting something be sent in for him.

## 2020-08-29 NOTE — Progress Notes (Signed)
========================================================== -   Test results slightly outside the reference range are not unusual. If there is anything important, I will review this with you,  otherwise it is considered normal test values.  If you have further questions,  please do not hesitate to contact me at the office or via My Chart.  ==========================================================  -  CBC - shows Red cell count stable ==========================================================  -  Vitamin B12, Folate  level  & Iron levels - All Normal & OK  ==========================================================

## 2020-08-30 LAB — CBC WITH DIFFERENTIAL/PLATELET
Absolute Monocytes: 721 cells/uL (ref 200–950)
Basophils Absolute: 63 cells/uL (ref 0–200)
Basophils Relative: 0.9 %
Eosinophils Absolute: 1736 cells/uL — ABNORMAL HIGH (ref 15–500)
Eosinophils Relative: 24.8 %
HCT: 34.7 % — ABNORMAL LOW (ref 38.5–50.0)
Hemoglobin: 11.6 g/dL — ABNORMAL LOW (ref 13.2–17.1)
Lymphs Abs: 1589 cells/uL (ref 850–3900)
MCH: 30.4 pg (ref 27.0–33.0)
MCHC: 33.4 g/dL (ref 32.0–36.0)
MCV: 90.8 fL (ref 80.0–100.0)
MPV: 10 fL (ref 7.5–12.5)
Monocytes Relative: 10.3 %
Neutro Abs: 2891 cells/uL (ref 1500–7800)
Neutrophils Relative %: 41.3 %
Platelets: 323 10*3/uL (ref 140–400)
RBC: 3.82 10*6/uL — ABNORMAL LOW (ref 4.20–5.80)
RDW: 12.5 % (ref 11.0–15.0)
Total Lymphocyte: 22.7 %
WBC: 7 10*3/uL (ref 3.8–10.8)

## 2020-08-30 LAB — IRON, TOTAL/TOTAL IRON BINDING CAP
%SAT: 22 % (calc) (ref 20–48)
Iron: 75 ug/dL (ref 50–180)
TIBC: 345 mcg/dL (calc) (ref 250–425)

## 2020-08-30 LAB — METHYLMALONIC ACID, SERUM: Methylmalonic Acid, Quant: 127 nmol/L (ref 87–318)

## 2020-08-30 LAB — RETICULOCYTES
ABS Retic: 87860 cells/uL (ref 25000–9000)
Retic Ct Pct: 2.3 %

## 2020-08-30 LAB — VITAMIN B12: Vitamin B-12: 1379 pg/mL — ABNORMAL HIGH (ref 200–1100)

## 2020-08-30 LAB — FOLATE RBC: RBC Folate: 802 ng/mL RBC (ref >280)

## 2020-09-04 DIAGNOSIS — L309 Dermatitis, unspecified: Secondary | ICD-10-CM | POA: Diagnosis not present

## 2020-10-03 DIAGNOSIS — M25511 Pain in right shoulder: Secondary | ICD-10-CM | POA: Diagnosis not present

## 2020-10-03 DIAGNOSIS — S46011D Strain of muscle(s) and tendon(s) of the rotator cuff of right shoulder, subsequent encounter: Secondary | ICD-10-CM | POA: Diagnosis not present

## 2020-10-03 DIAGNOSIS — M542 Cervicalgia: Secondary | ICD-10-CM | POA: Diagnosis not present

## 2020-10-05 DIAGNOSIS — L309 Dermatitis, unspecified: Secondary | ICD-10-CM | POA: Diagnosis not present

## 2020-10-05 DIAGNOSIS — L853 Xerosis cutis: Secondary | ICD-10-CM | POA: Diagnosis not present

## 2020-10-31 DIAGNOSIS — H5203 Hypermetropia, bilateral: Secondary | ICD-10-CM | POA: Diagnosis not present

## 2020-11-06 ENCOUNTER — Encounter: Payer: Self-pay | Admitting: Adult Health Nurse Practitioner

## 2020-11-06 ENCOUNTER — Other Ambulatory Visit: Payer: Self-pay

## 2020-11-06 ENCOUNTER — Ambulatory Visit (INDEPENDENT_AMBULATORY_CARE_PROVIDER_SITE_OTHER): Payer: Medicare HMO | Admitting: Adult Health Nurse Practitioner

## 2020-11-06 VITALS — BP 116/74 | HR 77 | Ht 70.0 in | Wt 160.0 lb

## 2020-11-06 DIAGNOSIS — E782 Mixed hyperlipidemia: Secondary | ICD-10-CM | POA: Diagnosis not present

## 2020-11-06 DIAGNOSIS — D509 Iron deficiency anemia, unspecified: Secondary | ICD-10-CM | POA: Diagnosis not present

## 2020-11-06 DIAGNOSIS — I10 Essential (primary) hypertension: Secondary | ICD-10-CM | POA: Diagnosis not present

## 2020-11-06 DIAGNOSIS — R7309 Other abnormal glucose: Secondary | ICD-10-CM

## 2020-11-06 DIAGNOSIS — N1831 Chronic kidney disease, stage 3a: Secondary | ICD-10-CM

## 2020-11-06 DIAGNOSIS — Z6822 Body mass index (BMI) 22.0-22.9, adult: Secondary | ICD-10-CM | POA: Diagnosis not present

## 2020-11-06 DIAGNOSIS — I2510 Atherosclerotic heart disease of native coronary artery without angina pectoris: Secondary | ICD-10-CM | POA: Diagnosis not present

## 2020-11-06 DIAGNOSIS — E559 Vitamin D deficiency, unspecified: Secondary | ICD-10-CM

## 2020-11-06 DIAGNOSIS — Z79899 Other long term (current) drug therapy: Secondary | ICD-10-CM

## 2020-11-06 DIAGNOSIS — Z87891 Personal history of nicotine dependence: Secondary | ICD-10-CM | POA: Diagnosis not present

## 2020-11-06 DIAGNOSIS — I739 Peripheral vascular disease, unspecified: Secondary | ICD-10-CM | POA: Diagnosis not present

## 2020-11-06 DIAGNOSIS — Z0000 Encounter for general adult medical examination without abnormal findings: Secondary | ICD-10-CM
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
## 2020-11-07 LAB — CBC WITH DIFFERENTIAL/PLATELET
Absolute Monocytes: 735 cells/uL (ref 200–950)
Basophils Absolute: 39 cells/uL (ref 0–200)
Basophils Relative: 0.8 %
Eosinophils Absolute: 230 cells/uL (ref 15–500)
Eosinophils Relative: 4.7 %
HCT: 36.2 % — ABNORMAL LOW (ref 38.5–50.0)
Hemoglobin: 12.2 g/dL — ABNORMAL LOW (ref 13.2–17.1)
Lymphs Abs: 1901 cells/uL (ref 850–3900)
MCH: 29.8 pg (ref 27.0–33.0)
MCHC: 33.7 g/dL (ref 32.0–36.0)
MCV: 88.5 fL (ref 80.0–100.0)
MPV: 10.8 fL (ref 7.5–12.5)
Monocytes Relative: 15 %
Neutro Abs: 1994 cells/uL (ref 1500–7800)
Neutrophils Relative %: 40.7 %
Platelets: 256 10*3/uL (ref 140–400)
RBC: 4.09 10*6/uL — ABNORMAL LOW (ref 4.20–5.80)
RDW: 12.1 % (ref 11.0–15.0)
Total Lymphocyte: 38.8 %
WBC: 4.9 10*3/uL (ref 3.8–10.8)

## 2020-11-07 LAB — COMPLETE METABOLIC PANEL WITH GFR
AG Ratio: 1.6 (calc) (ref 1.0–2.5)
ALT: 18 U/L (ref 9–46)
AST: 19 U/L (ref 10–35)
Albumin: 4.1 g/dL (ref 3.6–5.1)
Alkaline phosphatase (APISO): 90 U/L (ref 35–144)
BUN/Creatinine Ratio: 24 (calc) — ABNORMAL HIGH (ref 6–22)
BUN: 32 mg/dL — ABNORMAL HIGH (ref 7–25)
CO2: 30 mmol/L (ref 20–32)
Calcium: 10 mg/dL (ref 8.6–10.3)
Chloride: 101 mmol/L (ref 98–110)
Creat: 1.35 mg/dL — ABNORMAL HIGH (ref 0.70–1.25)
GFR, Est African American: 63 mL/min/{1.73_m2} (ref >=60)
GFR, Est Non African American: 54 mL/min/{1.73_m2} — ABNORMAL LOW (ref >=60)
Globulin: 2.5 g/dL (calc) (ref 1.9–3.7)
Glucose, Bld: 97 mg/dL (ref 65–99)
Potassium: 5 mmol/L (ref 3.5–5.3)
Sodium: 137 mmol/L (ref 135–146)
Total Bilirubin: 0.3 mg/dL (ref 0.2–1.2)
Total Protein: 6.6 g/dL (ref 6.1–8.1)

## 2020-11-07 LAB — LIPID PANEL
Cholesterol: 138 mg/dL (ref <200)
HDL: 44 mg/dL (ref >=40)
LDL Cholesterol (Calc): 72 mg/dL (calc)
Non-HDL Cholesterol (Calc): 94 mg/dL (calc) (ref <130)
Total CHOL/HDL Ratio: 3.1 (calc) (ref <5.0)
Triglycerides: 139 mg/dL (ref <150)

## 2020-11-28 NOTE — Telephone Encounter (Signed)
Patient has been scheduled an appt to speak with provider about POSS EKG.

## 2020-11-28 NOTE — Telephone Encounter (Signed)
-----   Message from Garnet Sierras, NP sent at 11/28/2020  1:16 PM EST ----- Regarding: RE: EKG Who is requesting a call the NP or the patient.  Is that employee health?   Sincerely,           Garnet Sierras, NP   ----- Message ----- From: Elenor Quinones, CMA Sent: 11/28/2020  12:12 PM EST To: Garnet Sierras, NP Subject: EKG                                            [facility]  Pulse-66 Blood pressure---102/60 Asymptomatic  Has requested a call  [phone]

## 2020-11-29 NOTE — Patient Instructions (Signed)

## 2020-11-29 NOTE — Progress Notes (Signed)
   History of Present Illness:                       This very nice 70 y.o. MBM  with HTN, ASCAD, HLD, Pre-Diabetes and Vitamin D Deficiency who presented with ACS (2016 and underwent PCA & stent w/DES.  In 04/2020 , he had a 2sd STEMI. He also has hx/o ASPVD s/p PCA of SFA occlusions. Patrick Tucker is on DAPT & bASA  with  Clopidogrel & Cilostazol.  He presents today  For concerns re: occasional low BP's. He denies any postural sx's or lightheadedness.  A nurse at work checking hois BP felt his pulse was irregular & that he should be checked.       Medications  .  atorvastatin  80 MG tablet, Take 1 tablet Daily f .  ezetimibe  10 MG t, Take 10 mg  daily. Marland Kitchen  lisinopril-hctz 20-12.5 MG , Take    1 tablet Daily   .  metoprolol succ-XL 25 MG , Take 1 tablet  daily. Marland Kitchen  NITROSTAT 0.4 MG SL ,  as needed for chest pain. .  montelukast 10 MG tablet, Take 1 tablet Daily   .  aspirin EC 81 MG , Take 1 tablet  daily  .  cilostazol (PLETAL) 100 MG , Take    1 tablet 2 x /day  .  clopidogrel (PLAVIX) 75 MG , Take 1 tablet Daily  .  VITAMIN C 1000 MG , Take 1 tablet  daily. Marland Kitchen  VITAMIN C 1000 MG , Take 1 tablet Daily .  VITAMIN D 5000 u, Take 1 tablet  daily. .  Multi-Vit-Minerals , Take 1 tablet Daily   Problem list He has Hypertension; Hyperlipidemia; Vitamin D deficiency; Former smoker; CAD (coronary artery disease); PAD (peripheral artery disease) ([place]); ST elevation myocardial infarction (STEMI) ([place]); and Acute systolic congestive heart failure ([place]) on their problem list.   Observations/Objective:  BP 126/70   Pulse (!) 58   Temp (!) 97 F (36.1 C)   Resp 16   Ht 5\' 10"  (1.778 m)   Wt 160 lb 9.6 oz (72.8 kg)   SpO2 98%   BMI 23.04 kg/m   HEENT - WNL. Neck - supple.  Chest - Clear equal BS. Cor - Nl HS. RRR w/o sig MGR. PP 1(+). No edema. MS- FROM w/o deformities.  Gait Nl. Neuro -  Nl w/o focal abnormalities.  EKG - NSR - WNL   Assessment and Plan:  1. Essential  hypertension  - Magnesium - TSH  2. Hyperlipidemia, mixed  - TSH  3. Abnormal glucose  - Hemoglobin A1c  4. Vitamin D deficiency  - VITAMIN D 25 Hydroxy  5. Coronary artery disease involving native coronary  artery without angina pectoris  - EKG 12-Lead  6. Screening for ischemic heart disease   7. Former smoker   8. Medication management  - Magnesium - TSH - Hemoglobin A1c - VITAMIN D 25 Hydroxyl - EKG 12-Lead  Follow Up Instructions:       I discussed the assessment and treatment plan with the patient. The patient was provided an opportunity to ask questions and all were answered. The patient agreed with the plan and demonstrated an understanding of the instructions.       The patient was advised to call back or seek an in-person evaluation if the symptoms worsen or if the condition fails to improve as anticipated.    Kirtland Bouchard, MD

## 2020-11-30 ENCOUNTER — Other Ambulatory Visit: Payer: Self-pay

## 2020-11-30 ENCOUNTER — Encounter: Payer: Self-pay | Admitting: Internal Medicine

## 2020-11-30 ENCOUNTER — Ambulatory Visit (INDEPENDENT_AMBULATORY_CARE_PROVIDER_SITE_OTHER): Payer: Medicare HMO | Admitting: Internal Medicine

## 2020-11-30 VITALS — BP 126/70 | HR 58 | Temp 97.0°F | Resp 16 | Ht 70.0 in | Wt 160.6 lb

## 2020-11-30 DIAGNOSIS — E782 Mixed hyperlipidemia: Secondary | ICD-10-CM | POA: Diagnosis not present

## 2020-11-30 DIAGNOSIS — Z87891 Personal history of nicotine dependence: Secondary | ICD-10-CM | POA: Diagnosis not present

## 2020-11-30 DIAGNOSIS — Z79899 Other long term (current) drug therapy: Secondary | ICD-10-CM | POA: Diagnosis not present

## 2020-11-30 DIAGNOSIS — I2510 Atherosclerotic heart disease of native coronary artery without angina pectoris: Secondary | ICD-10-CM

## 2020-11-30 DIAGNOSIS — Z136 Encounter for screening for cardiovascular disorders: Secondary | ICD-10-CM

## 2020-11-30 DIAGNOSIS — I10 Essential (primary) hypertension: Secondary | ICD-10-CM | POA: Diagnosis not present

## 2020-11-30 DIAGNOSIS — M25511 Pain in right shoulder: Secondary | ICD-10-CM | POA: Diagnosis not present

## 2020-11-30 DIAGNOSIS — E559 Vitamin D deficiency, unspecified: Secondary | ICD-10-CM

## 2020-11-30 DIAGNOSIS — R7309 Other abnormal glucose: Secondary | ICD-10-CM | POA: Diagnosis not present

## 2020-12-01 LAB — TSH: TSH: 1.48 mIU/L (ref 0.40–4.50)

## 2020-12-01 LAB — HEMOGLOBIN A1C
Hgb A1c MFr Bld: 6.4 % of total Hgb — ABNORMAL HIGH (ref <5.7)
Mean Plasma Glucose: 137 mg/dL
eAG (mmol/L): 7.6 mmol/L

## 2020-12-01 LAB — MAGNESIUM: Magnesium: 2 mg/dL (ref 1.5–2.5)

## 2020-12-01 LAB — VITAMIN D 25 HYDROXY (VIT D DEFICIENCY, FRACTURES): Vit D, 25-Hydroxy: 76 ng/mL (ref 30–100)

## 2020-12-02 NOTE — Progress Notes (Signed)
============================================================ ============================================================  -    A1c = 6.4% higher in early diabetic range   - which has 300% increased risk for heart attack, stroke, cancer and   Alzheimer- type Vascular Dementia   But the good news is that diet, exercise with  weight loss can cure the early diabetes at this point.  - if A1c continues to increase , will need to start meds for Diabetes ============================================================ ============================================================  -  Vitamin D = 76 - Excellent ============================================================ ============================================================  -  Thyroid & Magnesium - Both Normal & OK  ============================================================ ============================================================

## 2020-12-05 ENCOUNTER — Encounter: Payer: Self-pay | Admitting: *Deleted

## 2020-12-08 DIAGNOSIS — I252 Old myocardial infarction: Secondary | ICD-10-CM | POA: Diagnosis not present

## 2020-12-08 DIAGNOSIS — I272 Pulmonary hypertension, unspecified: Secondary | ICD-10-CM | POA: Diagnosis not present

## 2020-12-08 DIAGNOSIS — I2510 Atherosclerotic heart disease of native coronary artery without angina pectoris: Secondary | ICD-10-CM | POA: Diagnosis not present

## 2020-12-08 DIAGNOSIS — R0683 Snoring: Secondary | ICD-10-CM | POA: Diagnosis not present

## 2020-12-08 DIAGNOSIS — I25118 Atherosclerotic heart disease of native coronary artery with other forms of angina pectoris: Secondary | ICD-10-CM | POA: Diagnosis not present

## 2020-12-08 DIAGNOSIS — Z888 Allergy status to other drugs, medicaments and biological substances status: Secondary | ICD-10-CM | POA: Diagnosis not present

## 2020-12-08 DIAGNOSIS — Z87891 Personal history of nicotine dependence: Secondary | ICD-10-CM | POA: Diagnosis not present

## 2020-12-08 DIAGNOSIS — Z7982 Long term (current) use of aspirin: Secondary | ICD-10-CM | POA: Diagnosis not present

## 2020-12-08 DIAGNOSIS — Z7902 Long term (current) use of antithrombotics/antiplatelets: Secondary | ICD-10-CM | POA: Diagnosis not present

## 2020-12-08 DIAGNOSIS — Z955 Presence of coronary angioplasty implant and graft: Secondary | ICD-10-CM | POA: Diagnosis not present

## 2020-12-08 DIAGNOSIS — I739 Peripheral vascular disease, unspecified: Secondary | ICD-10-CM | POA: Diagnosis not present

## 2020-12-08 DIAGNOSIS — R0602 Shortness of breath: Secondary | ICD-10-CM | POA: Diagnosis not present

## 2020-12-08 DIAGNOSIS — G4733 Obstructive sleep apnea (adult) (pediatric): Secondary | ICD-10-CM | POA: Diagnosis not present

## 2020-12-08 DIAGNOSIS — I081 Rheumatic disorders of both mitral and tricuspid valves: Secondary | ICD-10-CM | POA: Diagnosis not present

## 2020-12-08 DIAGNOSIS — I10 Essential (primary) hypertension: Secondary | ICD-10-CM | POA: Diagnosis not present

## 2020-12-08 DIAGNOSIS — E785 Hyperlipidemia, unspecified: Secondary | ICD-10-CM | POA: Diagnosis not present

## 2020-12-08 DIAGNOSIS — R200 Anesthesia of skin: Secondary | ICD-10-CM | POA: Diagnosis not present

## 2020-12-08 DIAGNOSIS — Z79899 Other long term (current) drug therapy: Secondary | ICD-10-CM | POA: Diagnosis not present

## 2020-12-12 DIAGNOSIS — S46011D Strain of muscle(s) and tendon(s) of the rotator cuff of right shoulder, subsequent encounter: Secondary | ICD-10-CM | POA: Diagnosis not present

## 2021-01-22 ENCOUNTER — Other Ambulatory Visit (HOSPITAL_BASED_OUTPATIENT_CLINIC_OR_DEPARTMENT_OTHER): Payer: Self-pay

## 2021-01-22 DIAGNOSIS — R0683 Snoring: Secondary | ICD-10-CM

## 2021-01-22 DIAGNOSIS — G4710 Hypersomnia, unspecified: Secondary | ICD-10-CM

## 2021-01-22 DIAGNOSIS — R5383 Other fatigue: Secondary | ICD-10-CM

## 2021-03-01 ENCOUNTER — Ambulatory Visit (HOSPITAL_BASED_OUTPATIENT_CLINIC_OR_DEPARTMENT_OTHER): Payer: Medicare HMO | Attending: Family Medicine | Admitting: Internal Medicine

## 2021-03-01 ENCOUNTER — Other Ambulatory Visit: Payer: Self-pay

## 2021-03-01 VITALS — Ht 69.0 in | Wt 161.0 lb

## 2021-03-01 DIAGNOSIS — G4733 Obstructive sleep apnea (adult) (pediatric): Secondary | ICD-10-CM | POA: Diagnosis not present

## 2021-03-01 DIAGNOSIS — R5383 Other fatigue: Secondary | ICD-10-CM

## 2021-03-01 DIAGNOSIS — I493 Ventricular premature depolarization: Secondary | ICD-10-CM | POA: Insufficient documentation

## 2021-03-01 DIAGNOSIS — R0683 Snoring: Secondary | ICD-10-CM

## 2021-03-01 DIAGNOSIS — G4710 Hypersomnia, unspecified: Secondary | ICD-10-CM

## 2021-03-04 ENCOUNTER — Encounter: Payer: Self-pay | Admitting: Internal Medicine

## 2021-03-04 NOTE — Progress Notes (Signed)
Annual  Screening/Preventative Visit  & Comprehensive Evaluation & Examination  Future Appointments  Date Time Provider [place]  03/05/2021  3:00 PM Unk Pinto, MD [facility] None  06/06/2021  3:00 PM Liane Comber, NP [facility] None  03/06/2022 10:00 AM Unk Pinto, MD [facility] None            This very nice 70 y.o.  MBM presents for a Screening /Preventative Visit & comprehensive evaluation and management of multiple medical co-morbidities.  Patient has been followed for  HTN, ASCAD, HLD, Pre-Diabetes and Vitamin D Deficiency .       HTN predates since     . Patient's BP has been controlled at home.  Today's BP is at goal -  130/90.  In  2016 patient underwent PCA w/DES implanted.  Then in Aug 2021, he had a 2sd DES implanted.  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  Patient has hx/o Angina preceding his cardiac procedures, but none since. He also had hx/o systolic CHF which stabilized post recovery. He continues  f/u for his ASPVD.  Patient denies any cardiac symptoms as chest pain, palpitations, shortness of breath, dizziness or ankle swelling.       Patient's last lipids were controlled with diet and Atorvastatin. Patient denies myalgias or other medication SE's. Last lipids were at goal:  Lab Results  Component Value Date   CHOL 138 11/06/2020   HDL 44 11/06/2020   LDLCALC 72 11/06/2020   TRIG 139 11/06/2020   CHOLHDL 3.1 11/06/2020         Patient has hx/o prediabetes (2021) and patient denies reactive hypoglycemic symptoms, visual blurring, diabetic polys or paresthesias. Last A1c was not at goal:   Lab Results  Component Value Date   HGBA1C 6.4 (H) 11/30/2020          Finally, patient has history of Vitamin D Deficiency and last vitamin D was at goal:   Lab Results  Component Value Date   VD25OH 76 11/30/2020     Current Outpatient Medications on File Prior to Visit  Medication  Sig  . ascorbic acid (VITAMIN C) 1000 MG tablet Take 1 tablet by mouth daily.  Marland Kitchen aspirin EC 81 MG tablet Take 1 tablet (81 mg total) by mouth daily. Swallow whole.  Marland Kitchen atorvastatin (LIPITOR) 80 MG tablet Take 1 tablet Daily for Cholesterol  . Bioflavonoid Products (VITAMIN C PLUS) 1000 MG TABS Take 1 tablet Daily  . Cholecalciferol (VITAMIN D-3) 125 MCG (5000 UT) TABS Take 1 tablet by mouth daily.  . cilostazol (PLETAL) 100 MG tablet Take    1 tablet     2 x /day      to Prevent Blood Clots  . clopidogrel (PLAVIX) 75 MG tablet Take   1 tablet   Daily     to Prevent Blood Clots  . ezetimibe (ZETIA) 10 MG tablet Take 10 mg by mouth daily.  Marland Kitchen lisinopril-hydrochlorothiazide (ZESTORETIC) 20-12.5 MG tablet Take    1 tablet     Daily      for BP & Heart  . metoprolol succinate (TOPROL-XL) 25 MG 24 hr tablet Take 1 tablet (25 mg total) by mouth daily.  . montelukast (SINGULAIR) 10 MG tablet Take      1 tablet      Daily      for Allergies  . Multiple Vitamins-Minerals (MULTIVITAMIN WITH MINERALS) tablet Take 1 tablet Daily (Patient  taking differently: Take 1 tablet by mouth daily.)  . nitroGLYCERIN (NITROSTAT) 0.4 MG SL tablet Place 1 tablet (0.4 mg total) under the tongue every 5 (five) minutes as needed for chest pain.      Allergies  Allergen Reactions  . Coreg [Carvedilol] Hives and Itching     Past Medical History:  Diagnosis Date  . Hyperlipidemia   . Hypertension 2010  . Myocardial infarction ([place]) 2015  . Prediabetes 2015     Health Maintenance  Topic Date Due  . Pneumococcal Vaccine 33-62 Years old (1 of 4 - PCV13) Never done  . COLONOSCOPY Never done  . Zoster Vaccines- Shingrix (1 of 2) Never done  . INFLUENZA VACCINE  04/30/2021  . TETANUS/TDAP  09/30/2024  . COVID-19 Vaccine  Completed  . Hepatitis C Screening  Completed  . HPV VACCINES  Aged Out  . PNA vac Low Risk Adult  Discontinued     Immunization History  Administered Date(s) Administered  . PFIZER SARS-COV-2  Vaccination 11/04/2019, 11/25/2019, 07/09/2020  . Tetanus 09/30/2014    Last Colon - [place] he had a Colonoscopy 3-4 years ago.    Past Surgical History:  Procedure Laterality Date  . CORONARY STENT INTERVENTION N/A 05/05/2020   Procedure: CORONARY STENT INTERVENTION;  Surgeon: Troy Sine, MD;  Location: [facility];  Service: Cardiovascular;  Laterality: N/A;  . CORONARY/GRAFT ACUTE MI REVASCULARIZATION N/A 05/05/2020   Procedure: Coronary/Graft Acute MI Revascularization;  Surgeon: Troy Sine, MD;  Location: [facility];  Service: Cardiovascular;  Laterality: N/A;  . LEFT HEART CATH AND CORONARY ANGIOGRAPHY N/A 05/05/2020   Procedure: LEFT HEART CATH AND CORONARY ANGIOGRAPHY;  Surgeon: Troy Sine, MD;  Location: [facility];  Service: Cardiovascular;  Laterality: N/A;     Family History  Problem Relation Age of Onset  . Cancer Mother   . Hypertension Father   . Heart disease Father   . Diabetes Sister   . Stroke Brother   . COPD Brother   . Hypertension Brother   . COPD Brother     Social History   Socioeconomic History  . Marital status: Married    Spouse name: Judson Roch  . Number of children: 1  Occupational History  . Retired  Tobacco Use  . Smoking status: Former Research scientist (life sciences)  . Smokeless tobacco: Never Used  Vaping Use  . Vaping Use: Never used  Substance and Sexual Activity  . Alcohol use: Not Currently  . Drug use: Not on file  . Sexual activity: Not on file     ROS Constitutional: Denies fever, chills, weight loss/gain, headaches, insomnia,  night sweats or change in appetite. Does c/o fatigue. Eyes: Denies redness, blurred vision, diplopia, discharge, itchy or watery eyes.  ENT: Denies discharge, congestion, post nasal drip, epistaxis, sore throat, earache, hearing loss, dental pain, Tinnitus, Vertigo, Sinus pain or snoring.  Cardio: Denies chest pain, palpitations, irregular heartbeat, syncope, dyspnea, diaphoresis, orthopnea, PND,  claudication or edema Respiratory: denies cough, dyspnea, DOE, pleurisy, hoarseness, laryngitis or wheezing.  Gastrointestinal: Denies dysphagia, heartburn, reflux, water brash, pain, cramps, nausea, vomiting, bloating, diarrhea, constipation, hematemesis, melena, hematochezia, jaundice or hemorrhoids Genitourinary: Denies dysuria, frequency, urgency, nocturia, hesitancy, discharge, hematuria or flank pain Musculoskeletal: Denies arthralgia, myalgia, stiffness, Jt. Swelling, pain, limp or strain/sprain. Denies Falls. Skin: Denies puritis, rash, hives, warts, acne, eczema or change in skin lesion Neuro: No weakness, tremor, incoordination, spasms, paresthesia or pain Psychiatric: Denies confusion, memory loss or sensory loss. Denies Depression.  Endocrine: Denies change in weight, skin, hair change, nocturia, and paresthesia, diabetic polys, visual blurring or hyper / hypo glycemic episodes.  Heme/Lymph: No excessive bleeding, bruising or enlarged lymph nodes.   Physical Exam  BP 130/90 (BP Location: Right Arm, Patient Position: Sitting, Cuff Size: Normal)   Pulse 75   Temp 98.1 F (36.7 C)   Resp 17   Ht 5\' 10"  (1.778 m)   Wt 161 lb 3.2 oz (73.1 kg)   SpO2 99%   BMI 23.13 kg/m   General Appearance: Well nourished and well groomed and in no apparent distress.  Eyes: PERRLA, EOMs, conjunctiva no swelling or erythema, normal fundi and vessels. Sinuses: No frontal/maxillary tenderness ENT/Mouth: EACs patent / TMs  nl. Nares clear without erythema, swelling, mucoid exudates. Oral hygiene is good. No erythema, swelling, or exudate. Tongue normal, non-obstructing. Tonsils not swollen or erythematous. Hearing normal.  Neck: Supple, thyroid not palpable. No bruits, nodes or JVD. Respiratory: Respiratory effort normal.  BS equal and clear bilateral without rales, rhonci, wheezing or stridor. Cardio: Heart sounds are normal with regular rate and rhythm and no murmurs, rubs or gallops. Peripheral  pulses are normal and equal bilaterally without edema. No aortic or femoral bruits. Chest: symmetric with normal excursions and percussion.  Abdomen: Soft, with Nl bowel sounds. Nontender, no guarding, rebound, hernias, masses, or organomegaly.  Lymphatics: Non tender without lymphadenopathy.  Musculoskeletal: Full ROM all peripheral extremities, joint stability, 5/5 strength, and normal gait. Skin: Warm and dry without rashes, lesions, cyanosis, clubbing or  ecchymosis.  Neuro: Cranial nerves intact, reflexes equal bilaterally. Normal muscle tone, no cerebellar symptoms. Sensation intact.  Pysch: Alert and oriented X 3 with normal affect, insight and judgment appropriate.   Assessment and Plan  1. Annual Preventative/Screening Exam    2. Essential hypertension  - EKG 12-Lead - [place], RETROPERITNL ABD,  LTD - CBC with Differential/Platelet - COMPLETE METABOLIC PANEL WITH GFR - Magnesium - TSH  3. Hyperlipidemia, mixed  - EKG 12-Lead - [place], RETROPERITNL ABD,  LTD - Lipid panel - TSH  4. Abnormal glucose  - EKG 12-Lead - [place], RETROPERITNL ABD,  LTD - Hemoglobin A1c - Insulin, random  5. Vitamin D deficiency  - VITAMIN D 25 Hydroxy  6. Coronary artery disease involving native coronary artery  without angina pectoris  - EKG 12-Lead - Lipid panel  7. PAD (peripheral artery disease) (HCC)  - EKG 12-Lead - [place], RETROPERITNL ABD,  LTD - Lipid panel  8. BPH with obstruction/lower urinary tract symptoms  - PSA  9. Stage 3a chronic kidney disease (HCC)  - Urinalysis, Routine w reflex microscopic - Microalbumin / creatinine urine ratio - PTH, intact and calcium - COMPLETE METABOLIC PANEL WITH GFR  10. Prediabetes  - Hemoglobin A1c - Insulin, random  11. Prostate cancer screening  - PSA  12. Screening for colorectal cancer  - POC Hemoccult Bld/Stl   13. Screening for ischemic heart disease  - EKG 12-Lead  14. FHx: heart disease  - EKG 12-Lead - [place],  RETROPERITNL ABD,  LTD  15. Former smoker  - EKG 12-Lead - [place], RETROPERITNL ABD,  LTD  16. Screening for AAA (aortic abdominal aneurysm)  - [place], RETROPERITNL ABD,  LTD  17. Medication management  - Urinalysis, Routine w reflex microscopic - Microalbumin / creatinine urine ratio - CBC with Differential/Platelet - COMPLETE METABOLIC PANEL WITH GFR - Magnesium - Lipid panel - TSH - Hemoglobin A1c - Insulin, random - VITAMIN D 25 Hydroxy  Patient was counseled in prudent diet, weight control to achieve/maintain BMI less than 25, BP monitoring, regular exercise and medications as discussed.  Discussed med effects and SE's. Routine screening labs and tests as requested with regular follow-up as recommended. Over 40 minutes of exam, counseling, chart review and high complex critical decision making was performed   Kirtland Bouchard, MD

## 2021-03-04 NOTE — Patient Instructions (Signed)

## 2021-03-05 ENCOUNTER — Other Ambulatory Visit: Payer: Self-pay

## 2021-03-05 ENCOUNTER — Ambulatory Visit (INDEPENDENT_AMBULATORY_CARE_PROVIDER_SITE_OTHER): Payer: Medicare HMO | Admitting: Internal Medicine

## 2021-03-05 VITALS — BP 130/90 | HR 75 | Temp 98.1°F | Resp 17 | Ht 70.0 in | Wt 161.2 lb

## 2021-03-05 DIAGNOSIS — Z87891 Personal history of nicotine dependence: Secondary | ICD-10-CM

## 2021-03-05 DIAGNOSIS — Z8249 Family history of ischemic heart disease and other diseases of the circulatory system: Secondary | ICD-10-CM

## 2021-03-05 DIAGNOSIS — I10 Essential (primary) hypertension: Secondary | ICD-10-CM | POA: Diagnosis not present

## 2021-03-05 DIAGNOSIS — E559 Vitamin D deficiency, unspecified: Secondary | ICD-10-CM | POA: Diagnosis not present

## 2021-03-05 DIAGNOSIS — E782 Mixed hyperlipidemia: Secondary | ICD-10-CM | POA: Diagnosis not present

## 2021-03-05 DIAGNOSIS — I739 Peripheral vascular disease, unspecified: Secondary | ICD-10-CM | POA: Diagnosis not present

## 2021-03-05 DIAGNOSIS — N138 Other obstructive and reflux uropathy: Secondary | ICD-10-CM

## 2021-03-05 DIAGNOSIS — N1831 Chronic kidney disease, stage 3a: Secondary | ICD-10-CM | POA: Diagnosis not present

## 2021-03-05 DIAGNOSIS — I5020 Unspecified systolic (congestive) heart failure: Secondary | ICD-10-CM

## 2021-03-05 DIAGNOSIS — Z0001 Encounter for general adult medical examination with abnormal findings: Secondary | ICD-10-CM

## 2021-03-05 DIAGNOSIS — Z0000 Encounter for general adult medical examination without abnormal findings: Secondary | ICD-10-CM

## 2021-03-05 DIAGNOSIS — Z136 Encounter for screening for cardiovascular disorders: Secondary | ICD-10-CM

## 2021-03-05 DIAGNOSIS — Z125 Encounter for screening for malignant neoplasm of prostate: Secondary | ICD-10-CM

## 2021-03-05 DIAGNOSIS — I2510 Atherosclerotic heart disease of native coronary artery without angina pectoris: Secondary | ICD-10-CM | POA: Diagnosis not present

## 2021-03-05 DIAGNOSIS — R7303 Prediabetes: Secondary | ICD-10-CM | POA: Diagnosis not present

## 2021-03-05 DIAGNOSIS — R7309 Other abnormal glucose: Secondary | ICD-10-CM | POA: Diagnosis not present

## 2021-03-05 DIAGNOSIS — Z79899 Other long term (current) drug therapy: Secondary | ICD-10-CM | POA: Diagnosis not present

## 2021-03-05 DIAGNOSIS — N401 Enlarged prostate with lower urinary tract symptoms: Secondary | ICD-10-CM | POA: Diagnosis not present

## 2021-03-05 DIAGNOSIS — Z1212 Encounter for screening for malignant neoplasm of rectum: Secondary | ICD-10-CM

## 2021-03-05 DIAGNOSIS — Z1211 Encounter for screening for malignant neoplasm of colon: Secondary | ICD-10-CM

## 2021-03-06 ENCOUNTER — Other Ambulatory Visit: Payer: Self-pay | Admitting: Internal Medicine

## 2021-03-06 DIAGNOSIS — D529 Folate deficiency anemia, unspecified: Secondary | ICD-10-CM

## 2021-03-06 DIAGNOSIS — D519 Vitamin B12 deficiency anemia, unspecified: Secondary | ICD-10-CM

## 2021-03-06 DIAGNOSIS — D649 Anemia, unspecified: Secondary | ICD-10-CM

## 2021-03-06 DIAGNOSIS — D509 Iron deficiency anemia, unspecified: Secondary | ICD-10-CM

## 2021-03-06 LAB — COMPLETE METABOLIC PANEL WITH GFR
AG Ratio: 2 (calc) (ref 1.0–2.5)
ALT: 19 U/L (ref 9–46)
AST: 20 U/L (ref 10–35)
Albumin: 4.2 g/dL (ref 3.6–5.1)
Alkaline phosphatase (APISO): 66 U/L (ref 35–144)
BUN/Creatinine Ratio: 17 (calc) (ref 6–22)
BUN: 21 mg/dL (ref 7–25)
CO2: 28 mmol/L (ref 20–32)
Calcium: 9.4 mg/dL (ref 8.6–10.3)
Chloride: 102 mmol/L (ref 98–110)
Creat: 1.27 mg/dL — ABNORMAL HIGH (ref 0.70–1.25)
GFR, Est African American: 67 mL/min/{1.73_m2} (ref >=60)
GFR, Est Non African American: 58 mL/min/{1.73_m2} — ABNORMAL LOW (ref >=60)
Globulin: 2.1 g/dL (calc) (ref 1.9–3.7)
Glucose, Bld: 86 mg/dL (ref 65–99)
Potassium: 4.1 mmol/L (ref 3.5–5.3)
Sodium: 136 mmol/L (ref 135–146)
Total Bilirubin: 0.4 mg/dL (ref 0.2–1.2)
Total Protein: 6.3 g/dL (ref 6.1–8.1)

## 2021-03-06 LAB — CBC WITH DIFFERENTIAL/PLATELET
Absolute Monocytes: 554 cells/uL (ref 200–950)
Basophils Absolute: 51 cells/uL (ref 0–200)
Basophils Relative: 1.3 %
Eosinophils Absolute: 230 cells/uL (ref 15–500)
Eosinophils Relative: 5.9 %
HCT: 33.3 % — ABNORMAL LOW (ref 38.5–50.0)
Hemoglobin: 10.6 g/dL — ABNORMAL LOW (ref 13.2–17.1)
Lymphs Abs: 1755 cells/uL (ref 850–3900)
MCH: 28.1 pg (ref 27.0–33.0)
MCHC: 31.8 g/dL — ABNORMAL LOW (ref 32.0–36.0)
MCV: 88.3 fL (ref 80.0–100.0)
MPV: 10.5 fL (ref 7.5–12.5)
Monocytes Relative: 14.2 %
Neutro Abs: 1310 cells/uL — ABNORMAL LOW (ref 1500–7800)
Neutrophils Relative %: 33.6 %
Platelets: 241 10*3/uL (ref 140–400)
RBC: 3.77 10*6/uL — ABNORMAL LOW (ref 4.20–5.80)
RDW: 13.1 % (ref 11.0–15.0)
Total Lymphocyte: 45 %
WBC: 3.9 10*3/uL (ref 3.8–10.8)

## 2021-03-06 LAB — URINALYSIS, ROUTINE W REFLEX MICROSCOPIC
Bilirubin Urine: NEGATIVE
Glucose, UA: NEGATIVE
Hgb urine dipstick: NEGATIVE
Ketones, ur: NEGATIVE
Leukocytes,Ua: NEGATIVE
Nitrite: NEGATIVE
Protein, ur: NEGATIVE
Specific Gravity, Urine: 1.014 (ref 1.001–1.035)
pH: 5.5 (ref 5.0–8.0)

## 2021-03-06 LAB — VITAMIN D 25 HYDROXY (VIT D DEFICIENCY, FRACTURES): Vit D, 25-Hydroxy: 80 ng/mL (ref 30–100)

## 2021-03-06 LAB — TSH: TSH: 1.65 mIU/L (ref 0.40–4.50)

## 2021-03-06 LAB — PTH, INTACT AND CALCIUM
Calcium: 9.4 mg/dL (ref 8.6–10.3)
PTH: 28 pg/mL (ref 16–77)

## 2021-03-06 LAB — LIPID PANEL
Cholesterol: 121 mg/dL (ref <200)
HDL: 47 mg/dL (ref >=40)
LDL Cholesterol (Calc): 55 mg/dL (calc)
Non-HDL Cholesterol (Calc): 74 mg/dL (calc) (ref <130)
Total CHOL/HDL Ratio: 2.6 (calc) (ref <5.0)
Triglycerides: 100 mg/dL (ref <150)

## 2021-03-06 LAB — MICROALBUMIN / CREATININE URINE RATIO
Creatinine, Urine: 121 mg/dL (ref 20–320)
Microalb Creat Ratio: 3 mcg/mg creat (ref <30)
Microalb, Ur: 0.4 mg/dL

## 2021-03-06 LAB — INSULIN, RANDOM: Insulin: 5.9 u[IU]/mL

## 2021-03-06 LAB — HEMOGLOBIN A1C
Hgb A1c MFr Bld: 6.1 % of total Hgb — ABNORMAL HIGH (ref <5.7)
Mean Plasma Glucose: 128 mg/dL
eAG (mmol/L): 7.1 mmol/L

## 2021-03-06 LAB — MAGNESIUM: Magnesium: 1.7 mg/dL (ref 1.5–2.5)

## 2021-03-06 LAB — PSA: PSA: 0.8 ng/mL (ref <=4.00)

## 2021-03-06 NOTE — Progress Notes (Signed)
============================================================ - Test results slightly outside the reference range are not unusual. If there is anything important, I will review this with you,  otherwise it is considered normal test values.  If you have further questions,  please do not hesitate to contact me at the office or via My Chart.  ============================================================ ============================================================  -  PSA is very Low - Great ! ============================================================ ============================================================  -  PTH is a hormone that regulates calcium balance in the body & is Normal  ============================================================ ============================================================  -  CBC show Hgb / Red Cell count is Low                             - Need to do further tests to determine why anemic                                                                                - Please schedule a Lab visit  ============================================================ ============================================================  -   Magnesium  =  1.7     -  very  low     - goal is betw 2.0 - 2.5,   - So..............Marland Kitchen  Recommend that you take Magnesium 500 mg tablet daily   - also important to eat lots of  leafy green vegetables   - spinach - Kale - collards - greens - okra - asparagus  - broccoli - quinoa - squash - almonds   - black, red, white beans  -  peas - green beans ============================================================ ============================================================  -  Chol = 121     and      LDL Chol = 55 - Both    Excellent   - Very low risk for Heart Attack  / Stroke ============================================================ ============================================================  -  A1c = 6.1% - Still in early  Diabetic range                                          - Need to work on diet Better &                                                                                 lose a few more pounds ============================================================ ============================================================  -  Vitamin D = 80 - Excellent   - Vitamin D goal is between 70-100.    - It is very important as a natural anti-inflammatory and helping the  immune system protect against viral infections, like the Covid-19   helping hair, skin, and nails, as well as reducing stroke and  heart attack risk.   - It helps your bones and helps with mood.  - It also decreases numerous cancer risks so please                                                                                          take it as directed.   - Low Vit D is associated with a 200-300% higher risk for CANCER   and 200-300% higher risk for HEART   ATTACK  &  STROKE.     - It is also associated with higher death rate at younger ages,   autoimmune diseases like Rheumatoid arthritis, Lupus,  Multiple Sclerosis.     - Also many other serious conditions, like depression, Alzheimer's  Dementia, infertility, muscle aches, fatigue, fibromyalgia   - just to name a few. ============================================================ ============================================================  - All Else - Kidneys - Electrolytes - Liver - Magnesium & Thyroid    - all  Normal / OK ============================================================ ============================================================  -

## 2021-03-10 DIAGNOSIS — R0683 Snoring: Secondary | ICD-10-CM

## 2021-03-10 NOTE — Procedures (Signed)
    Patient Name: Patrick Tucker, Patrick Tucker Date: 03/01/2021 Gender: Male D.O.B: Jun 17, 1951 Age (years): 36 Referring Provider: Christianne Dolin FNP Height (inches): 3 Interpreting Physician: Baird Lyons MD, ABSM Weight (lbs): 161 RPSGT: Zadie Rhine BMI: 24 MRN: [id number] Neck Size: 14.00  CLINICAL INFORMATION Sleep Study Type: NPSG Indication for sleep study: Fatigue, Snoring Epworth Sleepiness Score: 3  SLEEP STUDY TECHNIQUE As per the AASM Manual for the Scoring of Sleep and Associated Events v2.3 (April 2016) with a hypopnea requiring 4% desaturations.  The channels recorded and monitored were frontal, central and occipital EEG, electrooculogram (EOG), submentalis EMG (chin), nasal and oral airflow, thoracic and abdominal wall motion, anterior tibialis EMG, snore microphone, electrocardiogram, and pulse oximetry.  MEDICATIONS Medications self-administered by patient taken the night of the study : none reported  SLEEP ARCHITECTURE The study was initiated at 9:49:13 PM and ended at 4:27:26 AM.  Sleep onset time was 20.2 minutes and the sleep efficiency was 75.5%%. The total sleep time was 300.5 minutes.  Stage REM latency was 144.0 minutes.  The patient spent 9.8%% of the night in stage N1 sleep, 59.9%% in stage N2 sleep, 0.2%% in stage N3 and 30.1% in REM.  Alpha intrusion was absent.  Supine sleep was 48.08%.  RESPIRATORY PARAMETERS The overall apnea/hypopnea index (AHI) was 5.6 per hour. There were 28 total apneas, including 18 obstructive, 9 central and 1 mixed apneas. There were 0 hypopneas and 39 RERAs.  The AHI during Stage REM sleep was 17.9 per hour.  AHI while supine was 11.6 per hour.  The mean oxygen saturation was 97.4%. The minimum SpO2 during sleep was 92.0%.  moderate snoring was noted during this study.  CARDIAC DATA The 2 lead EKG demonstrated sinus rhythm. The mean heart rate was 67.0 beats per minute. Other EKG findings include: PVCs.  LEG  MOVEMENT DATA The total PLMS were 0 with a resulting PLMS index of 0.0. Associated arousal with leg movement index was 0.0 .  IMPRESSIONS - Mild obstructive sleep apnea occurred during this study (AHI = 5.6/h). - Insufficient events to meet protocol requirement for split CPAP titration. - No significant central sleep apnea occurred during this study (CAI = 1.8/h). - The patient had minimal or no oxygen desaturation during the study (Min O2 = 92.0%) - The patient snored with moderate snoring volume. - EKG findings include PVCs. - Clinically significant periodic limb movements did not occur during sleep. No significant associated arousals  DIAGNOSIS - Obstructive Sleep Apnea (G47.33)  RECOMMENDATIONS - Treatment for mild OSA is directed at symptoms. Conservative measures may include observation, weight loss and sleep position off back. Other options, including management consultation, CPAP, a fitted oral appliance or ENT evaluation, would be based on clinical judgment. - Be careful with alcohol, sedatives and other CNS depressants that may worsen sleep apnea and disrupt normal sleep architecture. - Sleep hygiene should be reviewed to assess factors that may improve sleep quality. - Weight management and regular exercise should be initiated or continued if appropriate.  [Electronically signed] 03/10/2021 11:39 AM  Baird Lyons MD, ABSM Diplomate, American Board of Sleep Medicine   NPI: [id number]                         [place]  ELECTRONICALLY SIGNED ON:  03/10/2021, 11:35 AM [place] PH: [phone]   FX: [phone] [place]

## 2021-03-28 ENCOUNTER — Other Ambulatory Visit: Payer: Self-pay

## 2021-03-28 ENCOUNTER — Other Ambulatory Visit: Payer: Self-pay | Admitting: Internal Medicine

## 2021-03-28 ENCOUNTER — Ambulatory Visit (INDEPENDENT_AMBULATORY_CARE_PROVIDER_SITE_OTHER): Payer: Medicare HMO | Admitting: *Deleted

## 2021-03-28 VITALS — BP 108/68 | HR 82 | Temp 97.3°F | Resp 16 | Wt 161.2 lb

## 2021-03-28 DIAGNOSIS — E782 Mixed hyperlipidemia: Secondary | ICD-10-CM

## 2021-03-28 DIAGNOSIS — Z79899 Other long term (current) drug therapy: Secondary | ICD-10-CM | POA: Diagnosis not present

## 2021-03-28 DIAGNOSIS — I10 Essential (primary) hypertension: Secondary | ICD-10-CM

## 2021-03-28 DIAGNOSIS — R252 Cramp and spasm: Secondary | ICD-10-CM

## 2021-03-28 LAB — COMPLETE METABOLIC PANEL WITH GFR
AG Ratio: 1.7 (calc) (ref 1.0–2.5)
ALT: 18 U/L (ref 9–46)
AST: 22 U/L (ref 10–35)
Albumin: 3.8 g/dL (ref 3.6–5.1)
Alkaline phosphatase (APISO): 73 U/L (ref 35–144)
BUN/Creatinine Ratio: 22 (calc) (ref 6–22)
BUN: 36 mg/dL — ABNORMAL HIGH (ref 7–25)
CO2: 26 mmol/L (ref 20–32)
Calcium: 9 mg/dL (ref 8.6–10.3)
Chloride: 100 mmol/L (ref 98–110)
Creat: 1.66 mg/dL — ABNORMAL HIGH (ref 0.70–1.25)
GFR, Est African American: 49 mL/min/{1.73_m2} — ABNORMAL LOW (ref >=60)
GFR, Est Non African American: 42 mL/min/{1.73_m2} — ABNORMAL LOW (ref >=60)
Globulin: 2.2 g/dL (calc) (ref 1.9–3.7)
Glucose, Bld: 109 mg/dL — ABNORMAL HIGH (ref 65–99)
Potassium: 4.2 mmol/L (ref 3.5–5.3)
Sodium: 133 mmol/L — ABNORMAL LOW (ref 135–146)
Total Bilirubin: 0.3 mg/dL (ref 0.2–1.2)
Total Protein: 6 g/dL — ABNORMAL LOW (ref 6.1–8.1)

## 2021-03-28 LAB — CK: Total CK: 339 U/L — ABNORMAL HIGH (ref 44–196)

## 2021-03-28 LAB — MAGNESIUM: Magnesium: 1.7 mg/dL (ref 1.5–2.5)

## 2021-03-28 NOTE — Progress Notes (Signed)
Patient here for NV and labs due to muscle cramps. Patient states he has cramping all over his body. Patient takes Atorvastatin 80 mg 1/2 tablet daily.

## 2021-03-29 NOTE — Progress Notes (Signed)
============================================================ - Test results slightly outside the reference range are not unusual. If there is anything important, I will review this with you,  otherwise it is considered normal test values.  If you have further questions,  please do not hesitate to contact me at the office or via My Chart.  ============================================================ ============================================================  -  Kidney functions still Stage 3b   -  and Kidney functions still look a little dehydrated    Very important to drink adequate amounts of fluids to                                                                                   prevent permanent damage     - Recommend drink at least 6 bottles (16 ounces) of                                                                       fluids /water /day = 96 Oz ~100 oz   - 100 oz = 3,000 cc or 3 liters / day  - >>                                                   That's 1 &1/2 bottles of a 2 liter soda bottle /day !  ============================================================ ============================================================  -   Magnesium  -   1.7  -  very  low- goal is betw 2.0 - 2.5,   - So..............Marland Kitchen  Recommend that you take                                                   Magnesium 500 mg tablet  x 2 tablets  /daily   - also important to eat lots of  leafy green vegetables   - spinach - Kale - collards - greens - okra - asparagus  - broccoli - quinoa - squash - almonds   - black, red, white beans  -  peas - green beans ============================================================ ============================================================  -  CPK Muscle is elevated  & this could possibly                                                              be related to your Atorvastatin medicine   - So stop the Atorvastatin       and        Please continue  the Zetia  (Ezetimibe)  ============================================================ ============================================================  -  Diet to lower Cholesterol; is even more important with                                                                             you stopping the Atorvastatin !   - Cholesterol only comes from animal sources  - ie. meat, dairy, egg yolks  - Eat all the vegetables you want.  - Avoid meat, especially red meat - Beef AND Pork .  - Avoid cheese & dairy - milk & ice cream.     - Cheese is the most concentrated form of trans-fats which  is the worst thing to clog up our arteries.    - Veggie cheese is OK which can be found in the fresh  produce section at [facility] or [employer] or [employer] ============================================================ ============================================================

## 2021-04-03 NOTE — Progress Notes (Signed)
Pt was called to discuss his lab results. Pt did not answer the phone so a VM was left for him to give the office a  call back.04/03/21 DG/CCMA

## 2021-04-04 ENCOUNTER — Telehealth: Payer: Self-pay | Admitting: Internal Medicine

## 2021-04-04 ENCOUNTER — Telehealth: Payer: Self-pay

## 2021-04-04 NOTE — Progress Notes (Signed)
04/05/21- 69 yoM former smoker for sleep evaluation courtesy of [facility] with concern of sleep apnea and daytime drowsiness. Medical problem list includes   CAD/ MI, HTN, PAD, CHF, Hyperlipidemia, Vit D deficiency NPSG 03/01/21- AHi 5.6/ hr, desaturation to 92%, body weight 161 lbs Epworth score-6 Body weight today-159 lbs Covid vax-3 Phizer -----Sleep Consult-daytime sleepiness, snoring Wife tells him he snores loudly with witnessed apneas and he notes persistent daytime fatigue over the past 10 years. Does not fall asleep in day, but nods off with TV, drags. No sleep meds, 1 cup coffee. Sister with OSA. Denies complex parasomnias. Nocturia x1, then back to sleep.  No ENT surgery of known lung disease, but + CAD/ MI.   Prior to Admission medications   Medication Sig Start Date End Date Taking? Authorizing Provider  ascorbic acid (VITAMIN C) 1000 MG tablet Take 1 tablet by mouth daily. 02/17/15  Yes [provider]  aspirin EC 81 MG tablet Take 1 tablet (81 mg total) by mouth daily. Swallow whole. 05/07/20 05/07/21 Yes Duke, Tami Lin, PA  atorvastatin (LIPITOR) 80 MG tablet Take 1 tablet Daily for Cholesterol 10/10/19  Yes Unk Pinto, MD  Bioflavonoid Products (VITAMIN C PLUS) 1000 MG TABS Take 1 tablet Daily 10/10/19  Yes Unk Pinto, MD  Cholecalciferol (VITAMIN D-3) 125 MCG (5000 UT) TABS Take 1 tablet by mouth daily.   Yes [provider]  cilostazol (PLETAL) 100 MG tablet Take    1 tablet     2 x /day      to Prevent Blood Clots 08/01/20  Yes Unk Pinto, MD  clopidogrel (PLAVIX) 75 MG tablet Take   1 tablet   Daily     to Prevent Blood Clots 08/01/20  Yes Unk Pinto, MD  ezetimibe (ZETIA) 10 MG tablet Take 10 mg by mouth daily. 03/10/20  Yes [provider]  lisinopril-hydrochlorothiazide (ZESTORETIC) 20-12.5 MG tablet Take    1 tablet     Daily      for BP & Heart 08/01/20  Yes Unk Pinto, MD  Multiple Vitamins-Minerals  (MULTIVITAMIN WITH MINERALS) tablet Take 1 tablet Daily Patient taking differently: Take 1 tablet by mouth daily. 10/10/19  Yes Unk Pinto, MD  nitroGLYCERIN (NITROSTAT) 0.4 MG SL tablet Place 1 tablet (0.4 mg total) under the tongue every 5 (five) minutes as needed for chest pain. 05/07/20 05/07/21 Yes Duke, Tami Lin, PA  metoprolol succinate (TOPROL-XL) 25 MG 24 hr tablet Take 1 tablet (25 mg total) by mouth daily. Patient not taking: Reported on 04/05/2021 05/08/20   Ledora Bottcher, PA  montelukast (SINGULAIR) 10 MG tablet Take      1 tablet      Daily      for Allergies Patient not taking: Reported on 04/05/2021 08/28/20   Unk Pinto, MD   Past Medical History:  Diagnosis Date   Hyperlipidemia    Hypertension 2010   Myocardial infarction [facility]) 2015   Prediabetes 2015   Past Surgical History:  Procedure Laterality Date   CORONARY STENT INTERVENTION N/A 05/05/2020   Procedure: CORONARY STENT INTERVENTION;  Surgeon: Troy Sine, MD;  Location: [place] [facility];  Service: Cardiovascular;  Laterality: N/A;   CORONARY/GRAFT ACUTE MI REVASCULARIZATION N/A 05/05/2020   Procedure: Coronary/Graft Acute MI Revascularization;  Surgeon: Troy Sine, MD;  Location: [place] [facility];  Service: Cardiovascular;  Laterality: N/A;   LEFT HEART CATH AND CORONARY ANGIOGRAPHY N/A 05/05/2020   Procedure: LEFT HEART CATH AND  CORONARY ANGIOGRAPHY;  Surgeon: Troy Sine, MD;  Location: [place] [facility];  Service: Cardiovascular;  Laterality: N/A;   Family History  Problem Relation Age of Onset   Cancer Mother    Hypertension Father    Heart disease Father    Diabetes Sister    Stroke Brother    COPD Brother    Hypertension Brother    COPD Brother    Social History   Socioeconomic History   Marital status: Married    Spouse name: sarah   Number of children: 1   Years of education: Not on file   Highest education level: Not on file  Occupational History   Not on file   Tobacco Use   Smoking status: Former    Pack years: 0.00   Smokeless tobacco: Never  Vaping Use   Vaping Use: Never used  Substance and Sexual Activity   Alcohol use: Not Currently   Drug use: Not on file   Sexual activity: Not on file  Other Topics Concern   Not on file  Social History Narrative   Not on file   Social Determinants of Health   Financial Resource Strain: Not on file  Food Insecurity: Not on file  Transportation Needs: Not on file  Physical Activity: Not on file  Stress: Not on file  Social Connections: Not on file  Intimate Partner Violence: Not on file   ROS-see HPI   + = positive Constitutional:    weight loss, night sweats, fevers, chills, +fatigue, lassitude. HEENT:    headaches, difficulty swallowing, tooth/dental problems, sore throat,       sneezing, itching, ear ache, nasal congestion, post nasal drip, snoring CV:    chest pain, orthopnea, PND, swelling in lower extremities, anasarca,                                  dizziness, palpitations Resp:   shortness of breath with exertion or at rest.                productive cough,   non-productive cough, coughing up of blood.              change in color of mucus.  wheezing.   Skin:    rash or lesions. GI:  No-   heartburn, indigestion, abdominal pain, nausea, vomiting, diarrhea,                 change in bowel habits, loss of appetite GU: dysuria, change in color of urine, no urgency or frequency.   flank pain. MS:   joint pain, stiffness, decreased range of motion, back pain. Neuro-     nothing unusual Psych:  change in mood or affect.  depression or anxiety.   memory loss.  OBJ- Physical Exam General- Alert, Oriented, Affect-appropriate, Distress- none acute, slender Skin- rash-none, lesions- none, excoriation- none Lymphadenopathy- none Head- atraumatic            Eyes- Gross vision intact, PERRLA, conjunctivae and secretions clear            Ears- Hearing, canals-normal            Nose- Clear,  no-Septal dev, mucus, polyps, erosion, perforation             Throat- Mallampati III-IV , mucosa clear , drainage- none, tonsils- atrophic, +teeth Neck- flexible , trachea midline, no stridor , thyroid nl, carotid no bruit  Chest - symmetrical excursion , unlabored           Heart/CV- RRR , no murmur , no gallop  , no rub, nl s1 s2                           - JVD- none , edema- none, stasis changes- none, varices- none           Lung- clear to P&A, wheeze- none, cough- none , dullness-none, rub- none           Chest wall-  Abd-  Br/ Gen/ Rectal- Not done, not indicated Extrem- cyanosis- none, clubbing, none, atrophy- none, strength- nl Neuro- grossly intact to observation

## 2021-04-04 NOTE — Telephone Encounter (Signed)
returned call for lab results

## 2021-04-05 ENCOUNTER — Encounter: Payer: Self-pay | Admitting: Internal Medicine

## 2021-04-05 ENCOUNTER — Ambulatory Visit (INDEPENDENT_AMBULATORY_CARE_PROVIDER_SITE_OTHER): Payer: Medicare HMO | Admitting: Internal Medicine

## 2021-04-05 ENCOUNTER — Other Ambulatory Visit: Payer: Self-pay

## 2021-04-05 VITALS — BP 122/64 | HR 80 | Temp 98.6°F | Ht 69.0 in | Wt 159.8 lb

## 2021-04-05 DIAGNOSIS — I2510 Atherosclerotic heart disease of native coronary artery without angina pectoris: Secondary | ICD-10-CM

## 2021-04-05 DIAGNOSIS — G4733 Obstructive sleep apnea (adult) (pediatric): Secondary | ICD-10-CM | POA: Diagnosis not present

## 2021-04-05 NOTE — Assessment & Plan Note (Addendum)
Symptoms and witness reporting suggests sleep study under-estimated severity of OSA Not overweight. Not a candidate for weight loss and conservative measures not much help.  Marland Kitchen Best way forward is to treat for OSA and assess respsonse. We discussed options. He may eventually need sleep med in addition, and might be future candidate for oral appliance.

## 2021-04-05 NOTE — Assessment & Plan Note (Signed)
followed for cardiology status and denies recent acute changess.

## 2021-04-05 NOTE — Patient Instructions (Signed)
Order- new DME, new CPAP auto 4-15, mask of choice humidifier, supplies, AirView/ card  Please call as needed

## 2021-04-05 NOTE — Telephone Encounter (Signed)
Pt was called to discuss his lab results. Pt did not answer the phone. A VM was left to return the call.

## 2021-04-09 ENCOUNTER — Telehealth: Payer: Self-pay

## 2021-04-09 NOTE — Telephone Encounter (Signed)
Pt was called to discuss his lab results. Pt did not answer the phone so a message was left to call the office.

## 2021-04-10 NOTE — Progress Notes (Signed)
Pt came into the office to discuss his labs.

## 2021-04-18 ENCOUNTER — Telehealth: Payer: Self-pay | Admitting: Internal Medicine

## 2021-04-18 NOTE — Telephone Encounter (Signed)
Pt came into the office today and wanted to speak with someone in regards to his CPAP order and to gather more information in regards to [place] and how the process will work.  Pls regard; [phone]

## 2021-04-19 NOTE — Telephone Encounter (Signed)
Spoke with the pt  He is checking on status of CPAP  He has not called adap t I can see they confirmed having the order, so gave pt their phone number to call  Nothing further needed

## 2021-04-26 DIAGNOSIS — G4733 Obstructive sleep apnea (adult) (pediatric): Secondary | ICD-10-CM | POA: Diagnosis not present

## 2021-04-30 DIAGNOSIS — M79671 Pain in right foot: Secondary | ICD-10-CM | POA: Diagnosis not present

## 2021-04-30 DIAGNOSIS — L84 Corns and callosities: Secondary | ICD-10-CM | POA: Diagnosis not present

## 2021-04-30 DIAGNOSIS — M79672 Pain in left foot: Secondary | ICD-10-CM | POA: Diagnosis not present

## 2021-05-02 NOTE — Telephone Encounter (Signed)
A fax was received on 05/02/21 for CPAP follow up after new start. Patient has a follow up on 07/06/21. Paper sent to scan.

## 2021-05-03 ENCOUNTER — Institutional Professional Consult (permissible substitution): Payer: Medicare HMO | Admitting: Pulmonary Disease

## 2021-05-09 ENCOUNTER — Ambulatory Visit: Payer: Medicare HMO | Admitting: Adult Health

## 2021-05-16 ENCOUNTER — Telehealth: Payer: Self-pay | Admitting: Internal Medicine

## 2021-05-16 NOTE — Telephone Encounter (Signed)
Pt stated that he will be on his CPAP machine for 31 days by the end of this month and he stated that he was informed he would have to be seen by Dr. Annamaria Boots did inform the pt that he has an appt already scheduled with Dr. Annamaria Boots in Oct. Which is currently his first available and he just wanted to make sure that it was okay to wait that long and make sure that Dr. Annamaria Boots will want him to continue using the CPAP.  Pls regard; [phone]

## 2021-05-16 NOTE — Telephone Encounter (Signed)
Insurance companies usually want [place] to see patient 31- 90 days after getting the CPAP. October appointment should be within that window. Yes Just continue using it.

## 2021-05-16 NOTE — Telephone Encounter (Signed)
I called and spoke with patient regarding message. The window for CPAP is 31-90 days and the patient follow up appt is in that window and I do not see where Dr. Annamaria Boots wanted the patient to stop the CPAP so informed patient to keep using it. Patient verbalized understanding, nothing further needed.

## 2021-05-21 DIAGNOSIS — G4733 Obstructive sleep apnea (adult) (pediatric): Secondary | ICD-10-CM | POA: Diagnosis not present

## 2021-05-27 DIAGNOSIS — G4733 Obstructive sleep apnea (adult) (pediatric): Secondary | ICD-10-CM | POA: Diagnosis not present

## 2021-06-05 ENCOUNTER — Encounter: Payer: Self-pay | Admitting: Adult Health
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
## 2021-06-06 ENCOUNTER — Encounter: Payer: Self-pay | Admitting: Adult Health

## 2021-06-06 ENCOUNTER — Ambulatory Visit (INDEPENDENT_AMBULATORY_CARE_PROVIDER_SITE_OTHER): Payer: Medicare HMO | Admitting: Adult Health

## 2021-06-06 ENCOUNTER — Other Ambulatory Visit: Payer: Self-pay

## 2021-06-06 VITALS — BP 104/58 | HR 71 | Temp 97.7°F | Wt 162.0 lb

## 2021-06-06 DIAGNOSIS — I509 Heart failure, unspecified: Secondary | ICD-10-CM | POA: Diagnosis not present

## 2021-06-06 DIAGNOSIS — I10 Essential (primary) hypertension: Secondary | ICD-10-CM

## 2021-06-06 DIAGNOSIS — I739 Peripheral vascular disease, unspecified: Secondary | ICD-10-CM | POA: Diagnosis not present

## 2021-06-06 DIAGNOSIS — Z87891 Personal history of nicotine dependence: Secondary | ICD-10-CM

## 2021-06-06 DIAGNOSIS — R748 Abnormal levels of other serum enzymes: Secondary | ICD-10-CM | POA: Diagnosis not present

## 2021-06-06 DIAGNOSIS — E782 Mixed hyperlipidemia: Secondary | ICD-10-CM | POA: Diagnosis not present

## 2021-06-06 DIAGNOSIS — Z0000 Encounter for general adult medical examination without abnormal findings: Secondary | ICD-10-CM

## 2021-06-06 DIAGNOSIS — Z0001 Encounter for general adult medical examination with abnormal findings: Secondary | ICD-10-CM

## 2021-06-06 DIAGNOSIS — M791 Myalgia, unspecified site: Secondary | ICD-10-CM | POA: Diagnosis not present

## 2021-06-06 DIAGNOSIS — I2510 Atherosclerotic heart disease of native coronary artery without angina pectoris: Secondary | ICD-10-CM

## 2021-06-06 DIAGNOSIS — Z6823 Body mass index (BMI) 23.0-23.9, adult: Secondary | ICD-10-CM

## 2021-06-06 DIAGNOSIS — G4733 Obstructive sleep apnea (adult) (pediatric): Secondary | ICD-10-CM

## 2021-06-06 DIAGNOSIS — R6889 Other general symptoms and signs: Secondary | ICD-10-CM

## 2021-06-06 DIAGNOSIS — E559 Vitamin D deficiency, unspecified: Secondary | ICD-10-CM

## 2021-06-06 MED ORDER — LISINOPRIL 10 MG PO TABS: ORAL_TABLET | ORAL | 3 refills | Status: DC

## 2021-06-06 MED ORDER — EZETIMIBE 10 MG PO TABS: ORAL_TABLET | ORAL | 3 refills | Status: DC

## 2021-06-07 ENCOUNTER — Other Ambulatory Visit: Payer: Self-pay | Admitting: Adult Health

## 2021-06-07 DIAGNOSIS — I2510 Atherosclerotic heart disease of native coronary artery without angina pectoris: Secondary | ICD-10-CM

## 2021-06-07 DIAGNOSIS — M791 Myalgia, unspecified site: Secondary | ICD-10-CM

## 2021-06-07 DIAGNOSIS — R748 Abnormal levels of other serum enzymes: Secondary | ICD-10-CM

## 2021-06-07 DIAGNOSIS — D649 Anemia, unspecified: Secondary | ICD-10-CM

## 2021-06-07 DIAGNOSIS — I252 Old myocardial infarction: Secondary | ICD-10-CM

## 2021-06-07 MED ORDER — NEXLIZET 180-10 MG PO TABS
ORAL_TABLET | ORAL | 1 refills | Status: DC
Start: 2021-06-07 — End: 2022-04-14

## 2021-06-09 ENCOUNTER — Other Ambulatory Visit: Payer: Self-pay | Admitting: Adult Health

## 2021-06-09 DIAGNOSIS — D509 Iron deficiency anemia, unspecified: Secondary | ICD-10-CM

## 2021-06-09 LAB — COMPLETE METABOLIC PANEL WITH GFR
AG Ratio: 1.7 (calc) (ref 1.0–2.5)
ALT: 12 U/L (ref 9–46)
AST: 17 U/L (ref 10–35)
Albumin: 4 g/dL (ref 3.6–5.1)
Alkaline phosphatase (APISO): 74 U/L (ref 35–144)
BUN/Creatinine Ratio: 18 (calc) (ref 6–22)
BUN: 25 mg/dL (ref 7–25)
CO2: 26 mmol/L (ref 20–32)
Calcium: 9.4 mg/dL (ref 8.6–10.3)
Chloride: 100 mmol/L (ref 98–110)
Creat: 1.42 mg/dL — ABNORMAL HIGH (ref 0.70–1.35)
Globulin: 2.4 g/dL (calc) (ref 1.9–3.7)
Glucose, Bld: 86 mg/dL (ref 65–99)
Potassium: 4.3 mmol/L (ref 3.5–5.3)
Sodium: 136 mmol/L (ref 135–146)
Total Bilirubin: 0.3 mg/dL (ref 0.2–1.2)
Total Protein: 6.4 g/dL (ref 6.1–8.1)
eGFR: 54 mL/min/{1.73_m2} — ABNORMAL LOW (ref >=60)

## 2021-06-09 LAB — CBC WITH DIFFERENTIAL/PLATELET
Absolute Monocytes: 536 cells/uL (ref 200–950)
Basophils Absolute: 12 cells/uL (ref 0–200)
Basophils Relative: 0.3 %
Eosinophils Absolute: 252 cells/uL (ref 15–500)
Eosinophils Relative: 6.3 %
HCT: 28.8 % — ABNORMAL LOW (ref 38.5–50.0)
Hemoglobin: 9.1 g/dL — ABNORMAL LOW (ref 13.2–17.1)
Lymphs Abs: 1692 cells/uL (ref 850–3900)
MCH: 26.1 pg — ABNORMAL LOW (ref 27.0–33.0)
MCHC: 31.6 g/dL — ABNORMAL LOW (ref 32.0–36.0)
MCV: 82.5 fL (ref 80.0–100.0)
MPV: 9.9 fL (ref 7.5–12.5)
Monocytes Relative: 13.4 %
Neutro Abs: 1508 cells/uL (ref 1500–7800)
Neutrophils Relative %: 37.7 %
Platelets: 324 10*3/uL (ref 140–400)
RBC: 3.49 10*6/uL — ABNORMAL LOW (ref 4.20–5.80)
RDW: 13.7 % (ref 11.0–15.0)
Total Lymphocyte: 42.3 %
WBC: 4 10*3/uL (ref 3.8–10.8)

## 2021-06-09 LAB — TEST AUTHORIZATION 2

## 2021-06-09 LAB — LIPID PANEL
Cholesterol: 169 mg/dL (ref <200)
HDL: 45 mg/dL (ref >=40)
LDL Cholesterol (Calc): 98 mg/dL (calc)
Non-HDL Cholesterol (Calc): 124 mg/dL (calc) (ref <130)
Total CHOL/HDL Ratio: 3.8 (calc) (ref <5.0)
Triglycerides: 165 mg/dL — ABNORMAL HIGH (ref <150)

## 2021-06-09 LAB — IRON,TIBC AND FERRITIN PANEL
%SAT: 5 % (calc) — ABNORMAL LOW (ref 20–48)
Ferritin: 7 ng/mL — ABNORMAL LOW (ref 24–380)
Iron: 19 ug/dL — ABNORMAL LOW (ref 50–180)
TIBC: 388 mcg/dL (calc) (ref 250–425)

## 2021-06-09 LAB — TSH: TSH: 1.4 mIU/L (ref 0.40–4.50)

## 2021-06-09 LAB — CK: Total CK: 299 U/L — ABNORMAL HIGH (ref 44–196)

## 2021-06-09 LAB — MAGNESIUM: Magnesium: 2.1 mg/dL (ref 1.5–2.5)

## 2021-06-09 MED ORDER — PANTOPRAZOLE SODIUM 40 MG PO TBEC: DELAYED_RELEASE_TABLET | ORAL | 0 refills | Status: DC

## 2021-06-13 ENCOUNTER — Encounter: Payer: Self-pay | Admitting: Gastroenterology

## 2021-06-18 DIAGNOSIS — R748 Abnormal levels of other serum enzymes: Secondary | ICD-10-CM | POA: Diagnosis not present

## 2021-06-18 DIAGNOSIS — D509 Iron deficiency anemia, unspecified: Secondary | ICD-10-CM | POA: Diagnosis not present

## 2021-06-18 DIAGNOSIS — I2510 Atherosclerotic heart disease of native coronary artery without angina pectoris: Secondary | ICD-10-CM | POA: Diagnosis not present

## 2021-06-18 DIAGNOSIS — M25511 Pain in right shoulder: Secondary | ICD-10-CM | POA: Diagnosis not present

## 2021-06-18 DIAGNOSIS — M791 Myalgia, unspecified site: Secondary | ICD-10-CM | POA: Diagnosis not present

## 2021-06-18 DIAGNOSIS — E785 Hyperlipidemia, unspecified: Secondary | ICD-10-CM | POA: Diagnosis not present

## 2021-06-18 DIAGNOSIS — M722 Plantar fascial fibromatosis: Secondary | ICD-10-CM | POA: Diagnosis not present

## 2021-06-19 DIAGNOSIS — R748 Abnormal levels of other serum enzymes: Secondary | ICD-10-CM | POA: Diagnosis not present

## 2021-06-21 DIAGNOSIS — G4733 Obstructive sleep apnea (adult) (pediatric): Secondary | ICD-10-CM | POA: Diagnosis not present

## 2021-06-21 DIAGNOSIS — D509 Iron deficiency anemia, unspecified: Secondary | ICD-10-CM | POA: Diagnosis not present

## 2021-06-21 DIAGNOSIS — Z7901 Long term (current) use of anticoagulants: Secondary | ICD-10-CM | POA: Diagnosis not present

## 2021-06-21 DIAGNOSIS — Z86010 Personal history of colonic polyps: Secondary | ICD-10-CM | POA: Diagnosis not present

## 2021-06-22 ENCOUNTER — Encounter: Payer: Self-pay | Admitting: Neurology

## 2021-06-27 DIAGNOSIS — G4733 Obstructive sleep apnea (adult) (pediatric): Secondary | ICD-10-CM | POA: Diagnosis not present

## 2021-07-06 ENCOUNTER — Ambulatory Visit: Payer: Medicare HMO | Admitting: Internal Medicine

## 2021-07-12 ENCOUNTER — Ambulatory Visit: Payer: Medicare HMO | Admitting: Gastroenterology

## 2021-07-18 DIAGNOSIS — D509 Iron deficiency anemia, unspecified: Secondary | ICD-10-CM | POA: Diagnosis not present

## 2021-07-18 DIAGNOSIS — K644 Residual hemorrhoidal skin tags: Secondary | ICD-10-CM | POA: Diagnosis not present

## 2021-07-18 DIAGNOSIS — K222 Esophageal obstruction: Secondary | ICD-10-CM | POA: Diagnosis not present

## 2021-07-18 DIAGNOSIS — K449 Diaphragmatic hernia without obstruction or gangrene: Secondary | ICD-10-CM | POA: Diagnosis not present

## 2021-07-18 DIAGNOSIS — D122 Benign neoplasm of ascending colon: Secondary | ICD-10-CM | POA: Diagnosis not present

## 2021-07-18 DIAGNOSIS — K2930 Chronic superficial gastritis without bleeding: Secondary | ICD-10-CM | POA: Diagnosis not present

## 2021-07-18 DIAGNOSIS — K648 Other hemorrhoids: Secondary | ICD-10-CM | POA: Diagnosis not present

## 2021-07-19 DIAGNOSIS — H5203 Hypermetropia, bilateral: Secondary | ICD-10-CM | POA: Diagnosis not present

## 2021-07-19 DIAGNOSIS — H52209 Unspecified astigmatism, unspecified eye: Secondary | ICD-10-CM | POA: Diagnosis not present

## 2021-07-19 DIAGNOSIS — H524 Presbyopia: Secondary | ICD-10-CM | POA: Diagnosis not present

## 2021-07-19 NOTE — Progress Notes (Signed)
HPI M former smoker followed for OSA, complicated by CAD/ MI, HTN, PAD, CHF, Hyperlipidemia, Vit D deficiency NPSG 03/01/21- AHi 5.6/ hr, desaturation to 92%, body weight 161 lbs  ==============================================================  04/05/21- 69 yoM former smoker for sleep evaluation courtesy of [facility] with concern of sleep apnea and daytime drowsiness. Medical problem list includes   CAD/ MI, HTN, PAD, CHF, Hyperlipidemia, Vit D deficiency NPSG 03/01/21- AHi 5.6/ hr, desaturation to 92%, body weight 161 lbs Epworth score-6 Body weight today-159 lbs Covid vax-3 Phizer -----Sleep Consult-daytime sleepiness, snoring Wife tells him he snores loudly with witnessed apneas and he notes persistent daytime fatigue over the past 10 years. Does not fall asleep in day, but nods off with TV, drags. No sleep meds, 1 cup coffee. Sister with OSA. Denies complex parasomnias. Nocturia x1, then back to sleep.  No ENT surgery or known lung disease, but + CAD/ MI.   07/20/21- 69 yoM former smoker followed for OSA, complicated by CAD/ MI, HTN, PAD, CHF, Hyperlipidemia, Vit D deficiency CPAP auto 4-15/ [employer]   ordered 04/05/21 Download-compliance 100%, AHI 2.5/ hr Body weight today-166 lbs Covid vax- 4 Phizer Flu vax-declines Comfortable with CPAP except that he has a mask leak.  We can refer for mask fitting and he will talk with his DME company as well. Complains that legs tire easily while walking-no cramps but suspicious for claudication.  Has cardiologist at [facility].  ROS-see HPI   + = positive Constitutional:    weight loss, night sweats, fevers, chills, +fatigue, lassitude. HEENT:    headaches, difficulty swallowing, tooth/dental problems, sore throat,       sneezing, itching, ear ache, nasal congestion, post nasal drip, snoring CV:    chest pain, orthopnea, PND, swelling in lower extremities, anasarca,                                   dizziness, palpitations Resp:    shortness of breath with exertion or at rest.                productive cough,   non-productive cough, coughing up of blood.              change in color of mucus.  wheezing.   Skin:    rash or lesions. GI:  No-   heartburn, indigestion, abdominal pain, nausea, vomiting, diarrhea,                 change in bowel habits, loss of appetite GU: dysuria, change in color of urine, no urgency or frequency.   flank pain. MS:   joint pain, stiffness, decreased range of motion, back pain. Neuro-     nothing unusual Psych:  change in mood or affect.  depression or anxiety.   memory loss.  OBJ- Physical Exam General- Alert, Oriented, Affect-appropriate, Distress- none acute, slender Skin- rash-none, lesions- none, excoriation- none Lymphadenopathy- none Head- atraumatic            Eyes- Gross vision intact, PERRLA, conjunctivae and secretions clear            Ears- Hearing, canals-normal            Nose- Clear, no-Septal dev, mucus, polyps, erosion, perforation             Throat- Mallampati III-IV , mucosa clear , drainage- none, tonsils- atrophic, +teeth Neck- flexible , trachea midline, no stridor , thyroid  nl, carotid no bruit Chest - symmetrical excursion , unlabored           Heart/CV- RRR , no murmur , no gallop  , no rub, nl s1 s2                           - JVD- none , edema- none, stasis changes- none, varices- none           Lung- clear to P&A, wheeze- none, cough- none , dullness-none, rub- none           Chest wall-  Abd-  Br/ Gen/ Rectal- Not done, not indicated Extrem- cyanosis- none, clubbing, none, atrophy- none, strength- nl Neuro- grossly intact to observation

## 2021-07-20 ENCOUNTER — Encounter: Payer: Self-pay | Admitting: Internal Medicine

## 2021-07-20 ENCOUNTER — Ambulatory Visit: Payer: Medicare HMO | Admitting: Internal Medicine

## 2021-07-20 ENCOUNTER — Other Ambulatory Visit: Payer: Self-pay

## 2021-07-20 DIAGNOSIS — I739 Peripheral vascular disease, unspecified: Secondary | ICD-10-CM

## 2021-07-20 DIAGNOSIS — G4733 Obstructive sleep apnea (adult) (pediatric): Secondary | ICD-10-CM

## 2021-07-20 NOTE — Patient Instructions (Signed)
[place]- [facility] [employer]- please service CPAP for headgear leak. Continue auto 4-15, mask of choice, humidifier, supplies,   [place]ase install AirView  Order- referral to sleep disorder center for mask fitting  Please call if we can help

## 2021-07-20 NOTE — Assessment & Plan Note (Signed)
Benefits from CPAP with improved sleep  Plan- continue auto 4-15. [employer] service for headgear leak. Mask fitting

## 2021-07-21 DIAGNOSIS — G4733 Obstructive sleep apnea (adult) (pediatric): Secondary | ICD-10-CM | POA: Diagnosis not present

## 2021-07-23 DIAGNOSIS — D122 Benign neoplasm of ascending colon: Secondary | ICD-10-CM | POA: Diagnosis not present

## 2021-07-23 DIAGNOSIS — K2930 Chronic superficial gastritis without bleeding: Secondary | ICD-10-CM | POA: Diagnosis not present

## 2021-07-27 DIAGNOSIS — I739 Peripheral vascular disease, unspecified: Secondary | ICD-10-CM | POA: Diagnosis not present

## 2021-07-27 DIAGNOSIS — G4733 Obstructive sleep apnea (adult) (pediatric): Secondary | ICD-10-CM | POA: Diagnosis not present

## 2021-07-27 DIAGNOSIS — I10 Essential (primary) hypertension: Secondary | ICD-10-CM | POA: Diagnosis not present

## 2021-07-27 DIAGNOSIS — I25118 Atherosclerotic heart disease of native coronary artery with other forms of angina pectoris: Secondary | ICD-10-CM | POA: Diagnosis not present

## 2021-08-06 DIAGNOSIS — G4733 Obstructive sleep apnea (adult) (pediatric): Secondary | ICD-10-CM | POA: Diagnosis not present

## 2021-08-07 ENCOUNTER — Other Ambulatory Visit (HOSPITAL_BASED_OUTPATIENT_CLINIC_OR_DEPARTMENT_OTHER): Payer: Medicare HMO | Admitting: Internal Medicine

## 2021-08-09 ENCOUNTER — Encounter: Payer: Self-pay | Admitting: Internal Medicine

## 2021-08-13 DIAGNOSIS — D509 Iron deficiency anemia, unspecified: Secondary | ICD-10-CM | POA: Diagnosis not present

## 2021-08-27 DIAGNOSIS — G4733 Obstructive sleep apnea (adult) (pediatric): Secondary | ICD-10-CM | POA: Diagnosis not present

## 2021-09-03 ENCOUNTER — Ambulatory Visit: Payer: Medicare HMO | Admitting: Neurology
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
## 2021-09-12 ENCOUNTER — Ambulatory Visit: Payer: Medicare HMO | Admitting: Nurse Practitioner

## 2021-09-12 DIAGNOSIS — G4733 Obstructive sleep apnea (adult) (pediatric): Secondary | ICD-10-CM | POA: Diagnosis not present

## 2021-09-12 DIAGNOSIS — N1831 Chronic kidney disease, stage 3a: Secondary | ICD-10-CM

## 2021-09-12 DIAGNOSIS — E782 Mixed hyperlipidemia: Secondary | ICD-10-CM

## 2021-09-12 DIAGNOSIS — E559 Vitamin D deficiency, unspecified: Secondary | ICD-10-CM

## 2021-09-12 DIAGNOSIS — I10 Essential (primary) hypertension: Secondary | ICD-10-CM

## 2021-09-12 DIAGNOSIS — I739 Peripheral vascular disease, unspecified: Secondary | ICD-10-CM

## 2021-09-12 DIAGNOSIS — I2510 Atherosclerotic heart disease of native coronary artery without angina pectoris: Secondary | ICD-10-CM

## 2021-09-12 DIAGNOSIS — Z87891 Personal history of nicotine dependence: Secondary | ICD-10-CM

## 2021-09-12 DIAGNOSIS — Z79899 Other long term (current) drug therapy: Secondary | ICD-10-CM

## 2021-09-18 ENCOUNTER — Other Ambulatory Visit: Payer: Self-pay

## 2021-09-18 DIAGNOSIS — R202 Paresthesia of skin: Secondary | ICD-10-CM

## 2021-09-19 ENCOUNTER — Ambulatory Visit: Payer: Medicare HMO | Admitting: Neurology

## 2021-09-19 ENCOUNTER — Other Ambulatory Visit: Payer: Self-pay

## 2021-09-19 DIAGNOSIS — R202 Paresthesia of skin: Secondary | ICD-10-CM

## 2021-09-19 DIAGNOSIS — M5412 Radiculopathy, cervical region: Secondary | ICD-10-CM

## 2021-09-19 NOTE — Procedures (Signed)
[facility]  [address]  [place], [place] [address] Tel: [phone] Fax:  [phone] Test Date:  09/19/2021  Patient: Patrick Tucker DOB: [birth year] Physician: Narda Amber, DO  Sex: Male Height: 5\' 9"  Ref Phys: Lahoma Rocker, MD  ID#: [id number]   Technician:    Patient Complaints: This is a 70 year old man referred for evaluation of myalgias and hyperCKemia.  NCV & EMG Findings: Extensive electrodiagnostic testing of the right upper and lower extremity shows:  Right median, ulnar, sural, and superficial peroneal sensory responses are within normal limits. Right median, ulnar, peroneal, and tibial motor responses are within normal limits. Right tibial H reflex study is within normal limits. In the right upper extremity, chronic motor axon loss changes are seen affecting the deltoid and biceps muscles, without accompanying active denervation. In the right lower extremity, there is no evidence of active or chronic motor axonal loss changes affecting any of the tested muscles.  Specifically, there is no evidence of small, complex motor units.  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 There is no evidence of a diffuse myopathy or sensorimotor polyneuropathy affecting the right side.   ___________________________ Narda Amber, DO    Nerve Conduction Studies Anti Sensory Summary Table   Stim Site NR Peak (ms) Norm Peak (ms) P-T Amp (V) Norm P-T Amp  Right Median Anti Sensory (2nd Digit)  32C  Wrist    3.2 <3.8 20.3 >10  Right Sup Peroneal Anti Sensory (Ant Lat Mall)  32C  12 cm    3.7 <4.6 13.1 >3  Right Sural Anti Sensory (Lat Mall)  32C  Calf    3.0 <4.6 8.4 >3  Right Ulnar Anti Sensory (5th Digit)  32C  Wrist    3.1 <3.2 17.0 >5   Motor Summary Table   Stim Site NR Onset (ms) Norm Onset (ms) O-P Amp (mV) Norm O-P Amp Site1 Site2 Delta-0 (ms) Dist (cm) Vel (m/s) Norm Vel (m/s)  Right Median  Motor (Abd Poll Brev)  32C  Wrist    3.6 <4.0 7.5 >5 Elbow Wrist 5.4 30.0 56 >50  Elbow    9.0  7.5         Right Peroneal Motor (Ext Dig Brev)  32C  Ankle    3.6 <6.0 4.6 >2.5 B Fib Ankle 9.0 39.0 43 >40  B Fib    12.6  4.0  Poplt B Fib 1.7 9.0 53 >40  Poplt    14.3  3.9         Right Peroneal TA Motor (Tib Ant)  32C  Fib Head    2.4 <4.5 4.1 >3 Poplit Fib Head 1.8 10.0 56 >40  Poplit    4.2  3.8         Right Tibial Motor (Abd Hall Brev)  32C  Ankle    3.2 <6.0 10.5 >4 Knee Ankle 10.2 43.0 42 >40  Knee    13.4  8.0         Right Ulnar Motor (Abd Dig Minimi)  32C  Wrist    2.6 <3.1 12.2 >7 B Elbow Wrist 4.3 22.0 51 >50  B Elbow    6.9  11.1  A Elbow B Elbow 1.9 10.0 53 >50  A Elbow    8.8  10.6          H Reflex Studies   NR H-Lat (ms) Lat Norm (ms) L-R H-Lat (ms)  Right Tibial (Gastroc)  32C  31.16 <35    EMG   Side Muscle Ins Act Fibs Psw Fasc Number Recrt Dur Dur. Amp Amp. Poly Poly. Comment  Right AntTibialis Nml Nml Nml Nml Nml Nml Nml Nml Nml Nml Nml Nml N/A  Right Gastroc Nml Nml Nml Nml Nml Nml Nml Nml Nml Nml Nml Nml N/A  Right RectFemoris Nml Nml Nml Nml Nml Nml Nml Nml Nml Nml Nml Nml N/A  Right GluteusMed Nml Nml Nml Nml Nml Nml Nml Nml Nml Nml Nml Nml N/A  Right Lumbo Parasp Low Nml Nml Nml Nml Nml Nml Nml Nml Nml Nml Nml Nml N/A  Right 1stDorInt Nml Nml Nml Nml Nml Nml Nml Nml Nml Nml Nml Nml N/A  Right PronatorTeres Nml Nml Nml Nml Nml Nml Nml Nml Nml Nml Nml Nml N/A  Right Biceps Nml Nml Nml Nml 1- Rapid Many 1+ Many 1+ Many 1+ N/A  Right Triceps Nml Nml Nml Nml Nml Nml Nml Nml Nml Nml Nml Nml N/A  Right Deltoid Nml Nml Nml Nml 1- Rapid Many 1+ Many 1+ Many 1+ N/A      Waveforms:
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
## 2021-09-26 ENCOUNTER — Other Ambulatory Visit: Payer: Self-pay

## 2021-09-26 ENCOUNTER — Encounter: Payer: Self-pay | Admitting: Nurse Practitioner

## 2021-09-26 ENCOUNTER — Ambulatory Visit (INDEPENDENT_AMBULATORY_CARE_PROVIDER_SITE_OTHER): Payer: Medicare HMO | Admitting: Nurse Practitioner

## 2021-09-26 VITALS — BP 120/68 | HR 72 | Temp 97.5°F | Wt 165.0 lb

## 2021-09-26 DIAGNOSIS — I10 Essential (primary) hypertension: Secondary | ICD-10-CM

## 2021-09-26 DIAGNOSIS — G4733 Obstructive sleep apnea (adult) (pediatric): Secondary | ICD-10-CM | POA: Diagnosis not present

## 2021-09-26 DIAGNOSIS — Z87891 Personal history of nicotine dependence: Secondary | ICD-10-CM

## 2021-09-26 DIAGNOSIS — E782 Mixed hyperlipidemia: Secondary | ICD-10-CM | POA: Diagnosis not present

## 2021-09-26 DIAGNOSIS — I2510 Atherosclerotic heart disease of native coronary artery without angina pectoris: Secondary | ICD-10-CM | POA: Diagnosis not present

## 2021-09-26 DIAGNOSIS — Z79899 Other long term (current) drug therapy: Secondary | ICD-10-CM | POA: Diagnosis not present

## 2021-09-26 DIAGNOSIS — I739 Peripheral vascular disease, unspecified: Secondary | ICD-10-CM | POA: Diagnosis not present

## 2021-09-26 DIAGNOSIS — Z6824 Body mass index (BMI) 24.0-24.9, adult: Secondary | ICD-10-CM

## 2021-09-26 DIAGNOSIS — E559 Vitamin D deficiency, unspecified: Secondary | ICD-10-CM | POA: Diagnosis not present

## 2021-09-26 DIAGNOSIS — N1831 Chronic kidney disease, stage 3a: Secondary | ICD-10-CM

## 2021-09-27 LAB — COMPLETE METABOLIC PANEL WITH GFR
AG Ratio: 2 (calc) (ref 1.0–2.5)
ALT: 35 U/L (ref 9–46)
AST: 21 U/L (ref 10–35)
Albumin: 4.2 g/dL (ref 3.6–5.1)
Alkaline phosphatase (APISO): 110 U/L (ref 35–144)
BUN: 16 mg/dL (ref 7–25)
CO2: 29 mmol/L (ref 20–32)
Calcium: 9.4 mg/dL (ref 8.6–10.3)
Chloride: 105 mmol/L (ref 98–110)
Creat: 1 mg/dL (ref 0.70–1.35)
Globulin: 2.1 g/dL (calc) (ref 1.9–3.7)
Glucose, Bld: 93 mg/dL (ref 65–99)
Potassium: 4.4 mmol/L (ref 3.5–5.3)
Sodium: 141 mmol/L (ref 135–146)
Total Bilirubin: 0.3 mg/dL (ref 0.2–1.2)
Total Protein: 6.3 g/dL (ref 6.1–8.1)
eGFR: 82 mL/min/{1.73_m2} (ref >=60)

## 2021-09-27 LAB — CBC WITH DIFFERENTIAL/PLATELET
Absolute Monocytes: 607 cells/uL (ref 200–950)
Basophils Absolute: 30 cells/uL (ref 0–200)
Basophils Relative: 0.8 %
Eosinophils Absolute: 178 cells/uL (ref 15–500)
Eosinophils Relative: 4.8 %
HCT: 38.6 % (ref 38.5–50.0)
Hemoglobin: 12 g/dL — ABNORMAL LOW (ref 13.2–17.1)
Lymphs Abs: 1521 cells/uL (ref 850–3900)
MCH: 26.1 pg — ABNORMAL LOW (ref 27.0–33.0)
MCHC: 31.1 g/dL — ABNORMAL LOW (ref 32.0–36.0)
MCV: 83.9 fL (ref 80.0–100.0)
MPV: 10.8 fL (ref 7.5–12.5)
Monocytes Relative: 16.4 %
Neutro Abs: 1365 cells/uL — ABNORMAL LOW (ref 1500–7800)
Neutrophils Relative %: 36.9 %
Platelets: 237 10*3/uL (ref 140–400)
RBC: 4.6 10*6/uL (ref 4.20–5.80)
RDW: 15.8 % — ABNORMAL HIGH (ref 11.0–15.0)
Total Lymphocyte: 41.1 %
WBC: 3.7 10*3/uL — ABNORMAL LOW (ref 3.8–10.8)

## 2021-09-27 LAB — LIPID PANEL
Cholesterol: 116 mg/dL (ref <200)
HDL: 47 mg/dL (ref >=40)
LDL Cholesterol (Calc): 49 mg/dL (calc)
Non-HDL Cholesterol (Calc): 69 mg/dL (calc) (ref <130)
Total CHOL/HDL Ratio: 2.5 (calc) (ref <5.0)
Triglycerides: 116 mg/dL (ref <150)

## 2021-09-27 LAB — TSH: TSH: 1.15 mIU/L (ref 0.40–4.50)

## 2021-10-13 DIAGNOSIS — G4733 Obstructive sleep apnea (adult) (pediatric): Secondary | ICD-10-CM | POA: Diagnosis not present

## 2021-10-17 DIAGNOSIS — R748 Abnormal levels of other serum enzymes: Secondary | ICD-10-CM | POA: Diagnosis not present

## 2021-10-17 DIAGNOSIS — I2510 Atherosclerotic heart disease of native coronary artery without angina pectoris: Secondary | ICD-10-CM | POA: Diagnosis not present

## 2021-10-17 DIAGNOSIS — M791 Myalgia, unspecified site: Secondary | ICD-10-CM | POA: Diagnosis not present

## 2021-10-17 DIAGNOSIS — M5412 Radiculopathy, cervical region: Secondary | ICD-10-CM | POA: Diagnosis not present

## 2021-10-17 DIAGNOSIS — E785 Hyperlipidemia, unspecified: Secondary | ICD-10-CM | POA: Diagnosis not present

## 2021-10-27 DIAGNOSIS — G4733 Obstructive sleep apnea (adult) (pediatric): Secondary | ICD-10-CM | POA: Diagnosis not present

## 2021-10-29 DIAGNOSIS — G4733 Obstructive sleep apnea (adult) (pediatric): Secondary | ICD-10-CM | POA: Diagnosis not present

## 2021-10-30 DIAGNOSIS — D509 Iron deficiency anemia, unspecified: Secondary | ICD-10-CM | POA: Diagnosis not present

## 2021-11-02 DIAGNOSIS — G4733 Obstructive sleep apnea (adult) (pediatric): Secondary | ICD-10-CM | POA: Diagnosis not present

## 2021-11-13 DIAGNOSIS — G4733 Obstructive sleep apnea (adult) (pediatric): Secondary | ICD-10-CM | POA: Diagnosis not present

## 2021-11-14 ENCOUNTER — Encounter: Payer: Self-pay | Admitting: Internal Medicine

## 2021-11-14 NOTE — Assessment & Plan Note (Signed)
Suspect this is basis for his complaint that legs feel tired when walking.  I asked him to speak with his cardiologist about it.

## 2021-11-16 IMAGING — DX DG CHEST 1V PORT
1 series · 1 of 1 positions shown · non-contrast
Comparison: None.

CLINICAL DATA: Chest pain and shortness of breath.

EXAM:
PORTABLE CHEST 1 VIEW

[chest]
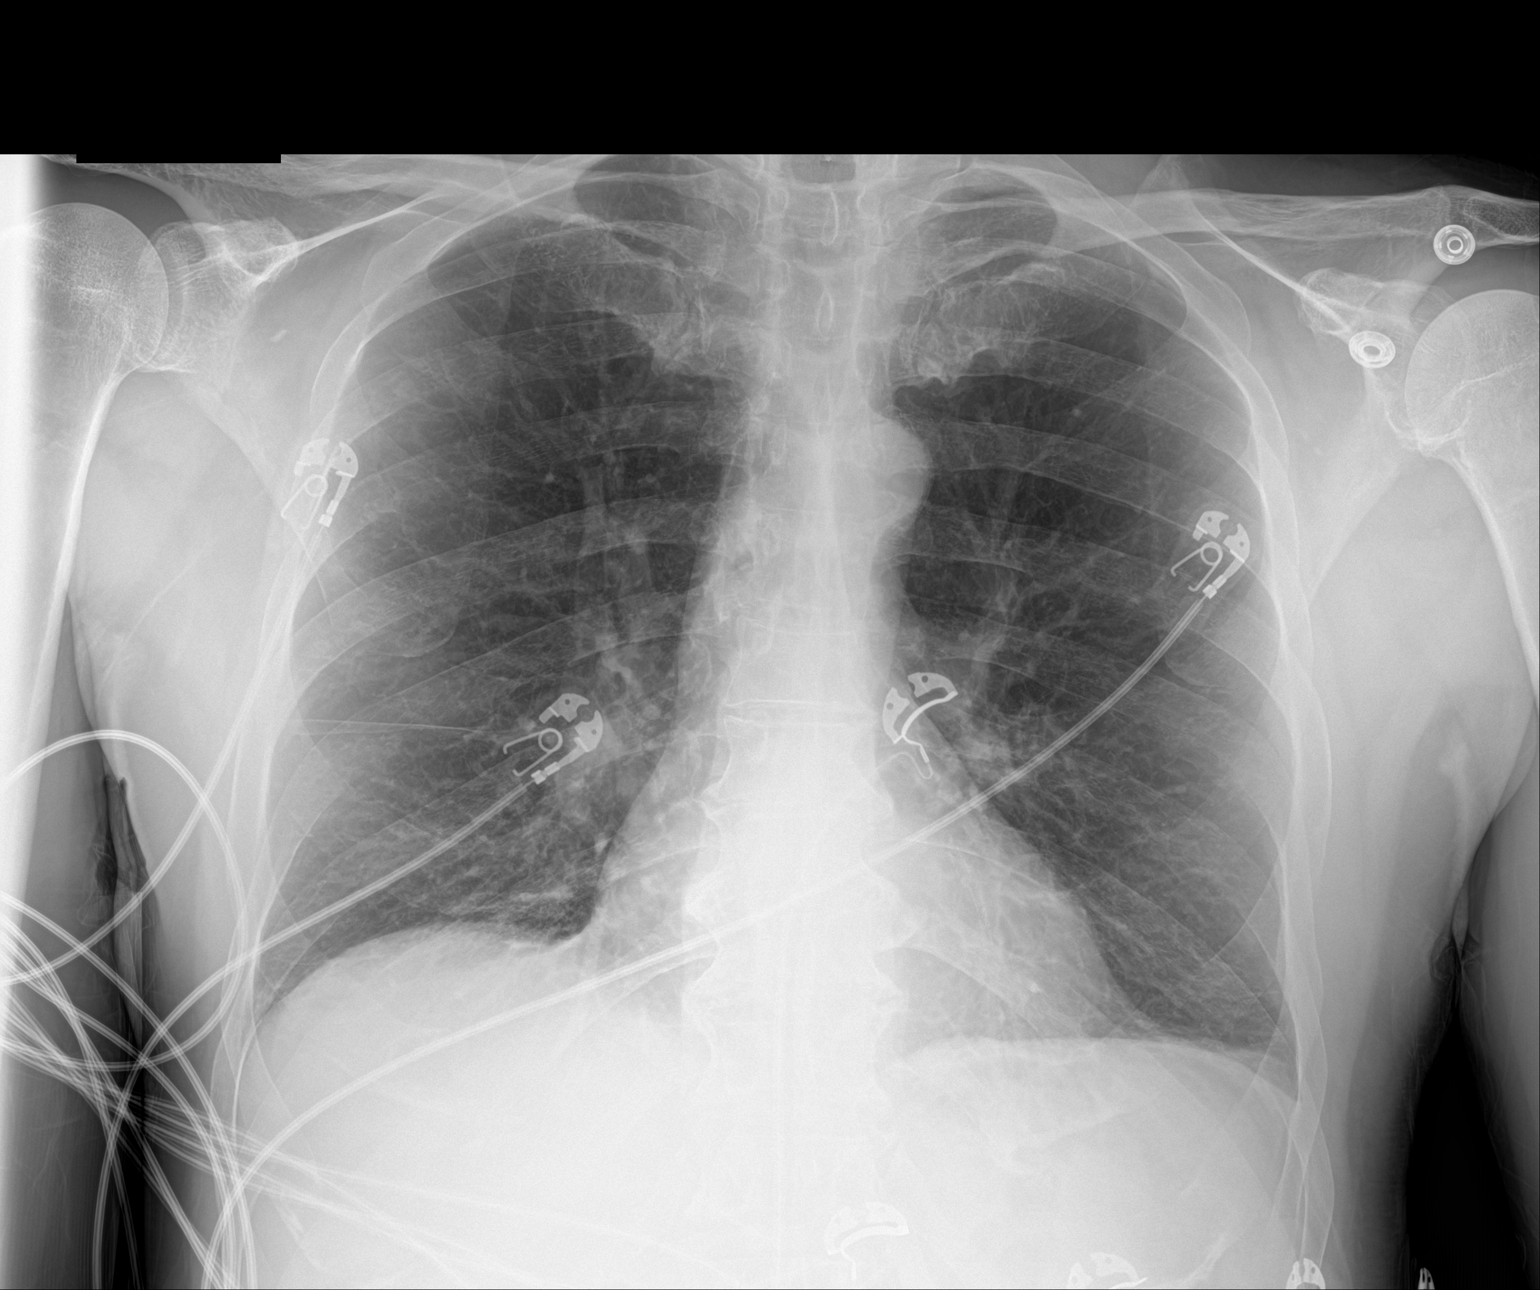

[1 of 1 positions shown; findings below may reference images not displayed]

FINDINGS: There is no evidence of acute infiltrate, pleural effusion or
pneumothorax. The heart size and mediastinal contours are within
normal limits. There is a chronic seventh left rib fracture.
Multilevel degenerative changes seen within the mid and lower
thoracic spine.
IMPRESSION: No active disease.

## 2021-11-27 ENCOUNTER — Other Ambulatory Visit: Payer: Self-pay | Admitting: Family Medicine

## 2021-11-27 DIAGNOSIS — G4733 Obstructive sleep apnea (adult) (pediatric): Secondary | ICD-10-CM | POA: Diagnosis not present

## 2021-12-17 ENCOUNTER — Other Ambulatory Visit: Payer: Self-pay | Admitting: Internal Medicine

## 2021-12-17 DIAGNOSIS — K2100 Gastro-esophageal reflux disease with esophagitis, without bleeding: Secondary | ICD-10-CM

## 2021-12-17 MED ORDER — PANTOPRAZOLE SODIUM 40 MG PO TBEC: DELAYED_RELEASE_TABLET | ORAL | 3 refills | Status: DC

## 2021-12-21 DIAGNOSIS — H1131 Conjunctival hemorrhage, right eye: Secondary | ICD-10-CM | POA: Diagnosis not present

## 2021-12-25 DIAGNOSIS — G4733 Obstructive sleep apnea (adult) (pediatric): Secondary | ICD-10-CM | POA: Diagnosis not present
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
## 2021-12-27 ENCOUNTER — Encounter: Payer: Self-pay | Admitting: Nurse Practitioner

## 2021-12-27 ENCOUNTER — Ambulatory Visit (INDEPENDENT_AMBULATORY_CARE_PROVIDER_SITE_OTHER): Payer: Medicare HMO | Admitting: Nurse Practitioner

## 2021-12-27 VITALS — BP 122/60 | HR 73 | Temp 97.5°F | Wt 164.6 lb

## 2021-12-27 DIAGNOSIS — I10 Essential (primary) hypertension: Secondary | ICD-10-CM

## 2021-12-27 DIAGNOSIS — I2510 Atherosclerotic heart disease of native coronary artery without angina pectoris: Secondary | ICD-10-CM | POA: Diagnosis not present

## 2021-12-27 DIAGNOSIS — Z6824 Body mass index (BMI) 24.0-24.9, adult: Secondary | ICD-10-CM

## 2021-12-27 DIAGNOSIS — E559 Vitamin D deficiency, unspecified: Secondary | ICD-10-CM | POA: Diagnosis not present

## 2021-12-27 DIAGNOSIS — Z79899 Other long term (current) drug therapy: Secondary | ICD-10-CM | POA: Diagnosis not present

## 2021-12-27 DIAGNOSIS — Z9989 Dependence on other enabling machines and devices: Secondary | ICD-10-CM

## 2021-12-27 DIAGNOSIS — N182 Chronic kidney disease, stage 2 (mild): Secondary | ICD-10-CM | POA: Diagnosis not present

## 2021-12-27 DIAGNOSIS — G4733 Obstructive sleep apnea (adult) (pediatric): Secondary | ICD-10-CM

## 2021-12-27 DIAGNOSIS — E782 Mixed hyperlipidemia: Secondary | ICD-10-CM

## 2021-12-27 DIAGNOSIS — I739 Peripheral vascular disease, unspecified: Secondary | ICD-10-CM

## 2021-12-27 DIAGNOSIS — R7309 Other abnormal glucose: Secondary | ICD-10-CM | POA: Diagnosis not present

## 2021-12-27 DIAGNOSIS — Z87891 Personal history of nicotine dependence: Secondary | ICD-10-CM | POA: Diagnosis not present

## 2021-12-28 LAB — COMPLETE METABOLIC PANEL WITH GFR
AG Ratio: 1.9 (calc) (ref 1.0–2.5)
ALT: 33 U/L (ref 9–46)
AST: 28 U/L (ref 10–35)
Albumin: 4.1 g/dL (ref 3.6–5.1)
Alkaline phosphatase (APISO): 113 U/L (ref 35–144)
BUN: 17 mg/dL (ref 7–25)
CO2: 28 mmol/L (ref 20–32)
Calcium: 9.2 mg/dL (ref 8.6–10.3)
Chloride: 101 mmol/L (ref 98–110)
Creat: 1.07 mg/dL (ref 0.70–1.35)
Globulin: 2.2 g/dL (calc) (ref 1.9–3.7)
Glucose, Bld: 84 mg/dL (ref 65–99)
Potassium: 4.3 mmol/L (ref 3.5–5.3)
Sodium: 136 mmol/L (ref 135–146)
Total Bilirubin: 0.3 mg/dL (ref 0.2–1.2)
Total Protein: 6.3 g/dL (ref 6.1–8.1)
eGFR: 76 mL/min/{1.73_m2} (ref >=60)

## 2021-12-28 LAB — CBC WITH DIFFERENTIAL/PLATELET
Absolute Monocytes: 619 cells/uL (ref 200–950)
Basophils Absolute: 42 cells/uL (ref 0–200)
Basophils Relative: 1.1 %
Eosinophils Absolute: 285 cells/uL (ref 15–500)
Eosinophils Relative: 7.5 %
HCT: 39.9 % (ref 38.5–50.0)
Hemoglobin: 13.1 g/dL — ABNORMAL LOW (ref 13.2–17.1)
Lymphs Abs: 1471 cells/uL (ref 850–3900)
MCH: 29.2 pg (ref 27.0–33.0)
MCHC: 32.8 g/dL (ref 32.0–36.0)
MCV: 88.9 fL (ref 80.0–100.0)
MPV: 11 fL (ref 7.5–12.5)
Monocytes Relative: 16.3 %
Neutro Abs: 1383 cells/uL — ABNORMAL LOW (ref 1500–7800)
Neutrophils Relative %: 36.4 %
Platelets: 218 10*3/uL (ref 140–400)
RBC: 4.49 10*6/uL (ref 4.20–5.80)
RDW: 15.3 % — ABNORMAL HIGH (ref 11.0–15.0)
Total Lymphocyte: 38.7 %
WBC: 3.8 10*3/uL (ref 3.8–10.8)

## 2021-12-28 LAB — LIPID PANEL
Cholesterol: 106 mg/dL (ref <200)
HDL: 40 mg/dL (ref >=40)
LDL Cholesterol (Calc): 46 mg/dL (calc)
Non-HDL Cholesterol (Calc): 66 mg/dL (calc) (ref <130)
Total CHOL/HDL Ratio: 2.7 (calc) (ref <5.0)
Triglycerides: 115 mg/dL (ref <150)

## 2021-12-28 LAB — HEMOGLOBIN A1C
Hgb A1c MFr Bld: 6.3 % of total Hgb — ABNORMAL HIGH (ref <5.7)
Mean Plasma Glucose: 134 mg/dL
eAG (mmol/L): 7.4 mmol/L

## 2022-01-16 ENCOUNTER — Encounter: Payer: Self-pay | Admitting: Internal Medicine

## 2022-01-16 NOTE — Progress Notes (Signed)
HPI ?M former smoker followed for OSA, complicated by CAD/ MI, HTN, PAD, CHF, Hyperlipidemia, Vit D deficiency ?NPSG 03/01/21- AHi 5.6/ hr, desaturation to 92%, body weight 161 lbs ? ?============================================================== ? ? ?07/20/21- 69 yoM former smoker followed for OSA, complicated by CAD/ MI, HTN, PAD, CHF, Hyperlipidemia, Vit D deficiency ?CPAP auto 4-15/ [facility]   ordered 04/05/21 ?Download-compliance 100%, AHI 2.5/ hr ?Body weight today-166 lbs ?Covid vax- 4 Phizer ?Flu vax-declines ?Comfortable with CPAP except that he has a mask leak.  We can refer for mask fitting and he will talk with his DME company as well. ?Complains that legs tire easily while walking-no cramps but suspicious for claudication.  Has cardiologist at [facility]. ? ?01/17/22- 70 yoM former smoker followed for OSA, complicated by CAD/ MI, HTN, PAD, CHF, Hyperlipidemia, Vit D deficiency ?CPAP auto 4-15/ [facility]   ordered 04/05/21 ?Download-compliance 98%, AHI 1.7/ hr ?Body weight today-161 lbs ?Covid vax- 5 Phizer ?Flu vax-declines ?-----Patient is doing good states that he wakes up sometimes during the night and stares at the wall but then will fall back asleep ?Download reviewed. Says he wakes about 15 minutes most nights, like habit, but then goes back to sleep. Declines sleep med. ?Bedtime 8:30, up around 4:00AM. ?Walks 2 miles and does 100 push ups each mornng. ? ? ?ROS-see HPI   + = positive ?Constitutional:    weight loss, night sweats, fevers, chills, +fatigue, lassitude. ?HEENT:    headaches, difficulty swallowing, tooth/dental problems, sore throat,  ?     sneezing, itching, ear ache, nasal congestion, post nasal drip, snoring ?CV:    chest pain, orthopnea, PND, swelling in lower extremities, anasarca,                                  ? dizziness, palpitations ?Resp:   shortness of breath with exertion or at rest.   ?             productive cough,   non-productive cough, coughing up of blood.   ?           change  in color of mucus.  wheezing.   ?Skin:    rash or lesions. ?GI:  No-   heartburn, indigestion, abdominal pain, nausea, vomiting, diarrhea,  ?               change in bowel habits, loss of appetite ?GU: dysuria, change in color of urine, no urgency or frequency.   flank pain. ?MS:   joint pain, stiffness, decreased range of motion, back pain. ?Neuro-     nothing unusual ?Psych:  change in mood or affect.  depression or anxiety.   memory loss. ? ?OBJ- Physical Exam ?General- Alert, Oriented, Affect-appropriate, Distress- none acute, slender ?Skin- rash-none, lesions- none, excoriation- none ?Lymphadenopathy- none ?Head- atraumatic ?           Eyes- Gross vision intact, PERRLA, conjunctivae and secretions clear ?           Ears- Hearing, canals-normal ?           Nose- Clear, no-Septal dev, mucus, polyps, erosion, perforation  ?           Throat- Mallampati III-IV , mucosa clear , drainage- none, tonsils- atrophic, +teeth ?Neck- flexible , trachea midline, no stridor , thyroid nl, carotid no bruit ?Chest - symmetrical excursion , unlabored ?          Heart/CV-  RRR , no murmur , no gallop  , no rub, nl s1 s2 ?                          - JVD- none , edema- none, stasis changes- none, varices- none ?          Lung- clear to P&A, wheeze- none, cough- none , dullness-none, rub- none ?          Chest wall-  ?Abd-  ?Br/ Gen/ Rectal- Not done, not indicated ?Extrem- cyanosis- none, clubbing, none, atrophy- none, strength- nl ?Neuro- grossly intact to observation ? ? ?

## 2022-01-17 ENCOUNTER — Ambulatory Visit: Payer: Medicare HMO | Admitting: Internal Medicine

## 2022-01-17 ENCOUNTER — Encounter: Payer: Self-pay | Admitting: Internal Medicine

## 2022-01-17 DIAGNOSIS — G4733 Obstructive sleep apnea (adult) (pediatric): Secondary | ICD-10-CM | POA: Diagnosis not present

## 2022-01-17 DIAGNOSIS — I2510 Atherosclerotic heart disease of native coronary artery without angina pectoris: Secondary | ICD-10-CM | POA: Diagnosis not present

## 2022-01-17 NOTE — Patient Instructions (Signed)
We can continue CPAP auto 4-15 ? ?Please call if we can help ?

## 2022-01-25 DIAGNOSIS — G4733 Obstructive sleep apnea (adult) (pediatric): Secondary | ICD-10-CM | POA: Diagnosis not present

## 2022-01-28 DIAGNOSIS — G4733 Obstructive sleep apnea (adult) (pediatric): Secondary | ICD-10-CM | POA: Diagnosis not present

## 2022-02-10 ENCOUNTER — Encounter: Payer: Self-pay | Admitting: Internal Medicine

## 2022-02-10 NOTE — Assessment & Plan Note (Signed)
Benefits from CPAP with good compliance and control. ?Plan-continue autopap 4-`15 ?

## 2022-02-10 NOTE — Assessment & Plan Note (Signed)
Maintaining good sleep habit and regular exercise. ?Plan- follow with cardiology ?

## 2022-02-12 DIAGNOSIS — H1132 Conjunctival hemorrhage, left eye: Secondary | ICD-10-CM | POA: Diagnosis not present

## 2022-02-12 DIAGNOSIS — H0100A Unspecified blepharitis right eye, upper and lower eyelids: Secondary | ICD-10-CM | POA: Diagnosis not present

## 2022-02-12 DIAGNOSIS — H0100B Unspecified blepharitis left eye, upper and lower eyelids: Secondary | ICD-10-CM | POA: Diagnosis not present

## 2022-02-12 DIAGNOSIS — H02839 Dermatochalasis of unspecified eye, unspecified eyelid: Secondary | ICD-10-CM | POA: Diagnosis not present

## 2022-02-12 DIAGNOSIS — H2511 Age-related nuclear cataract, right eye: Secondary | ICD-10-CM | POA: Diagnosis not present

## 2022-02-24 DIAGNOSIS — G4733 Obstructive sleep apnea (adult) (pediatric): Secondary | ICD-10-CM | POA: Diagnosis not present

## 2022-02-28 DIAGNOSIS — G4733 Obstructive sleep apnea (adult) (pediatric): Secondary | ICD-10-CM | POA: Diagnosis not present

## 2022-03-06 ENCOUNTER — Encounter: Payer: Medicare HMO | Admitting: Internal Medicine

## 2022-03-15 DIAGNOSIS — I340 Nonrheumatic mitral (valve) insufficiency: Secondary | ICD-10-CM | POA: Diagnosis not present

## 2022-03-15 DIAGNOSIS — I2510 Atherosclerotic heart disease of native coronary artery without angina pectoris: Secondary | ICD-10-CM | POA: Diagnosis not present

## 2022-03-15 DIAGNOSIS — Z87891 Personal history of nicotine dependence: Secondary | ICD-10-CM | POA: Diagnosis not present

## 2022-03-15 DIAGNOSIS — E785 Hyperlipidemia, unspecified: Secondary | ICD-10-CM | POA: Diagnosis not present

## 2022-03-15 DIAGNOSIS — I739 Peripheral vascular disease, unspecified: Secondary | ICD-10-CM | POA: Diagnosis not present

## 2022-03-15 DIAGNOSIS — I272 Pulmonary hypertension, unspecified: Secondary | ICD-10-CM | POA: Diagnosis not present

## 2022-03-15 DIAGNOSIS — I10 Essential (primary) hypertension: Secondary | ICD-10-CM | POA: Diagnosis not present

## 2022-03-15 DIAGNOSIS — I341 Nonrheumatic mitral (valve) prolapse: Secondary | ICD-10-CM | POA: Diagnosis not present

## 2022-03-15 DIAGNOSIS — Z7902 Long term (current) use of antithrombotics/antiplatelets: Secondary | ICD-10-CM | POA: Diagnosis not present

## 2022-03-16 ENCOUNTER — Other Ambulatory Visit: Payer: Self-pay | Admitting: Adult Health

## 2022-03-21 DIAGNOSIS — I2510 Atherosclerotic heart disease of native coronary artery without angina pectoris: Secondary | ICD-10-CM | POA: Diagnosis not present

## 2022-03-30 DIAGNOSIS — G4733 Obstructive sleep apnea (adult) (pediatric): Secondary | ICD-10-CM | POA: Diagnosis not present

## 2022-04-14 ENCOUNTER — Encounter: Payer: Self-pay | Admitting: Internal Medicine

## 2022-04-14 MED ORDER — ATORVASTATIN CALCIUM 80 MG PO TABS: ORAL_TABLET | ORAL | 1 refills | Status: DC

## 2022-04-14 NOTE — Progress Notes (Unsigned)
Annual  Screening/Preventative Visit  & Comprehensive Evaluation & Examination  Future Appointments  Date Time Provider Department  04/15/2022                 cpe  3:00 PM Unk Pinto, MD [facility]  06/06/2022                  wellness  3:30 PM Darrol Jump, NP [facility]  01/20/2023  3:30 PM Deneise Lever, MD [facility]  04/17/2023                cpe  3:00 PM Unk Pinto, MD [facility]              This very nice 71 y.o.  MBM presents for a Screening /Preventative Visit & comprehensive evaluation and management of multiple medical co-morbidities.  Patient has been followed for  HTN, ASCAD, HLD, Pre-Diabetes and Vitamin D Deficiency . Patient is on CPAP for OSA.        HTN predates since 2016 .  Patient's BP has been controlled at home.  Today's BP is at goal - 116/72 .  Patient underwent PCA in  2016 w/DES implanted.  Then in Aug 2021, he had a 2sd DES implanted.  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  Patient has had no Angina since  his cardiac procedures . He also had hx/o systolic CHF which stabilized post recovery.  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 He continues  f/u for his ASPVD. Patient still follows with Dr Marisa Severin at [facility].  Patient denies any cardiac symptoms as chest pain, palpitations, shortness of breath, dizziness or ankle swelling. Patient also has hx/ o Vitamin B12 Deficiency.       Patient's last lipids are controlled with diet /Atorvastatin. Patient denies myalgias or other medication SE's. Last lipids were at goal :  Lab Results  Component Value Date   CHOL 106 12/27/2021   HDL 40 12/27/2021   LDLCALC 46 12/27/2021   TRIG 115 12/27/2021   CHOLHDL 2.7 12/27/2021        Patient has hx/o prediabetes (2021) and patient denies reactive hypoglycemic symptoms, visual blurring, diabetic polys or paresthesias. Last A1c was not at goal :   Lab Results  Component Value Date   HGBA1C 6.3 (H) 12/27/2021         Finally, patient has history of Vitamin D Deficiency and last vitamin D was at goal :   Lab Results  Component Value Date   VD25OH 80 03/05/2021      Current Meds :    VITAMIN C 1000 MG tablet, Take 1 tablet daily   Bempedoic Acid-Ezetimibe (NEXLIZET) 180-10 MG TABS, Take  1 tablet  Daily     VITAMIN D  5000 u Take 1 tablet daily., Disp:    clopidogrel (PLAVIX) 75 MG tablet, Take   1 tablet   Daily       metoprolol succinate-XL  25 MG , Take 1 tablet  daily   pantoprazole 40 MG tablet, Take  1 tablet  Daily   nitroGLYCERIN 0.4 MG SL tablet,  as needed for chest pain   Allergies  Allergen Reactions   Coreg [Carvedilol] Hives and Itching     Past Medical History:  Diagnosis Date   Hyperlipidemia    Hypertension 2010   Myocardial infarction [facility]) 2015   Prediabetes 2015     Health Maintenance  Topic Date Due  Pneumococcal Vaccine 57-74 Years old (1 of 4 - PCV13) Never done   COLONOSCOPY Never done   Zoster Vaccines- Shingrix (1 of 2) Never done   INFLUENZA VACCINE  04/30/2021   TETANUS/TDAP  09/30/2024   COVID-19 Vaccine  Completed   Hepatitis C Screening  Completed   HPV VACCINES  Aged Out   PNA vac Low Risk Adult  Discontinued     Immunization History  Administered Date(s) Administered   PFIZER SARS-COV-2 Vaccination 11/04/2019, 11/25/2019, 07/09/2020   Tetanus 09/30/2014    Last Colon - Alleges he had a Colonoscopy 4-5 years ago.    Past Surgical History:  Procedure Laterality Date   CORONARY STENT INTERVENTION N/A 05/05/2020   Procedure: CORONARY STENT INTERVENTION;  Surgeon: Troy Sine, MD;  Location: [place] [facility];  Service: Cardiovascular;  Laterality: N/A;   CORONARY/GRAFT ACUTE MI REVASCULARIZATION N/A 05/05/2020   Procedure: Coronary/Graft Acute MI Revascularization;  Surgeon: Troy Sine, MD;  Location: [place] [facility];  Service: Cardiovascular;  Laterality: N/A;   LEFT HEART CATH AND CORONARY ANGIOGRAPHY N/A 05/05/2020    Procedure: LEFT HEART CATH AND CORONARY ANGIOGRAPHY;  Surgeon: Troy Sine, MD;  Location: [place] [facility];  Service: Cardiovascular;  Laterality: N/A;     Family History  Problem Relation Age of Onset   Cancer Mother    Hypertension Father    Heart disease Father    Diabetes Sister    Stroke Brother    COPD Brother    Hypertension Brother    COPD Brother     Social History   Socioeconomic History   Marital status: Married    Spouse name: Judson Roch   Number of children: 1  Occupational History   Retired  Tobacco Use   Smoking status: Former Smoker   Smokeless tobacco: Never Used  Scientific laboratory technician Use: Never used  Substance and Sexual Activity   Alcohol use: Not Currently   Drug use: Not on file   Sexual activity: Not on file     ROS Constitutional: Denies fever, chills, weight loss/gain, headaches, insomnia,  night sweats or change in appetite. Does c/o fatigue. Eyes: Denies redness, blurred vision, diplopia, discharge, itchy or watery eyes.  ENT: Denies discharge, congestion, post nasal drip, epistaxis, sore throat, earache, hearing loss, dental pain, Tinnitus, Vertigo, Sinus pain or snoring.  Cardio: Denies chest pain, palpitations, irregular heartbeat, syncope, dyspnea, diaphoresis, orthopnea, PND, claudication or edema Respiratory: denies cough, dyspnea, DOE, pleurisy, hoarseness, laryngitis or wheezing.  Gastrointestinal: Denies dysphagia, heartburn, reflux, water brash, pain, cramps, nausea, vomiting, bloating, diarrhea, constipation, hematemesis, melena, hematochezia, jaundice or hemorrhoids Genitourinary: Denies dysuria, frequency, urgency, nocturia, hesitancy, discharge, hematuria or flank pain Musculoskeletal: Denies arthralgia, myalgia, stiffness, Jt. Swelling, pain, limp or strain/sprain. Denies Falls. Skin: Denies puritis, rash, hives, warts, acne, eczema or change in skin lesion Neuro: No weakness, tremor, incoordination, spasms, paresthesia or  pain Psychiatric: Denies confusion, memory loss or sensory loss. Denies Depression. Endocrine: Denies change in weight, skin, hair change, nocturia, and paresthesia, diabetic polys, visual blurring or hyper / hypo glycemic episodes.  Heme/Lymph: No excessive bleeding, bruising or enlarged lymph nodes.   Physical Exam  BP 116/72   Pulse 86   Temp 97.8 F (36.6 C)   Resp 16   Ht '5\' 9"'$  (1.753 m)   Wt 160 lb 9.6 oz (72.8 kg)   SpO2 98%   BMI 23.72 kg/m   General Appearance: Well nourished and well groomed and in no  apparent distress.  Eyes: PERRLA, EOMs, conjunctiva no swelling or erythema, normal fundi and vessels. Sinuses: No frontal/maxillary tenderness ENT/Mouth: EACs patent / TMs  nl. Nares clear without erythema, swelling, mucoid exudates. Oral hygiene is good. No erythema, swelling, or exudate. Tongue normal, non-obstructing. Tonsils not swollen or erythematous. Hearing normal.  Neck: Supple, thyroid not palpable. No bruits, nodes or JVD. Respiratory: Respiratory effort normal.  BS equal and clear bilateral without rales, rhonci, wheezing or stridor. Cardio: Heart sounds are normal with regular rate and rhythm and no murmurs, rubs or gallops. Peripheral pulses are 1+/1+ without edema. No aortic or femoral bruits. Chest: symmetric with normal excursions and percussion.  Abdomen: Soft, with Nl bowel sounds. Nontender, no guarding, rebound, hernias, masses, or organomegaly.  Lymphatics: Non tender without lymphadenopathy.  Musculoskeletal: Full ROM all peripheral extremities, joint stability, 5/5 strength, and normal gait. Skin: Warm and dry without rashes, lesions, cyanosis, clubbing or  ecchymosis.  Neuro: Cranial nerves intact, reflexes equal bilaterally. Normal muscle tone, no cerebellar symptoms. Sensation intact.  Pysch: Alert and oriented X 3 with normal affect, insight and judgment appropriate.   Assessment and Plan  1. Annual Preventative/Screening Exam    2.  Essential hypertension  - EKG 12-Lead - [place], RETROPERITNL ABD,  LTD - Urinalysis, Routine w reflex microscopic - Microalbumin / creatinine urine ratio - CBC with Differential/Platelet - COMPLETE METABOLIC PANEL WITH GFR - Magnesium - TSH  3. Hyperlipidemia, mixed  - EKG 12-Lead - [place], RETROPERITNL ABD,  LTD - Lipid panel - TSH  4. Abnormal glucose  - EKG 12-Lead - [place], RETROPERITNL ABD,  LTD - Hemoglobin A1c - Insulin, random  5. Vitamin D deficiency  - VITAMIN D 25 Hydroxy   6. Coronary artery disease involving native  coronary artery of native heart without angina pectoris  - EKG 12-Lead - Lipid panel  7. PAD (peripheral artery disease) (HCC)  - [place], RETROPERITNL ABD,  LTD - Lipid panel  8. Stage 3a chronic kidney disease (HCC)  - EKG 12-Lead - [place], RETROPERITNL ABD,  LTD - Urinalysis, Routine w reflex microscopic - Microalbumin / creatinine urine ratio - PTH, intact and calcium - COMPLETE METABOLIC PANEL WITH GFR  9. OSA on CPAP   10. BPH with obstruction/lower urinary tract symptoms  - PSA  11. Vitamin B12 deficiency  - Vitamin B12 - CBC with Differential/Platelet  12. Prostate cancer screening  - PSA  13. Screening for colorectal cancer  - POC Hemoccult Bld/Stl   14. Screening for heart disease  - EKG 12-Lead  15. FHx: heart disease  - EKG 12-Lead - [place], RETROPERITNL ABD,  LTD  16. Former smoker  - EKG 12-Lead - [place], RETROPERITNL ABD,  LTD  17. Screening for AAA (aortic abdominal aneurysm)  - [place], RETROPERITNL ABD,  LTD  18. Medication management  - Urinalysis, Routine w reflex microscopic - Microalbumin / creatinine urine ratio - CBC with Differential/Platelet - COMPLETE METABOLIC PANEL WITH GFR - Magnesium - Lipid panel - TSH - Hemoglobin A1c - Insulin, random - VITAMIN D 25 Hydroxy          Patient was counseled in prudent diet, weight control to achieve/maintain BMI less than 25, BP monitoring, regular exercise and  medications as discussed.  Discussed med effects and SE's. Routine screening labs and tests as requested with regular follow-up as recommended. Over 40 minutes of exam, counseling, chart review and high complex critical decision making was performed   Kirtland Bouchard, MD

## 2022-04-14 NOTE — Patient Instructions (Signed)

## 2022-04-15 ENCOUNTER — Encounter: Payer: Self-pay | Admitting: Internal Medicine

## 2022-04-15 ENCOUNTER — Ambulatory Visit (INDEPENDENT_AMBULATORY_CARE_PROVIDER_SITE_OTHER): Payer: Medicare HMO | Admitting: Internal Medicine

## 2022-04-15 VITALS — BP 116/72 | HR 86 | Temp 97.8°F | Resp 16 | Ht 69.0 in | Wt 160.6 lb

## 2022-04-15 DIAGNOSIS — R7309 Other abnormal glucose: Secondary | ICD-10-CM

## 2022-04-15 DIAGNOSIS — E782 Mixed hyperlipidemia: Secondary | ICD-10-CM

## 2022-04-15 DIAGNOSIS — I2510 Atherosclerotic heart disease of native coronary artery without angina pectoris: Secondary | ICD-10-CM

## 2022-04-15 DIAGNOSIS — Z79899 Other long term (current) drug therapy: Secondary | ICD-10-CM | POA: Diagnosis not present

## 2022-04-15 DIAGNOSIS — E559 Vitamin D deficiency, unspecified: Secondary | ICD-10-CM | POA: Diagnosis not present

## 2022-04-15 DIAGNOSIS — I10 Essential (primary) hypertension: Secondary | ICD-10-CM | POA: Diagnosis not present

## 2022-04-15 DIAGNOSIS — I700 Atherosclerosis of aorta: Secondary | ICD-10-CM

## 2022-04-15 DIAGNOSIS — Z0000 Encounter for general adult medical examination without abnormal findings: Secondary | ICD-10-CM

## 2022-04-15 DIAGNOSIS — Z87891 Personal history of nicotine dependence: Secondary | ICD-10-CM

## 2022-04-15 DIAGNOSIS — E538 Deficiency of other specified B group vitamins: Secondary | ICD-10-CM

## 2022-04-15 DIAGNOSIS — N1831 Chronic kidney disease, stage 3a: Secondary | ICD-10-CM

## 2022-04-15 DIAGNOSIS — N401 Enlarged prostate with lower urinary tract symptoms: Secondary | ICD-10-CM | POA: Diagnosis not present

## 2022-04-15 DIAGNOSIS — N138 Other obstructive and reflux uropathy: Secondary | ICD-10-CM

## 2022-04-15 DIAGNOSIS — Z1212 Encounter for screening for malignant neoplasm of rectum: Secondary | ICD-10-CM

## 2022-04-15 DIAGNOSIS — I739 Peripheral vascular disease, unspecified: Secondary | ICD-10-CM

## 2022-04-15 DIAGNOSIS — G4733 Obstructive sleep apnea (adult) (pediatric): Secondary | ICD-10-CM

## 2022-04-15 DIAGNOSIS — Z125 Encounter for screening for malignant neoplasm of prostate: Secondary | ICD-10-CM | POA: Diagnosis not present

## 2022-04-15 DIAGNOSIS — Z0001 Encounter for general adult medical examination with abnormal findings: Secondary | ICD-10-CM

## 2022-04-15 DIAGNOSIS — Z8249 Family history of ischemic heart disease and other diseases of the circulatory system: Secondary | ICD-10-CM

## 2022-04-15 DIAGNOSIS — Z136 Encounter for screening for cardiovascular disorders: Secondary | ICD-10-CM

## 2022-04-15 MED ORDER — NEXLIZET 180-10 MG PO TABS: ORAL_TABLET | ORAL | 0 refills | Status: DC

## 2022-04-16 LAB — COMPLETE METABOLIC PANEL WITH GFR
AG Ratio: 2 (calc) (ref 1.0–2.5)
ALT: 39 U/L (ref 9–46)
AST: 28 U/L (ref 10–35)
Albumin: 4.3 g/dL (ref 3.6–5.1)
Alkaline phosphatase (APISO): 108 U/L (ref 35–144)
BUN: 16 mg/dL (ref 7–25)
CO2: 27 mmol/L (ref 20–32)
Calcium: 9.4 mg/dL (ref 8.6–10.3)
Chloride: 106 mmol/L (ref 98–110)
Creat: 1 mg/dL (ref 0.70–1.35)
Globulin: 2.2 g/dL (calc) (ref 1.9–3.7)
Glucose, Bld: 99 mg/dL (ref 65–99)
Potassium: 4.1 mmol/L (ref 3.5–5.3)
Sodium: 141 mmol/L (ref 135–146)
Total Bilirubin: 0.5 mg/dL (ref 0.2–1.2)
Total Protein: 6.5 g/dL (ref 6.1–8.1)
eGFR: 82 mL/min/{1.73_m2} (ref >=60)

## 2022-04-16 LAB — CBC WITH DIFFERENTIAL/PLATELET
Absolute Monocytes: 480 cells/uL (ref 200–950)
Basophils Absolute: 31 cells/uL (ref 0–200)
Basophils Relative: 0.8 %
Eosinophils Absolute: 78 cells/uL (ref 15–500)
Eosinophils Relative: 2 %
HCT: 42.4 % (ref 38.5–50.0)
Hemoglobin: 13.9 g/dL (ref 13.2–17.1)
Lymphs Abs: 1377 cells/uL (ref 850–3900)
MCH: 30.2 pg (ref 27.0–33.0)
MCHC: 32.8 g/dL (ref 32.0–36.0)
MCV: 92.2 fL (ref 80.0–100.0)
MPV: 11.1 fL (ref 7.5–12.5)
Monocytes Relative: 12.3 %
Neutro Abs: 1934 cells/uL (ref 1500–7800)
Neutrophils Relative %: 49.6 %
Platelets: 186 10*3/uL (ref 140–400)
RBC: 4.6 10*6/uL (ref 4.20–5.80)
RDW: 12.7 % (ref 11.0–15.0)
Total Lymphocyte: 35.3 %
WBC: 3.9 10*3/uL (ref 3.8–10.8)

## 2022-04-16 LAB — HEMOGLOBIN A1C
Hgb A1c MFr Bld: 5.9 % of total Hgb — ABNORMAL HIGH (ref <5.7)
Mean Plasma Glucose: 123 mg/dL
eAG (mmol/L): 6.8 mmol/L

## 2022-04-16 LAB — LIPID PANEL
Cholesterol: 101 mg/dL (ref <200)
HDL: 41 mg/dL (ref >=40)
LDL Cholesterol (Calc): 36 mg/dL (calc)
Non-HDL Cholesterol (Calc): 60 mg/dL (calc) (ref <130)
Total CHOL/HDL Ratio: 2.5 (calc) (ref <5.0)
Triglycerides: 164 mg/dL — ABNORMAL HIGH (ref <150)

## 2022-04-16 LAB — URINALYSIS, ROUTINE W REFLEX MICROSCOPIC
Bilirubin Urine: NEGATIVE
Glucose, UA: NEGATIVE
Hgb urine dipstick: NEGATIVE
Ketones, ur: NEGATIVE
Leukocytes,Ua: NEGATIVE
Nitrite: NEGATIVE
Protein, ur: NEGATIVE
Specific Gravity, Urine: 1.018 (ref 1.001–1.035)
pH: <=5 (ref 5.0–8.0)

## 2022-04-16 LAB — VITAMIN D 25 HYDROXY (VIT D DEFICIENCY, FRACTURES): Vit D, 25-Hydroxy: 79 ng/mL (ref 30–100)

## 2022-04-16 LAB — MICROALBUMIN / CREATININE URINE RATIO
Creatinine, Urine: 123 mg/dL (ref 20–320)
Microalb, Ur: <0.2 mg/dL

## 2022-04-16 LAB — MAGNESIUM: Magnesium: 1.9 mg/dL (ref 1.5–2.5)

## 2022-04-16 LAB — PTH, INTACT AND CALCIUM
Calcium: 9.4 mg/dL (ref 8.6–10.3)
PTH: 34 pg/mL (ref 16–77)

## 2022-04-16 LAB — VITAMIN B12: Vitamin B-12: >2000 pg/mL — ABNORMAL HIGH (ref 200–1100)

## 2022-04-16 LAB — TSH: TSH: 1.46 mIU/L (ref 0.40–4.50)

## 2022-04-16 LAB — INSULIN, RANDOM: Insulin: 37.2 u[IU]/mL — ABNORMAL HIGH

## 2022-04-16 LAB — PSA: PSA: 0.72 ng/mL (ref <=4.00)

## 2022-04-16 NOTE — Progress Notes (Signed)
<><><><><><><><><><><><><><><><><><><><><><><><><><><><><><><><><> <><><><><><><><><><><><><><><><><><><><><><><><><><><><><><><><><>  -   Vitamin B12 is extremely High                      - So if taking a B12 supplement daily,                                              please decrease to only take 1 x /week <><><><><><><><><><><><><><><><><><><><><><><><><><><><><><><><><>  - Total Chol = 101  & LDL Chol = 36  - Both  Excellent   - Very low risk for Heart Attack  / Stroke <><><><><><><><><><><><><><><><><><><><><><><><><><><><><><><><><>  -  A1c Better - Down from   6.3% to now 5.9%    - Great  <><><><><><><><><><><><><><><><><><><><><><><><><><><><><><><><><>  -  PSA - very Low - Great ! <><><><><><><><><><><><><><><><><><><><><><><><><><><><><><><><><>  -  PTH hormone that regulates calcium balance is Normal - Great ! <><><><><><><><><><><><><><><><><><><><><><><><><><><><><><><><><>  -  Vitamin D = 79  - Excellent - Please keep dose same  <><><><><><><><><><><><><><><><><><><><><><><><><><><><><><><><><>  -  All Else - CBC - Kidneys - Electrolytes - Liver - Magnesium & Thyroid    - all  Normal / OK <><><><><><><><><><><><><><><><><><><><><><><><><><><><><><><><><><> <><><><><><><><><><><><><><><><><><><><><><><><><><><><><><><><><><>  -  Keep up the [place] Work  !  <><><><><><><><><><><><><><><><><><><><><><><><><><><><><><><><><><> <><><><><><><><><><><><><><><><><><><><><><><><><><><><><><><><><><>

## 2022-04-25 NOTE — Progress Notes (Signed)
Future Appointments  Date Time Provider Department  04/26/2022 11:00 AM Unk Pinto, MD [facility]  07/19/2022  9:30 AM Darrol Jump, NP [facility]  10/25/2022  9:30 AM Unk Pinto, MD [facility]  01/20/2023  3:30 PM Deneise Lever, MD [facility]  04/17/2023  3:00 PM Unk Pinto, MD [facility]    History of Present Illness:     Patient is a very nice 71 yo MBM with HTN who presents for recheck. Hypertensive systems review is negative. Patient is also concerned re: a lump of his lateral Left chest wall.     Medications   Current Outpatient Medications (Cardiovascular):    Bempedoic Acid-Ezetimibe (NEXLIZET) 180-10 MG TABS, Take  1 tablet  Daily  for Cholesterol   metoprolol succinate (TOPROL-XL) 25 MG 24 hr tablet, Take 1 tablet (25 mg total) by mouth daily.   nitroGLYCERIN (NITROSTAT) 0.4 MG SL tablet, Place 1 tablet (0.4 mg total) under the tongue every 5 (five) minutes as needed for chest pain.    Current Outpatient Medications (Hematological):    clopidogrel (PLAVIX) 75 MG tablet, Take   1 tablet   Daily     to Prevent Blood Clots  Current Outpatient Medications (Other):    ascorbic acid (VITAMIN C) 1000 MG tablet, Take 1 tablet by mouth daily.   Cholecalciferol (VITAMIN D-3) 125 MCG (5000 UT) TABS, Take 1 tablet by mouth daily.   pantoprazole (PROTONIX) 40 MG tablet, Take  1 tablet  Daily  to Prevent Indigestion & Heartburn  Problem list He has Hypertension; Hyperlipidemia; Vitamin D deficiency; Former smoker; CAD (coronary artery disease); PAD (peripheral artery disease) ([place]); Chronic heart failure ([place]); OSA (obstructive sleep apnea); Elevated CK; Myalgia; BMI 23.0-23.9, adult; and Anemia on their problem list.   Observations/Objective:  BP 114/70   Pulse 67   Temp 97.9 F (36.6 C)   Resp 17   Ht '5\' 9"'$  (1.753 m)   Wt 159 lb 3.2 oz (72.2 kg)   SpO2 98%   BMI 23.51 kg/m   Skin - There is a 3.3 x 3.6 non-tender firm subcutaneous mobile mass  of the left anterolateral chest wall.  Procedure  ( CPT  -   93810)      After informed consent and aseptic prep with alcohol the area over the mass was infiltrated with 4 ml of Marcaine 0.5% sub-cutaneous. Then with a # 10 scalpel, a 2 cm transverse incision  was made thru the skin  & thru the subcutaneous tissue to reveal a scarified sebaceous cyst which was sharply excised & removed in toto. Then the wound cavity was collapsed and wound edges aligned and everted with # 4 wide vertical mattress sutures with Nylon 3-0 to secure the wound edges in alignment. The the wound edges were further secured with a running lock stitch with # 5 sutures with Nylon 3-0. Then Antibiotic ung was applied & covered with a sterile 2 " guaze and then covered with a 8"  x  8" Tegaderm bandage.  Patient was instructed in post-op wound care & 2 return in 2 weeks for suture removal.    Assessment and Plan:   1. Essential hypertension  2. Sebaceous cyst   Follow Up Instructions:        I discussed the assessment and treatment plan with the patient. The patient was provided an opportunity to ask questions and all were answered. The patient agreed with the plan and demonstrated an understanding of the instructions.  The patient was advised to call back or seek an in-person evaluation if the symptoms worsen or if the condition fails to improve as anticipated.    Kirtland Bouchard, MD

## 2022-04-25 NOTE — Progress Notes (Signed)
    Future Appointments  Date Time Provider Department  04/26/2022 11:00 AM Unk Pinto, MD [facility]  07/19/2022  9:30 AM Darrol Jump, NP [facility]  10/25/2022  9:30 AM Unk Pinto, MD [facility]  01/20/2023  3:30 PM Deneise Lever, MD [facility]  04/17/2023  3:00 PM Unk Pinto, MD [facility]    History of Present Illness:      Medications   Current Outpatient Medications (Cardiovascular):    Bempedoic Acid-Ezetimibe (NEXLIZET) 180-10 MG TABS, Take  1 tablet  Daily  for Cholesterol   metoprolol succinate (TOPROL-XL) 25 MG 24 hr tablet, Take 1 tablet (25 mg total) by mouth daily.   nitroGLYCERIN (NITROSTAT) 0.4 MG SL tablet, Place 1 tablet (0.4 mg total) under the tongue every 5 (five) minutes as needed for chest pain.    Current Outpatient Medications (Hematological):    clopidogrel (PLAVIX) 75 MG tablet, Take   1 tablet   Daily     to Prevent Blood Clots  Current Outpatient Medications (Other):    ascorbic acid (VITAMIN C) 1000 MG tablet, Take 1 tablet by mouth daily.   Cholecalciferol (VITAMIN D-3) 125 MCG (5000 UT) TABS, Take 1 tablet by mouth daily.   pantoprazole (PROTONIX) 40 MG tablet, Take  1 tablet  Daily  to Prevent Indigestion & Heartburn  Problem list He has Hypertension; Hyperlipidemia; Vitamin D deficiency; Former smoker; CAD (coronary artery disease); PAD (peripheral artery disease) ([place]); Chronic heart failure ([place]); OSA (obstructive sleep apnea); Elevated CK; Myalgia; BMI 23.0-23.9, adult; and Anemia on their problem list.   Observations/Objective:  There were no vitals taken for this visit.  HEENT - WNL. Neck - supple.  Chest - Clear equal BS. Cor - Nl HS. RRR w/o sig MGR. PP 1(+). No edema. MS- FROM w/o deformities.  Gait Nl. Neuro -  Nl w/o focal abnormalities.   Assessment and Plan:      Follow Up Instructions:        I discussed the assessment and treatment plan with the patient. The patient was provided an  opportunity to ask questions and all were answered. The patient agreed with the plan and demonstrated an understanding of the instructions.       The patient was advised to call back or seek an in-person evaluation if the symptoms worsen or if the condition fails to improve as anticipated.    Kirtland Bouchard, MD

## 2022-04-26 ENCOUNTER — Encounter: Payer: Self-pay | Admitting: Internal Medicine

## 2022-04-26 ENCOUNTER — Ambulatory Visit (INDEPENDENT_AMBULATORY_CARE_PROVIDER_SITE_OTHER): Payer: Medicare HMO | Admitting: Internal Medicine

## 2022-04-26 VITALS — BP 114/70 | HR 67 | Temp 97.9°F | Resp 17 | Ht 69.0 in | Wt 159.2 lb

## 2022-04-26 DIAGNOSIS — L723 Sebaceous cyst: Secondary | ICD-10-CM

## 2022-04-26 DIAGNOSIS — I10 Essential (primary) hypertension: Secondary | ICD-10-CM | POA: Diagnosis not present

## 2022-04-29 ENCOUNTER — Ambulatory Visit: Payer: Medicare HMO | Admitting: Internal Medicine

## 2022-04-29 ENCOUNTER — Encounter: Payer: Self-pay | Admitting: Internal Medicine

## 2022-04-29 VITALS — BP 118/70 | HR 60 | Temp 98.0°F | Resp 16 | Ht 69.0 in | Wt 163.8 lb

## 2022-04-29 DIAGNOSIS — G4733 Obstructive sleep apnea (adult) (pediatric): Secondary | ICD-10-CM | POA: Diagnosis not present

## 2022-04-29 DIAGNOSIS — L723 Sebaceous cyst: Secondary | ICD-10-CM

## 2022-04-29 NOTE — Progress Notes (Signed)
    Future Appointments  Date Time Provider Department  04/29/2022  4:30 PM Unk Pinto, MD [facility]  05/10/2022 10:30 AM Unk Pinto, MD [facility]  07/19/2022  9:30 AM Darrol Jump, NP [facility]  10/25/2022  9:30 AM Unk Pinto, MD [facility]  01/20/2023  3:30 PM Deneise Lever, MD [facility]  04/17/2023  3:00 PM Unk Pinto, MD [facility]   History of Present Illness:            Patient is a very nice 71 yo MBM with HTN  who returns for recheck  s/p excision of a large sebaceous cyst of the lt lat ant chest wall  3 days ago on 07/28 /23.  Medications   Current Outpatient Medications (Cardiovascular):    Bempedoic Acid-Ezetimibe (NEXLIZET) 180-10 MG TABS, Take  1 tablet  Daily  for Cholesterol   metoprolol succinate (TOPROL-XL) 25 MG 24 hr tablet, Take 1 tablet (25 mg total) by mouth daily.   nitroGLYCERIN (NITROSTAT) 0.4 MG SL tablet, Place 1 tablet (0.4 mg total) under the tongue every 5 (five) minutes as needed for chest pain.    Current Outpatient Medications (Hematological):    clopidogrel (PLAVIX) 75 MG tablet, Take   1 tablet   Daily     to Prevent Blood Clots  Current Outpatient Medications (Other):    ascorbic acid (VITAMIN C) 1000 MG tablet, Take 1 tablet by mouth daily.   Cholecalciferol (VITAMIN D-3) 125 MCG (5000 UT) TABS, Take 1 tablet by mouth daily.   pantoprazole (PROTONIX) 40 MG tablet, Take  1 tablet  Daily  to Prevent Indigestion & Heartburn  Problem list He has Hypertension; Hyperlipidemia; Vitamin D deficiency; Former smoker; CAD (coronary artery disease); PAD (peripheral artery disease) ([place]); Chronic heart failure ([place]); OSA (obstructive sleep apnea); Elevated CK; Myalgia; BMI 23.0-23.9, adult; and Anemia on their problem list.   Observations/Objective:  BP 118/70   Pulse 60   Temp 98 F (36.7 C)   Resp 16   Ht '5\' 9"'$  (1.753 m)   Wt 163 lb 12.8 oz (74.3 kg)   SpO2 99%   BMI 24.19 kg/m   Bandages changes - good wound  healing w/o signs of infection  Assessment and Plan:  1. Sebaceous cyst   Follow Up Instructions:  ROV ~ 10 days     Kirtland Bouchard, MD

## 2022-05-07 ENCOUNTER — Telehealth: Payer: Self-pay | Admitting: Internal Medicine

## 2022-05-07 DIAGNOSIS — G4733 Obstructive sleep apnea (adult) (pediatric): Secondary | ICD-10-CM

## 2022-05-07 NOTE — Telephone Encounter (Signed)
Order- DME [employer]- refit CPAP mask of choice to reduce leak. Service machine if needed. Provide download.

## 2022-05-07 NOTE — Telephone Encounter (Signed)
Called the pt and there was no answer- LMTCB    

## 2022-05-07 NOTE — Telephone Encounter (Signed)
I called the patient and he reports that he feels his mask is leaking air at night. He has tried to adjust the mask and just feels that he is not getting a good quality sleep at night the last week or so. Per Leroy Sea with [employer] he has a machine that has to be a Psychologist, occupational in the [employer] office. Do you want me to send  in an order to trouble shoot the machine with [employer]? Please advise.

## 2022-05-07 NOTE — Telephone Encounter (Signed)
I called the patient and he is agreeable to the order per Dr. Annamaria Boots. I have placed the order per CY and nothing further is needed.

## 2022-05-10 ENCOUNTER — Encounter: Payer: Self-pay | Admitting: Internal Medicine

## 2022-05-10 ENCOUNTER — Ambulatory Visit: Payer: Medicare HMO | Admitting: Internal Medicine

## 2022-05-10 VITALS — BP 118/70 | HR 63 | Temp 98.1°F | Resp 16 | Ht 69.0 in | Wt 161.0 lb

## 2022-05-10 DIAGNOSIS — L723 Sebaceous cyst: Secondary | ICD-10-CM

## 2022-05-10 NOTE — Progress Notes (Signed)
                                                            Patient is a very nice 71 yo MBM with HTN  who returns for recheck  s/p excision of a large sebaceous cyst of the lt lat ant chest wall  14 days ago on 07/28 /23.    BP 118/70   P 63   T 98.1 F   R 16   Ht '5\' 9"'$     Wt 161 lb    SpO2 99%   BMI 23.78 9  Wound well healed w/o signs of infection. There is some keloid formation.   Sutures removed & Neosporin ung applied & covered with Tegaderm.

## 2022-05-27 DIAGNOSIS — I2510 Atherosclerotic heart disease of native coronary artery without angina pectoris: Secondary | ICD-10-CM | POA: Diagnosis not present

## 2022-05-27 DIAGNOSIS — Z8249 Family history of ischemic heart disease and other diseases of the circulatory system: Secondary | ICD-10-CM | POA: Diagnosis not present

## 2022-05-27 DIAGNOSIS — Z7902 Long term (current) use of antithrombotics/antiplatelets: Secondary | ICD-10-CM | POA: Diagnosis not present

## 2022-05-27 DIAGNOSIS — N529 Male erectile dysfunction, unspecified: Secondary | ICD-10-CM | POA: Diagnosis not present

## 2022-05-27 DIAGNOSIS — G4733 Obstructive sleep apnea (adult) (pediatric): Secondary | ICD-10-CM | POA: Diagnosis not present

## 2022-05-27 DIAGNOSIS — I10 Essential (primary) hypertension: Secondary | ICD-10-CM | POA: Diagnosis not present

## 2022-05-27 DIAGNOSIS — Z803 Family history of malignant neoplasm of breast: Secondary | ICD-10-CM | POA: Diagnosis not present

## 2022-05-27 DIAGNOSIS — I739 Peripheral vascular disease, unspecified: Secondary | ICD-10-CM | POA: Diagnosis not present

## 2022-05-27 DIAGNOSIS — K219 Gastro-esophageal reflux disease without esophagitis: Secondary | ICD-10-CM | POA: Diagnosis not present

## 2022-05-27 DIAGNOSIS — I252 Old myocardial infarction: Secondary | ICD-10-CM | POA: Diagnosis not present

## 2022-05-27 DIAGNOSIS — E785 Hyperlipidemia, unspecified: Secondary | ICD-10-CM | POA: Diagnosis not present

## 2022-05-27 DIAGNOSIS — Z833 Family history of diabetes mellitus: Secondary | ICD-10-CM | POA: Diagnosis not present

## 2022-05-30 DIAGNOSIS — G4733 Obstructive sleep apnea (adult) (pediatric): Secondary | ICD-10-CM | POA: Diagnosis not present

## 2022-06-06 ENCOUNTER — Ambulatory Visit: Payer: Medicare HMO | Admitting: Nurse Practitioner

## 2022-06-29 DIAGNOSIS — G4733 Obstructive sleep apnea (adult) (pediatric): Secondary | ICD-10-CM | POA: Diagnosis not present

## 2022-07-17 ENCOUNTER — Ambulatory Visit: Payer: Self-pay

## 2022-07-17 ENCOUNTER — Other Ambulatory Visit: Payer: Self-pay | Admitting: Nurse Practitioner

## 2022-07-17 DIAGNOSIS — M25512 Pain in left shoulder: Secondary | ICD-10-CM

## 2022-07-19 ENCOUNTER — Ambulatory Visit: Payer: Medicare HMO | Admitting: Nurse Practitioner

## 2022-07-29 DIAGNOSIS — G4733 Obstructive sleep apnea (adult) (pediatric): Secondary | ICD-10-CM | POA: Diagnosis not present

## 2022-08-02 ENCOUNTER — Ambulatory Visit (INDEPENDENT_AMBULATORY_CARE_PROVIDER_SITE_OTHER): Payer: Medicare HMO | Admitting: Nurse Practitioner

## 2022-08-02 ENCOUNTER — Encounter: Payer: Self-pay | Admitting: Nurse Practitioner

## 2022-08-02 VITALS — BP 158/84 | HR 60 | Temp 97.3°F | Ht 69.0 in | Wt 163.0 lb

## 2022-08-02 DIAGNOSIS — R7309 Other abnormal glucose: Secondary | ICD-10-CM

## 2022-08-02 DIAGNOSIS — G4733 Obstructive sleep apnea (adult) (pediatric): Secondary | ICD-10-CM

## 2022-08-02 DIAGNOSIS — Z6823 Body mass index (BMI) 23.0-23.9, adult: Secondary | ICD-10-CM

## 2022-08-02 DIAGNOSIS — Z79899 Other long term (current) drug therapy: Secondary | ICD-10-CM | POA: Diagnosis not present

## 2022-08-02 DIAGNOSIS — I739 Peripheral vascular disease, unspecified: Secondary | ICD-10-CM

## 2022-08-02 DIAGNOSIS — R6889 Other general symptoms and signs: Secondary | ICD-10-CM | POA: Diagnosis not present

## 2022-08-02 DIAGNOSIS — I10 Essential (primary) hypertension: Secondary | ICD-10-CM

## 2022-08-02 DIAGNOSIS — I2510 Atherosclerotic heart disease of native coronary artery without angina pectoris: Secondary | ICD-10-CM

## 2022-08-02 DIAGNOSIS — E782 Mixed hyperlipidemia: Secondary | ICD-10-CM

## 2022-08-02 DIAGNOSIS — Z87891 Personal history of nicotine dependence: Secondary | ICD-10-CM

## 2022-08-02 DIAGNOSIS — L723 Sebaceous cyst: Secondary | ICD-10-CM

## 2022-08-02 DIAGNOSIS — Z0000 Encounter for general adult medical examination without abnormal findings: Secondary | ICD-10-CM

## 2022-08-02 DIAGNOSIS — I509 Heart failure, unspecified: Secondary | ICD-10-CM | POA: Diagnosis not present

## 2022-08-02 DIAGNOSIS — Z0001 Encounter for general adult medical examination with abnormal findings: Secondary | ICD-10-CM | POA: Diagnosis not present

## 2022-08-02 DIAGNOSIS — E559 Vitamin D deficiency, unspecified: Secondary | ICD-10-CM | POA: Diagnosis not present

## 2022-08-02 NOTE — Patient Instructions (Signed)
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
## 2022-08-03 LAB — COMPLETE METABOLIC PANEL WITH GFR
AG Ratio: 1.8 (calc) (ref 1.0–2.5)
ALT: 31 U/L (ref 9–46)
AST: 21 U/L (ref 10–35)
Albumin: 4.2 g/dL (ref 3.6–5.1)
Alkaline phosphatase (APISO): 114 U/L (ref 35–144)
BUN: 16 mg/dL (ref 7–25)
CO2: 28 mmol/L (ref 20–32)
Calcium: 9.3 mg/dL (ref 8.6–10.3)
Chloride: 105 mmol/L (ref 98–110)
Creat: 0.86 mg/dL (ref 0.70–1.35)
Globulin: 2.3 g/dL (calc) (ref 1.9–3.7)
Glucose, Bld: 92 mg/dL (ref 65–99)
Potassium: 4.5 mmol/L (ref 3.5–5.3)
Sodium: 141 mmol/L (ref 135–146)
Total Bilirubin: 0.6 mg/dL (ref 0.2–1.2)
Total Protein: 6.5 g/dL (ref 6.1–8.1)
eGFR: 94 mL/min/{1.73_m2} (ref >=60)

## 2022-08-03 LAB — HEMOGLOBIN A1C
Hgb A1c MFr Bld: 6.4 % of total Hgb — ABNORMAL HIGH (ref <5.7)
Mean Plasma Glucose: 137 mg/dL
eAG (mmol/L): 7.6 mmol/L

## 2022-08-03 LAB — CBC WITH DIFFERENTIAL/PLATELET
Absolute Monocytes: 477 cells/uL (ref 200–950)
Basophils Absolute: 41 cells/uL (ref 0–200)
Basophils Relative: 1.1 %
Eosinophils Absolute: 118 cells/uL (ref 15–500)
Eosinophils Relative: 3.2 %
HCT: 44.4 % (ref 38.5–50.0)
Hemoglobin: 14.8 g/dL (ref 13.2–17.1)
Lymphs Abs: 1447 cells/uL (ref 850–3900)
MCH: 30.1 pg (ref 27.0–33.0)
MCHC: 33.3 g/dL (ref 32.0–36.0)
MCV: 90.4 fL (ref 80.0–100.0)
MPV: 10.8 fL (ref 7.5–12.5)
Monocytes Relative: 12.9 %
Neutro Abs: 1617 cells/uL (ref 1500–7800)
Neutrophils Relative %: 43.7 %
Platelets: 211 10*3/uL (ref 140–400)
RBC: 4.91 10*6/uL (ref 4.20–5.80)
RDW: 12.4 % (ref 11.0–15.0)
Total Lymphocyte: 39.1 %
WBC: 3.7 10*3/uL — ABNORMAL LOW (ref 3.8–10.8)

## 2022-08-03 LAB — LIPID PANEL
Cholesterol: 104 mg/dL (ref <200)
HDL: 43 mg/dL (ref >=40)
LDL Cholesterol (Calc): 47 mg/dL (calc)
Non-HDL Cholesterol (Calc): 61 mg/dL (calc) (ref <130)
Total CHOL/HDL Ratio: 2.4 (calc) (ref <5.0)
Triglycerides: 59 mg/dL (ref <150)

## 2022-08-11 NOTE — Progress Notes (Unsigned)
     Future Appointments  Date Time Provider Department  08/12/2022 11:30 AM Unk Pinto, MD [facility]  11/08/2022  9:30 AM Unk Pinto, MD [facility]  01/20/2023  3:30 PM Deneise Lever, MD [facility]  02/25/2023 11:00 AM Darrol Jump, NP [facility]  04/17/2023  3:00 PM Unk Pinto, MD [facility]    History of Present Illness:                   This very nice 71 y.o.  MBM with  HTN, ASCAD, HLD, Pre-Diabetes, OSA/CPAP and Vitamin D Deficiency who presents  HT systems review is negative.   Medications     Bempedoic Acid-Ezetimibe (NEXLIZET) 180-10 MG TABS, Take  1 tablet  Daily    metoprolol succinate -XL 25 MG  , Take 1 tablet  daily.    NITROSTAT  0.4 MG  as needed for chest pain.   clopidogrel  75 MG tablet, Take   1 tablet   Daily     to Prevent Blood Clots   VITAMIN C  1000 MG tablet, Take 1 tablet  daily.   VITAMIN D 5000 u, Take 1 tablet daily.   pantoprazole  40 MG tablet, Take  1 tablet  Daily   Problem list He has Hypertension; Hyperlipidemia; Vitamin D deficiency; Former smoker; CAD (coronary artery disease); PAD (peripheral artery disease) ([place]); Chronic heart failure ([place]); OSA (obstructive sleep apnea); Elevated CK; Myalgia; BMI 23.0-23.9, adult; and Anemia on their problem list.   Observations/Objective:  There were no vitals taken for this visit.  HEENT - WNL. Neck - supple.  Chest - Clear equal BS. Cor - Nl HS. RRR w/o sig MGR. PP 1(+). No edema. MS- FROM w/o deformities.  Gait Nl. Neuro -  Nl w/o focal abnormalities.  SKIN  -   Procedure  (CPT -                     )      After informed consent and aseptic prep the                        was anesthetized locally with       ml of Marcaine 0.5%.  Then          Assessment and Plan:      Follow Up Instructions:        I discussed the assessment and treatment plan with the patient. The patient was provided an opportunity to ask questions and all were answered. The patient  agreed with the plan and demonstrated an understanding of the instructions.       The patient was advised to call back or seek an in-person evaluation if the symptoms worsen or if the condition fails to improve as anticipated.    Kirtland Bouchard, MD

## 2022-08-12 ENCOUNTER — Ambulatory Visit (INDEPENDENT_AMBULATORY_CARE_PROVIDER_SITE_OTHER): Payer: Medicare HMO | Admitting: Internal Medicine

## 2022-08-12 ENCOUNTER — Encounter: Payer: Self-pay | Admitting: Internal Medicine

## 2022-08-12 VITALS — BP 122/70 | HR 76 | Temp 98.1°F | Resp 16 | Ht 69.0 in | Wt 162.8 lb

## 2022-08-12 DIAGNOSIS — I10 Essential (primary) hypertension: Secondary | ICD-10-CM

## 2022-08-12 DIAGNOSIS — B079 Viral wart, unspecified: Secondary | ICD-10-CM

## 2022-08-12 DIAGNOSIS — L820 Inflamed seborrheic keratosis: Secondary | ICD-10-CM | POA: Diagnosis not present

## 2022-08-12 DIAGNOSIS — M25512 Pain in left shoulder: Secondary | ICD-10-CM | POA: Diagnosis not present

## 2022-08-26 DIAGNOSIS — M25512 Pain in left shoulder: Secondary | ICD-10-CM | POA: Diagnosis not present

## 2022-08-29 DIAGNOSIS — G4733 Obstructive sleep apnea (adult) (pediatric): Secondary | ICD-10-CM | POA: Diagnosis not present

## 2022-09-16 ENCOUNTER — Encounter: Payer: Self-pay | Admitting: Internal Medicine

## 2022-09-28 DIAGNOSIS — G4733 Obstructive sleep apnea (adult) (pediatric): Secondary | ICD-10-CM | POA: Diagnosis not present

## 2022-10-08 ENCOUNTER — Other Ambulatory Visit: Payer: Self-pay

## 2022-10-08 ENCOUNTER — Encounter: Payer: Self-pay | Admitting: Internal Medicine

## 2022-10-08 ENCOUNTER — Ambulatory Visit (INDEPENDENT_AMBULATORY_CARE_PROVIDER_SITE_OTHER): Payer: Medicare HMO | Admitting: Nurse Practitioner

## 2022-10-08 VITALS — BP 138/78 | HR 64 | Temp 97.7°F | Ht 69.0 in | Wt 168.2 lb

## 2022-10-08 DIAGNOSIS — R6889 Other general symptoms and signs: Secondary | ICD-10-CM

## 2022-10-08 DIAGNOSIS — Z1152 Encounter for screening for COVID-19: Secondary | ICD-10-CM | POA: Diagnosis not present

## 2022-10-08 DIAGNOSIS — J40 Bronchitis, not specified as acute or chronic: Secondary | ICD-10-CM | POA: Diagnosis not present

## 2022-10-08 LAB — POCT INFLUENZA A/B
Influenza A, POC: NEGATIVE
Influenza B, POC: NEGATIVE

## 2022-10-08 LAB — POC COVID19 BINAXNOW: SARS Coronavirus 2 Ag: NEGATIVE

## 2022-10-08 MED ORDER — AZITHROMYCIN 250 MG PO TABS: ORAL_TABLET | ORAL | 1 refills | Status: DC

## 2022-10-08 MED ORDER — PROMETHAZINE-DM 6.25-15 MG/5ML PO SYRP: 5.0000 mL | ORAL_SOLUTION | Freq: Four times a day (QID) | ORAL | 1 refills | Status: DC | PRN

## 2022-10-08 MED ORDER — PREDNISONE 20 MG PO TABS: ORAL_TABLET | ORAL | 0 refills | Status: AC

## 2022-10-08 NOTE — Progress Notes (Deleted)
      Future Appointments  Date Time Provider Department  10/08/2022  3:30 PM Unk Pinto, MD [facility]  11/08/2022                        6 mo ov 10:30 AM Unk Pinto, MD [facility]  01/20/2023  3:30 PM Deneise Lever, MD [facility]  02/25/2023                      wellness 11:00 AM Darrol Jump, NP [facility]  04/17/2023                        cpe  3:00 PM Unk Pinto, MD [facility]    History of Present Illness:      This is a  very nice 72 y.o.  MBM with  HTN, ASCAD, HLD, Pre-Diabetes, OSA/CPAP and Vitamin D Deficiency who presents  with a 2 week hx/o cough    Medications   Current Outpatient Medications (Cardiovascular):    Bempedoic Acid-Ezetimibe (NEXLIZET) 180-10 MG TABS, Take  1 tablet  Daily  for Cholesterol   metoprolol succinate (TOPROL-XL) 25 MG 24 hr tablet, Take 1 tablet (25 mg total) by mouth daily.   nitroGLYCERIN (NITROSTAT) 0.4 MG SL tablet, Place 1 tablet (0.4 mg total) under the tongue every 5 (five) minutes as needed for chest pain.    Current Outpatient Medications (Hematological):    clopidogrel (PLAVIX) 75 MG tablet, Take   1 tablet   Daily     to Prevent Blood Clots  Current Outpatient Medications (Other):    ascorbic acid (VITAMIN C) 1000 MG tablet, Take 1 tablet by mouth daily.   Cholecalciferol (VITAMIN D-3) 125 MCG (5000 UT) TABS, Take 1 tablet by mouth daily.   pantoprazole (PROTONIX) 40 MG tablet, Take  1 tablet  Daily  to Prevent Indigestion & Heartburn  Problem list He has Hypertension; Hyperlipidemia; Vitamin D deficiency; Former smoker; CAD (coronary artery disease); PAD (peripheral artery disease) ([place]); Chronic heart failure ([place]); OSA (obstructive sleep apnea); Elevated CK; Myalgia; BMI 23.0-23.9, adult; and Anemia on their problem list.   Observations/Objective:  There were no vitals taken for this visit.  HEENT - WNL. Neck - supple.  Chest - Clear equal BS. Cor - Nl HS. RRR w/o sig MGR. PP 1(+). No edema. MS- FROM  w/o deformities.  Gait Nl. Neuro -  Nl w/o focal abnormalities.   Assessment and Plan:      Follow Up Instructions:        I discussed the assessment and treatment plan with the patient. The patient was provided an opportunity to ask questions and all were answered. The patient agreed with the plan and demonstrated an understanding of the instructions.       The patient was advised to call back or seek an in-person evaluation if the symptoms worsen or if the condition fails to improve as anticipated.    Kirtland Bouchard, MD

## 2022-10-08 NOTE — Progress Notes (Signed)
Assessment and Plan:    Patrick Tucker was seen today for acute visit.  Diagnoses and all orders for this visit:  Flu-like symptoms -     POCT Influenza A/B- negative  Encounter for screening for COVID-19 -     POC COVID-19- negative  Bronchitis Continue Mucinex Push Fluids Chest xray ordered and if positive for pneumonia will add additional antibiotic If no improvement in symptoms in the next 5 days notify the office If develops fever, difficulty breathing go to the ER -     azithromycin (ZITHROMAX) 250 MG tablet; Take 2 tablets (500 mg) on  Day 1,  followed by 1 tablet (250 mg) once daily on Days 2 through 5. -     predniSONE (DELTASONE) 20 MG tablet; 3 tablets daily with food for 3 days, 2 tabs daily for 3 days, 1 tab a day for 5 days. -     promethazine-dextromethorphan (PROMETHAZINE-DM) 6.25-15 MG/5ML syrup; Take 5 mLs by mouth 4 (four) times daily as needed for cough.     Further disposition pending results of labs. Discussed med's effects and SE's.   Over 30 minutes of exam, counseling, chart review, and critical decision making was performed.   Future Appointments  Date Time Provider [place]  11/08/2022 10:30 AM Unk Pinto, MD [facility] None  01/20/2023  3:30 PM Deneise Lever, MD [facility] None  02/25/2023 11:00 AM Darrol Jump, NP [facility] None  04/17/2023  3:00 PM Unk Pinto, MD [facility] None    ------------------------------------------------------------------------------------------------------------------   HPI BP 138/78   Pulse 64   Temp 97.7 F (36.5 C)   Ht '5\' 9"'$  (1.753 m)   Wt 168 lb 3.2 oz (76.3 kg)   SpO2 96%   BMI 24.84 kg/m   71 y.o.male presents for productive cough of green mucus , some head congestion, sore throat.  Denies body aches, fevers, nausea , vomiting and diarrhea. He has used Paediatric nurse brand mucinex. States the cough is occur most of the day.   Bp is currently well controlled on metoprolol 25 mg BID.  Denies  headaches, chest pain and dizziness  BP Readings from Last 3 Encounters:  10/08/22 138/78  08/12/22 122/70  08/02/22 (!) 158/84      Past Medical History:  Diagnosis Date   Hyperlipidemia    Hypertension 2010   Myocardial infarction [facility]) 2015   Prediabetes 2015   ST elevation myocardial infarction (STEMI) ([facility])      Allergies  Allergen Reactions   Coreg [Carvedilol] Hives and Itching   Atorvastatin     Myalgia    Current Outpatient Medications on File Prior to Visit  Medication Sig   ascorbic acid (VITAMIN C) 1000 MG tablet Take 1 tablet by mouth daily.   Bempedoic Acid-Ezetimibe (NEXLIZET) 180-10 MG TABS Take  1 tablet  Daily  for Cholesterol   Cholecalciferol (VITAMIN D-3) 125 MCG (5000 UT) TABS Take 1 tablet by mouth daily.   clopidogrel (PLAVIX) 75 MG tablet Take   1 tablet   Daily     to Prevent Blood Clots   Dextromethorphan HBr (COUGH SUPPRESSANT PO) Take by mouth.   metoprolol succinate (TOPROL-XL) 25 MG 24 hr tablet Take 1 tablet (25 mg total) by mouth daily.   pantoprazole (PROTONIX) 40 MG tablet Take  1 tablet  Daily  to Prevent Indigestion & Heartburn   nitroGLYCERIN (NITROSTAT) 0.4 MG SL tablet Place 1 tablet (0.4 mg total) under the tongue every 5 (five) minutes as needed for chest pain.  No current facility-administered medications on file prior to visit.    ROS: all negative except above.   Physical Exam:  BP 138/78   Pulse 64   Temp 97.7 F (36.5 C)   Ht '5\' 9"'$  (1.753 m)   Wt 168 lb 3.2 oz (76.3 kg)   SpO2 96%   BMI 24.84 kg/m   General Appearance: Well nourished, in no apparent distress. Eyes: PERRLA, EOMs, conjunctiva no swelling or erythema Sinuses: No Frontal/maxillary tenderness ENT/Mouth: Ext aud canals clear, TMs without erythema, bulging. No erythema, swelling, or exudate on post pharynx.  Tonsils not swollen or erythematous. Hearing normal.  Neck: Supple, thyroid normal.  Respiratory: Respiratory effort normal, BS crackles of lower  lobes bilaterally Cardio: RRR with no MRGs. Brisk peripheral pulses without edema.  Abdomen: Soft, + BS.  Non tender, no guarding, rebound, hernias, masses. Lymphatics: Non tender without lymphadenopathy.  Musculoskeletal: Full ROM, 5/5 strength, normal gait.  Skin: Warm, dry without rashes, lesions, ecchymosis.  Neuro: Cranial nerves intact. Normal muscle tone, no cerebellar symptoms. Sensation intact.  Psych: Awake and oriented X 3, normal affect, Insight and Judgment appropriate.     Patrick Rossetti, NP 3:58 PM [facility]

## 2022-10-08 NOTE — Patient Instructions (Addendum)
Albuterol inhaler 2 puffs twice a day for 5 days then as needed for shortness of breath  Zithromax 2 tabs day 1 then 1 tab daily x 4 days  Prednisone 20 mg 3 tabs x 3 days 2 tabs x 3 days then 1 tab daily x 5 days  Chest xray ordered at [facility] [place] In M-F 8:30-3:45   Acute Bronchitis, Adult  Acute bronchitis is when air tubes in the lungs (bronchi) suddenly get swollen. The condition can make it hard for you to breathe. In adults, acute bronchitis usually goes away within 2 weeks. A cough caused by bronchitis may last up to 3 weeks. Smoking, allergies, and asthma can make the condition worse. What are the causes? Germs that cause cold and flu (viruses). The most common cause of this condition is the virus that causes the common cold. Bacteria. Substances that bother (irritate) the lungs, including: Smoke from cigarettes and other types of tobacco. Dust and pollen. Fumes from chemicals, gases, or burned fuel. Indoor or outdoor air pollution. What increases the risk? A weak body's defense system. This is also called the immune system. Any condition that affects your lungs and breathing, such as asthma. What are the signs or symptoms? A cough. Coughing up clear, yellow, or green mucus. Making high-pitched whistling sounds when you breathe, most often when you breathe out (wheezing). Runny or stuffy nose. Having too much mucus in your lungs (chest congestion). Shortness of breath. Body aches. A sore throat. How is this treated? Acute bronchitis may go away over time without treatment. Your doctor may tell you to: Drink more fluids. This will help thin your mucus so it is easier to cough up. Use a device that gets medicine into your lungs (inhaler). Use a vaporizer or a humidifier. These are machines that add water to the air. This helps with coughing and poor breathing. Take a medicine that thins mucus and helps clear it from your lungs. Take a  medicine that prevents or stops coughing. It is not common to take an antibiotic medicine for this condition. Follow these instructions at home:  Take over-the-counter and prescription medicines only as told by your doctor. Use an inhaler, vaporizer, or humidifier as told by your doctor. Take two teaspoons (10 mL) of honey at bedtime. This helps lessen your coughing at night. Drink enough fluid to keep your pee (urine) pale yellow. Do not smoke or use any products that contain nicotine or tobacco. If you need help quitting, ask your doctor. Get a lot of rest. Return to your normal activities when your doctor says that it is safe. Keep all follow-up visits. How is this prevented?  Wash your hands often with soap and water for at least 20 seconds. If you cannot use soap and water, use hand sanitizer. Avoid contact with people who have cold symptoms. Try not to touch your mouth, nose, or eyes with your hands. Avoid breathing in smoke or chemical fumes. Make sure to get the flu shot every year. Contact a doctor if: Your symptoms do not get better in 2 weeks. You have trouble coughing up the mucus. Your cough keeps you awake at night. You have a fever. Get help right away if: You cough up blood. You have chest pain. You have very bad shortness of breath. You faint or keep feeling like you are going to faint. You have a very bad headache. Your fever or chills get worse. These symptoms may be an emergency.  Get help right away. Call your local emergency services (911 in the [place] Do not wait to see if the symptoms will go away. Do not drive yourself to the hospital. Summary Acute bronchitis is when air tubes in the lungs (bronchi) suddenly get swollen. In adults, acute bronchitis usually goes away within 2 weeks. Drink more fluids. This will help thin your mucus so it is easier to cough up. Take over-the-counter and prescription medicines only as told by your doctor. Contact a doctor  if your symptoms do not improve after 2 weeks of treatment. This information is not intended to replace advice given to you by your health care provider. Make sure you discuss any questions you have with your health care provider. Document Revised: 01/17/2021 Document Reviewed: 01/17/2021 Elsevier Patient Education  [place].

## 2022-10-25 ENCOUNTER — Ambulatory Visit: Payer: Medicare HMO | Admitting: Internal Medicine

## 2022-10-28 DIAGNOSIS — G4733 Obstructive sleep apnea (adult) (pediatric): Secondary | ICD-10-CM | POA: Diagnosis not present

## 2022-11-07 ENCOUNTER — Encounter: Payer: Self-pay | Admitting: Internal Medicine

## 2022-11-07 NOTE — Progress Notes (Signed)
Future Appointments  Date Time Provider Department  11/08/2022          6 month 10:30 AM Unk Pinto, MD [facility]  01/20/2023  3:30 PM Deneise Lever, MD [facility]  02/25/2023       ov & wellness  11:00 AM Darrol Jump, NP [facility]  04/17/2023       cpe  3:00 PM Unk Pinto, MD [facility]    History of Present Illness:       This very nice 72 y.o. MBM presents for 6 month follow up with HTN, ASCAD, HLD, Pre-Diabetes, OSA/CPAP and Vitamin D Deficiency          Patient is treated for HTN  (2016)  & BP has been controlled at home. Today's BP is at goal  -  134/74.   Patient underwent PCA in  2016 and Aug 2021  with DES' s  implanted.  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  Patient has had no Angina since  his cardiac procedures .  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  Patient has had no complaints of any cardiac type chest pain, palpitations, dyspnea / orthopnea /PND, dizziness, claudication or dependent edema.  Patient is on CPAP for OSA.          Hyperlipidemia is controlled with diet & meds. Patient denies myalgias or other med SE's. Last Lipids were at goal :  Lab Results  Component Value Date   CHOL 104 08/02/2022   HDL 43 08/02/2022   LDLCALC 47 08/02/2022   TRIG 59 08/02/2022   CHOLHDL 2.4 08/02/2022     Also, the patient has history of PreDiabetes (2021) and has had no symptoms of reactive hypoglycemia, diabetic polys, paresthesias or visual blurring.  Last A1c was not at goal :  Lab Results  Component Value Date   HGBA1C 6.4 (H) 08/02/2022     Wt Readings from Last 3 Encounters:  11/08/22 163 lb 9.6 oz (74.2 kg)  10/08/22 168 lb 3.2 oz (76.3 kg)  08/12/22 162 lb 12.8 oz (73.8 kg)                                                     Further, the patient also has history of Vitamin D Deficiency and supplements vitamin D . Last vitamin D was at goal :   Lab Results  Component Value Date   VD25OH 79 04/15/2022      Current Outpatient Medications  Medication Instructions   ascorbic acid (VITAMIN C) 1000 MG tablet 1 tablet, Oral, Daily   Bempedoic Acid-Ezetimibe (NEXLIZET) 180-10 MG TABS Take  1 tablet  Daily  for Cholesterol   Cholecalciferol (VITAMIN D-3) 125 MCG (5000 UT) TABS 1 tablet, Oral, Daily   clopidogrel (PLAVIX) 75 MG tablet Take   1 tablet   Daily     to Prevent Blood Clots   metoprolol succinate (TOPROL-XL) 25 mg, Oral, Daily   nitroGLYCERIN (NITROSTAT) 0.4 mg, Sublingual, Every 5 min PRN   pantoprazole (PROTONIX) 40 MG tablet Take  1 tablet  Daily  to Prevent Indigestion & Heartburn     Allergies  Allergen Reactions   Coreg [Carvedilol] Hives and Itching   Atorvastatin     Myalgia      PMHx:   Past Medical History:  Diagnosis Date   Hyperlipidemia    Hypertension 2010   Myocardial infarction [facility]) 2015   Prediabetes 2015   ST elevation myocardial infarction (STEMI) ([place])      Immunization History  Administered Date(s) Administered   --COV-2 Vacc 11/04/2019, 11/25/2019, 07/09/2020, 04/21/2021   Pfizer Covid-19  Bival Booster  08/04/2021   Tetanus 09/30/2014     Past Surgical History:  Procedure Laterality Date   CORONARY STENT INTERVENTION N/A 05/05/2020   Procedure: CORONARY STENT INTERVENTION;  Surgeon: Troy Sine, MD;  Location: [place] [facility];  Service: Cardiovascular;  Laterality: N/A;   CORONARY/GRAFT ACUTE MI REVASCULARIZATION N/A 05/05/2020   Procedure: Coronary/Graft Acute MI Revascularization;  Surgeon: Troy Sine, MD;  Location: [place] [facility];  Service: Cardiovascular;  Laterality: N/A;   LEFT HEART CATH AND CORONARY ANGIOGRAPHY N/A 05/05/2020   Procedure: LEFT HEART CATH AND CORONARY ANGIOGRAPHY;  Surgeon: Troy Sine, MD;  Location: [place] [facility];  Service: Cardiovascular;  Laterality: N/A;     FHx:    Reviewed / unchanged   SHx:    Reviewed / unchanged    Systems Review:  Constitutional: Denies fever, chills, wt  changes, headaches, insomnia, fatigue, night sweats, change in appetite. Eyes: Denies redness, blurred vision, diplopia, discharge, itchy, watery eyes.  ENT: Denies discharge, congestion, post nasal drip, epistaxis, sore throat, earache, hearing loss, dental pain, tinnitus, vertigo, sinus pain, snoring.  CV: Denies chest pain, palpitations, irregular heartbeat, syncope, dyspnea, diaphoresis, orthopnea, PND, claudication or edema. Respiratory: denies cough, dyspnea, DOE, pleurisy, hoarseness, laryngitis, wheezing.  Gastrointestinal: Denies dysphagia, odynophagia, heartburn, reflux, water brash, abdominal pain or cramps, nausea, vomiting, bloating, diarrhea, constipation, hematemesis, melena, hematochezia  or hemorrhoids. Genitourinary: Denies dysuria, frequency, urgency, nocturia, hesitancy, discharge, hematuria or flank pain. Musculoskeletal: Denies arthralgias, myalgias, stiffness, jt. swelling, pain, limping or strain/sprain.  Skin: Denies pruritus, rash, hives, warts, acne, eczema or change in skin lesion(s). Neuro: No weakness, tremor, incoordination, spasms, paresthesia or pain. Psychiatric: Denies confusion, memory loss or sensory loss. Endo: Denies change in weight, skin or hair change.  Heme/Lymph: No excessive bleeding, bruising or enlarged lymph nodes.   Physical Exam  BP 134/74   Pulse 66   Temp 97.9 F (36.6 C)   Resp 16   Ht 5' 9"$  (1.753 m)   Wt 163 lb 9.6 oz (74.2 kg)   SpO2 97%   BMI 24.16 kg/m   Appears  well nourished, well groomed  and in no distress.  Eyes: PERRLA, EOMs, conjunctiva no swelling or erythema. Sinuses: No frontal/maxillary tenderness ENT/Mouth: EAC's clear, TM's nl w/o erythema, bulging. Nares clear w/o erythema, swelling, exudates. Oropharynx clear without erythema or exudates. Oral hygiene is good. Tongue normal, non obstructing. Hearing intact.  Neck: Supple. Thyroid not palpable. Car 2+/2+ without bruits, nodes or JVD. Chest: Respirations nl with  BS clear & equal w/o rales, rhonchi, wheezing or stridor.  Cor: Heart sounds normal w/ regular rate and rhythm without sig. murmurs, gallops, clicks or rubs. Peripheral pulses normal and equal  without edema.  Abdomen: Soft & bowel sounds normal. Non-tender w/o guarding, rebound, hernias, masses or organomegaly.  Lymphatics: Unremarkable.  Musculoskeletal: Full ROM all peripheral extremities, joint stability, 5/5 strength and normal gait.  Skin: Warm, dry without exposed rashes, lesions or ecchymosis apparent.  Neuro: Cranial nerves intact, reflexes equal bilaterally. Sensory-motor testing grossly intact. Tendon reflexes grossly intact.  Pysch: Alert & oriented x 3.  Insight and judgement nl & appropriate. No ideations.   Assessment  and Plan:  1. Essential hypertension  - Continue medication, monitor blood pressure at home.  - Continue DASH diet.  Reminder to go to the ER if any CP,  SOB, nausea, dizziness, severe HA, changes vision/speech.    - CBC with Differential/Platelet - COMPLETE METABOLIC PANEL WITH GFR - Magnesium - TSH  2. Hyperlipidemia, mixed  - Continue diet/meds, exercise,& lifestyle modifications.  - Continue monitor periodic cholesterol/liver & renal functions     - Lipid panel - TSH  3. Abnormal glucose  - Continue diet, exercise  - Lifestyle modifications.  - Monitor appropriate labs      - Hemoglobin A1c - Insulin, random  4. Vitamin D deficiency  - VITAMIN D 25 Hydroxy   - Continue supplementation    5. Coronary artery disease involving native coronary artery of native heart without angina pectoris  - Lipid panel  6. Stage 3a chronic kidney disease (HCC)  - COMPLETE METABOLIC PANEL WITH GFR  7. Encounter for general adult medical examination with abnormal findings  - CBC with Differential/Platelet - COMPLETE METABOLIC PANEL WITH GFR - Magnesium - Lipid panel - TSH - Hemoglobin A1c - Insulin, random - VITAMIN D 25  Hydroxy          Discussed  regular exercise, BP monitoring, weight control to achieve/maintain BMI less than 25 and discussed med and SE's. Recommended labs to assess /monitor clinical status .  I discussed the assessment and treatment plan with the patient. The patient was provided an opportunity to ask questions and all were answered. The patient agreed with the plan and demonstrated an understanding of the instructions.  I provided over 30 minutes of exam, counseling, chart review and  complex critical decision making.        The patient was advised to call back or seek an in-person evaluation if the symptoms worsen or if the condition fails to improve as anticipated.   Kirtland Bouchard, MD

## 2022-11-07 NOTE — Patient Instructions (Signed)

## 2022-11-08 ENCOUNTER — Encounter: Payer: Self-pay | Admitting: Internal Medicine

## 2022-11-08 ENCOUNTER — Ambulatory Visit (INDEPENDENT_AMBULATORY_CARE_PROVIDER_SITE_OTHER): Payer: Medicare HMO | Admitting: Internal Medicine

## 2022-11-08 VITALS — BP 134/74 | HR 66 | Temp 97.9°F | Resp 16 | Ht 69.0 in | Wt 163.6 lb

## 2022-11-08 DIAGNOSIS — Z0001 Encounter for general adult medical examination with abnormal findings: Secondary | ICD-10-CM

## 2022-11-08 DIAGNOSIS — N1831 Chronic kidney disease, stage 3a: Secondary | ICD-10-CM

## 2022-11-08 DIAGNOSIS — E559 Vitamin D deficiency, unspecified: Secondary | ICD-10-CM

## 2022-11-08 DIAGNOSIS — E782 Mixed hyperlipidemia: Secondary | ICD-10-CM

## 2022-11-08 DIAGNOSIS — I10 Essential (primary) hypertension: Secondary | ICD-10-CM | POA: Diagnosis not present

## 2022-11-08 DIAGNOSIS — I2510 Atherosclerotic heart disease of native coronary artery without angina pectoris: Secondary | ICD-10-CM | POA: Diagnosis not present

## 2022-11-08 DIAGNOSIS — R7309 Other abnormal glucose: Secondary | ICD-10-CM | POA: Diagnosis not present

## 2022-11-09 NOTE — Progress Notes (Signed)
<><><><><><><><><><><><><><><><><><><><><><><><><><><><><><><><><> <><><><><><><><><><><><><><><><><><><><><><><><><><><><><><><><><>  -   A1c = 6.5%  - Now is definitely Diabetic & when it gets to 7.0%                                                                      - then it's time to start meds for Diabetes   - Unless, get on a better diet & lose weight   - Recommend order on [employer] - Naturebell Ceylon Cinnamon 9,000 mg     #240 caps for $13.95   and    take 1 capsule  /day to help lower blood sugar      ( 8 month supply = $1.75 / month)  <><><><><><><><><><><><><><><><><><><><><><><><><><><><><><><><><> <><><><><><><><><><><><><><><><><><><><><><><><><><><><><><><><><>  - Vitamin D = 61  - Great  - Please keep dosage same  <><><><><><><><><><><><><><><><><><><><><><><><><><><><><><><><><> <><><><><><><><><><><><><><><><><><><><><><><><><><><><><><><><><>  - Chol = 126   &   LDL = 58  - Both  Excellent   - Very low risk for Heart Attack  / Stroke <><><><><><><><><><><><><><><><><><><><><><><><><><><><><><><><><> <><><><><><><><><><><><><><><><><><><><><><><><><><><><><><><><><>  - All Else - CBC - Kidneys - Electrolytes - Liver - Magnesium & Thyroid    - all  Normal / OK <><><><><><><><><><><><><><><><><><><><><><><><><><><><><><><><><> <><><><><><><><><><><><><><><><><><><><><><><><><><><><><><><><><>                <><><><><><><><><><><><><><><><><><><><><><><><><><><><><><><><><> <><><><><><><><><><><><><><><><><><><><><><><><><><><><><><><><><>  -

## 2022-11-11 LAB — LIPID PANEL
Cholesterol: 126 mg/dL (ref <200)
HDL: 56 mg/dL (ref >=40)
LDL Cholesterol (Calc): 58 mg/dL (calc)
Non-HDL Cholesterol (Calc): 70 mg/dL (calc) (ref <130)
Total CHOL/HDL Ratio: 2.3 (calc) (ref <5.0)
Triglycerides: 50 mg/dL (ref <150)

## 2022-11-11 LAB — CBC WITH DIFFERENTIAL/PLATELET
Absolute Monocytes: 425 cells/uL (ref 200–950)
Basophils Absolute: 31 cells/uL (ref 0–200)
Basophils Relative: 1 %
Eosinophils Absolute: 152 cells/uL (ref 15–500)
Eosinophils Relative: 4.9 %
HCT: 42.5 % (ref 38.5–50.0)
Hemoglobin: 14.3 g/dL (ref 13.2–17.1)
Lymphs Abs: 1271 cells/uL (ref 850–3900)
MCH: 30.5 pg (ref 27.0–33.0)
MCHC: 33.6 g/dL (ref 32.0–36.0)
MCV: 90.6 fL (ref 80.0–100.0)
MPV: 10.6 fL (ref 7.5–12.5)
Monocytes Relative: 13.7 %
Neutro Abs: 1221 cells/uL — ABNORMAL LOW (ref 1500–7800)
Neutrophils Relative %: 39.4 %
Platelets: 219 10*3/uL (ref 140–400)
RBC: 4.69 10*6/uL (ref 4.20–5.80)
RDW: 12.8 % (ref 11.0–15.0)
Total Lymphocyte: 41 %
WBC: 3.1 10*3/uL — ABNORMAL LOW (ref 3.8–10.8)

## 2022-11-11 LAB — COMPLETE METABOLIC PANEL WITH GFR
AG Ratio: 2 (calc) (ref 1.0–2.5)
ALT: 33 U/L (ref 9–46)
AST: 31 U/L (ref 10–35)
Albumin: 4.4 g/dL (ref 3.6–5.1)
Alkaline phosphatase (APISO): 111 U/L (ref 35–144)
BUN: 13 mg/dL (ref 7–25)
CO2: 29 mmol/L (ref 20–32)
Calcium: 9.8 mg/dL (ref 8.6–10.3)
Chloride: 104 mmol/L (ref 98–110)
Creat: 0.87 mg/dL (ref 0.70–1.35)
Globulin: 2.2 g/dL (calc) (ref 1.9–3.7)
Glucose, Bld: 101 mg/dL — ABNORMAL HIGH (ref 65–99)
Potassium: 4.7 mmol/L (ref 3.5–5.3)
Sodium: 141 mmol/L (ref 135–146)
Total Bilirubin: 0.6 mg/dL (ref 0.2–1.2)
Total Protein: 6.6 g/dL (ref 6.1–8.1)
eGFR: 93 mL/min/{1.73_m2} (ref >=60)

## 2022-11-11 LAB — INSULIN, RANDOM: Insulin: 8 u[IU]/mL

## 2022-11-11 LAB — MAGNESIUM: Magnesium: 2.2 mg/dL (ref 1.5–2.5)

## 2022-11-11 LAB — VITAMIN D 25 HYDROXY (VIT D DEFICIENCY, FRACTURES): Vit D, 25-Hydroxy: 61 ng/mL (ref 30–100)

## 2022-11-11 LAB — HEMOGLOBIN A1C
Hgb A1c MFr Bld: 6.5 % of total Hgb — ABNORMAL HIGH (ref <5.7)
Mean Plasma Glucose: 140 mg/dL
eAG (mmol/L): 7.7 mmol/L

## 2022-11-11 LAB — TSH: TSH: 1.01 mIU/L (ref 0.40–4.50)

## 2022-11-15 DIAGNOSIS — I2510 Atherosclerotic heart disease of native coronary artery without angina pectoris: Secondary | ICD-10-CM | POA: Diagnosis not present

## 2022-11-26 ENCOUNTER — Other Ambulatory Visit: Payer: Self-pay | Admitting: Internal Medicine

## 2022-11-26 MED ORDER — LISINOPRIL 10 MG PO TABS: ORAL_TABLET | ORAL | 3 refills | Status: DC

## 2022-11-28 DIAGNOSIS — G4733 Obstructive sleep apnea (adult) (pediatric): Secondary | ICD-10-CM | POA: Diagnosis not present

## 2022-12-27 DIAGNOSIS — G4733 Obstructive sleep apnea (adult) (pediatric): Secondary | ICD-10-CM | POA: Diagnosis not present

## 2023-01-19 NOTE — Progress Notes (Addendum)
HPI M former smoker followed for OSA, complicated by CAD/ MI, HTN, PAD, CHF, Hyperlipidemia, Vit D deficiency NPSG 03/01/21- AHi 5.6/ hr, desaturation to 92%, body weight 161 lbs  ==============================================================   01/17/22- 70 yoM former smoker followed for OSA, complicated by CAD/ MI, HTN, PAD, CHF, Hyperlipidemia, Vit D deficiency CPAP auto 4-15/ [facility]   ordered 04/05/21 Download-compliance 98%, AHI 1.7/ hr Body weight today-161 lbs Covid vax- 5 Phizer Flu vax-declines -----Patient is doing good states that he wakes up sometimes during the night and stares at the wall but then will fall back asleep Download reviewed. Says he wakes about 15 minutes most nights, like habit, but then goes back to sleep. Declines sleep med. Bedtime 8:30, up around 4:00AM. Walks 2 miles and does 100 push ups each mornng.  01/20/23-  71 yoM former smoker followed for OSA,  complicated by CAD/ MI, HTN, PAD, CHF, Hyperlipidemia, Vit D deficiency CPAP auto 4-15/ [facility]   ordered 04/05/21 Download-compliance 87%, AHI 3.8/hr Body weight today-167 lbs Download reviewed.  He is meeting compliance and control goals but says the mask is hurting his nose.  He sleeps better and is less tired with CPAP.  ROS-see HPI   + = positive Constitutional:    weight loss, night sweats, fevers, chills, +fatigue, lassitude. HEENT:    headaches, difficulty swallowing, tooth/dental problems, sore throat,       sneezing, itching, ear ache, nasal congestion, post nasal drip, snoring CV:    chest pain, orthopnea, PND, swelling in lower extremities, anasarca,                                   dizziness, palpitations Resp:   shortness of breath with exertion or at rest.                productive cough,   non-productive cough, coughing up of blood.              change in color of mucus.  wheezing.   Skin:    rash or lesions. GI:  No-   heartburn, indigestion, abdominal pain, nausea, vomiting, diarrhea,                  change in bowel habits, loss of appetite GU: dysuria, change in color of urine, no urgency or frequency.   flank pain. MS:   joint pain, stiffness, decreased range of motion, back pain. Neuro-     nothing unusual Psych:  change in mood or affect.  depression or anxiety.   memory loss.  OBJ- Physical Exam General- Alert, Oriented, Affect-appropriate, Distress- none acute, slender Skin- rash-none, lesions- none, excoriation- none Lymphadenopathy- none Head- atraumatic            Eyes- Gross vision intact, PERRLA, conjunctivae and secretions clear            Ears- Hearing, canals-normal            Nose- + is an irritated area on the bridge of his nose from CPAP mask pressure.            Throat- Mallampati III-IV , mucosa clear , drainage- none, tonsils- atrophic, +teeth Neck- flexible , trachea midline, no stridor , thyroid nl, carotid no bruit Chest - symmetrical excursion , unlabored           Heart/CV- RRR , no murmur , no gallop  , no rub, nl s1 s2                           -  JVD- none , edema- none, stasis changes- none, varices- none           Lung- clear to P&A, wheeze- none, cough- none , dullness-none, rub- none           Chest wall-  Abd-  Br/ Gen/ Rectal- Not done, not indicated Extrem- cyanosis- none, clubbing, none, atrophy- none, strength- nl Neuro- grossly intact to observation

## 2023-01-20 ENCOUNTER — Encounter: Payer: Self-pay | Admitting: Internal Medicine

## 2023-01-20 ENCOUNTER — Ambulatory Visit: Payer: Medicare HMO | Admitting: Internal Medicine

## 2023-01-20 VITALS — BP 124/64 | HR 99 | Ht 69.0 in | Wt 167.8 lb

## 2023-01-20 DIAGNOSIS — I2510 Atherosclerotic heart disease of native coronary artery without angina pectoris: Secondary | ICD-10-CM

## 2023-01-20 DIAGNOSIS — I509 Heart failure, unspecified: Secondary | ICD-10-CM | POA: Diagnosis not present

## 2023-01-20 DIAGNOSIS — G4733 Obstructive sleep apnea (adult) (pediatric): Secondary | ICD-10-CM | POA: Diagnosis not present

## 2023-01-20 NOTE — Patient Instructions (Signed)
Order- DME [facility]- please refit mask- try nasal or nasal pillows mask with chin strap. Current mask is irritating skin of nose.  Continue auto 4-15

## 2023-01-24 ENCOUNTER — Ambulatory Visit: Payer: Medicare HMO | Admitting: Nurse Practitioner

## 2023-01-27 DIAGNOSIS — G4733 Obstructive sleep apnea (adult) (pediatric): Secondary | ICD-10-CM | POA: Diagnosis not present

## 2023-02-13 DIAGNOSIS — M791 Myalgia, unspecified site: Secondary | ICD-10-CM

## 2023-02-13 NOTE — Progress Notes (Signed)
[facility])     [facility] Quality Pharmacy Team Statin Quality Measure Assessment  02/13/2023  Patrick Tucker June 07, 1951 [id number]  Per review of chart and payor information, patient has a diagnosis of cardiovascular disease but is not currently filling a statin prescription. This places patient into the [facility] (Statin Use In Patients with Cardiovascular Disease) measure for CMS.    Patient has documented allergy to statin but no corresponding CPT codes that would exclude patient from [facility] measure.      Component Value Date/Time   CHOL 126 11/08/2022 0000   TRIG 50 11/08/2022 0000   HDL 56 11/08/2022 0000   CHOLHDL 2.3 11/08/2022 0000   VLDL 25 05/05/2020 1923   LDLCALC 58 11/08/2022 0000    Please consider ONE of the following recommendations:  Initiate high intensity statin Atorvastatin 40mg  once daily, #90, 3 refills   Rosuvastatin 20mg  once daily, #90, 3 refills    Initiate moderate intensity  statin with reduced frequency if prior  statin intolerance 1x weekly, #13, 3 refills   2x weekly, #26, 3 refills   3x weekly, #39, 3 refills    Code for past statin intolerance  (required annually)  Provider Requirements: Must associate code during an office visit or telehealth encounter   Drug Induced Myopathy G72.0   Myalgia (SPC ONLY) M79.1   Myositis, unspecified M60.9   Myopathy, unspecified G72.9   Rhabdomyolysis M62.82   Thank you for allowing [facility] to be a part of this patient's care.   Harlon Flor, PharmD Clinical Pharmacist  [facility] [employer] [facility] [phone]

## 2023-02-14 ENCOUNTER — Ambulatory Visit (INDEPENDENT_AMBULATORY_CARE_PROVIDER_SITE_OTHER): Payer: Medicare HMO | Admitting: Nurse Practitioner

## 2023-02-14 ENCOUNTER — Encounter: Payer: Self-pay | Admitting: Nurse Practitioner

## 2023-02-14 VITALS — BP 130/84 | HR 55 | Temp 97.6°F | Ht 69.0 in | Wt 167.6 lb

## 2023-02-14 DIAGNOSIS — Z0001 Encounter for general adult medical examination with abnormal findings: Secondary | ICD-10-CM | POA: Diagnosis not present

## 2023-02-14 DIAGNOSIS — Z6823 Body mass index (BMI) 23.0-23.9, adult: Secondary | ICD-10-CM | POA: Diagnosis not present

## 2023-02-14 DIAGNOSIS — G4733 Obstructive sleep apnea (adult) (pediatric): Secondary | ICD-10-CM

## 2023-02-14 DIAGNOSIS — R7309 Other abnormal glucose: Secondary | ICD-10-CM

## 2023-02-14 DIAGNOSIS — I10 Essential (primary) hypertension: Secondary | ICD-10-CM | POA: Diagnosis not present

## 2023-02-14 DIAGNOSIS — E559 Vitamin D deficiency, unspecified: Secondary | ICD-10-CM

## 2023-02-14 DIAGNOSIS — G720 Drug-induced myopathy: Secondary | ICD-10-CM

## 2023-02-14 DIAGNOSIS — Z79899 Other long term (current) drug therapy: Secondary | ICD-10-CM

## 2023-02-14 DIAGNOSIS — I739 Peripheral vascular disease, unspecified: Secondary | ICD-10-CM

## 2023-02-14 DIAGNOSIS — R6889 Other general symptoms and signs: Secondary | ICD-10-CM

## 2023-02-14 DIAGNOSIS — I509 Heart failure, unspecified: Secondary | ICD-10-CM

## 2023-02-14 DIAGNOSIS — Z87891 Personal history of nicotine dependence: Secondary | ICD-10-CM | POA: Diagnosis not present

## 2023-02-14 DIAGNOSIS — E782 Mixed hyperlipidemia: Secondary | ICD-10-CM

## 2023-02-14 DIAGNOSIS — Z0000 Encounter for general adult medical examination without abnormal findings: Secondary | ICD-10-CM

## 2023-02-14 DIAGNOSIS — T466X5A Adverse effect of antihyperlipidemic and antiarteriosclerotic drugs, initial encounter: Secondary | ICD-10-CM
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
## 2023-02-14 NOTE — Patient Instructions (Signed)

## 2023-02-15 LAB — LIPID PANEL
Cholesterol: 124 mg/dL (ref <200)
HDL: 38 mg/dL — ABNORMAL LOW (ref >=40)
LDL Cholesterol (Calc): 67 mg/dL (calc)
Non-HDL Cholesterol (Calc): 86 mg/dL (calc) (ref <130)
Total CHOL/HDL Ratio: 3.3 (calc) (ref <5.0)
Triglycerides: 109 mg/dL (ref <150)

## 2023-02-15 LAB — CBC WITH DIFFERENTIAL/PLATELET
Absolute Monocytes: 616 cells/uL (ref 200–950)
Basophils Absolute: 51 cells/uL (ref 0–200)
Basophils Relative: 1.3 %
Eosinophils Absolute: 277 cells/uL (ref 15–500)
Eosinophils Relative: 7.1 %
HCT: 43.3 % (ref 38.5–50.0)
Hemoglobin: 14.4 g/dL (ref 13.2–17.1)
Lymphs Abs: 1342 cells/uL (ref 850–3900)
MCH: 30.3 pg (ref 27.0–33.0)
MCHC: 33.3 g/dL (ref 32.0–36.0)
MCV: 91 fL (ref 80.0–100.0)
MPV: 10.5 fL (ref 7.5–12.5)
Monocytes Relative: 15.8 %
Neutro Abs: 1615 cells/uL (ref 1500–7800)
Neutrophils Relative %: 41.4 %
Platelets: 228 10*3/uL (ref 140–400)
RBC: 4.76 10*6/uL (ref 4.20–5.80)
RDW: 12.1 % (ref 11.0–15.0)
Total Lymphocyte: 34.4 %
WBC: 3.9 10*3/uL (ref 3.8–10.8)

## 2023-02-15 LAB — COMPLETE METABOLIC PANEL WITH GFR
AG Ratio: 2.1 (calc) (ref 1.0–2.5)
ALT: 25 U/L (ref 9–46)
AST: 23 U/L (ref 10–35)
Albumin: 4.2 g/dL (ref 3.6–5.1)
Alkaline phosphatase (APISO): 133 U/L (ref 35–144)
BUN: 16 mg/dL (ref 7–25)
CO2: 29 mmol/L (ref 20–32)
Calcium: 9.1 mg/dL (ref 8.6–10.3)
Chloride: 101 mmol/L (ref 98–110)
Creat: 1.03 mg/dL (ref 0.70–1.35)
Globulin: 2 g/dL (calc) (ref 1.9–3.7)
Glucose, Bld: 97 mg/dL (ref 65–99)
Potassium: 4.8 mmol/L (ref 3.5–5.3)
Sodium: 138 mmol/L (ref 135–146)
Total Bilirubin: 0.6 mg/dL (ref 0.2–1.2)
Total Protein: 6.2 g/dL (ref 6.1–8.1)
eGFR: 79 mL/min/{1.73_m2} (ref >=60)

## 2023-02-15 LAB — HEMOGLOBIN A1C
Hgb A1c MFr Bld: 6.6 % of total Hgb — ABNORMAL HIGH (ref <5.7)
Mean Plasma Glucose: 143 mg/dL
eAG (mmol/L): 7.9 mmol/L

## 2023-02-15 LAB — VITAMIN D 25 HYDROXY (VIT D DEFICIENCY, FRACTURES): Vit D, 25-Hydroxy: 49 ng/mL (ref 30–100)

## 2023-02-21 ENCOUNTER — Encounter: Payer: Self-pay | Admitting: Internal Medicine

## 2023-02-21 NOTE — Assessment & Plan Note (Signed)
He continues cardiology follow-up °

## 2023-02-21 NOTE — Assessment & Plan Note (Signed)
He denies acute changes.  Continues follow-up with cardiology.

## 2023-02-21 NOTE — Assessment & Plan Note (Signed)
Mild baseline OSA but he benefits from CPAP with improved sleep despite mask discomfort Plan-continue auto 4-15.  Refit mask and consider use of chinstrap.

## 2023-02-25 ENCOUNTER — Ambulatory Visit: Payer: Medicare HMO | Admitting: Nurse Practitioner

## 2023-02-26 DIAGNOSIS — G4733 Obstructive sleep apnea (adult) (pediatric): Secondary | ICD-10-CM | POA: Diagnosis not present

## 2023-03-29 DIAGNOSIS — G4733 Obstructive sleep apnea (adult) (pediatric): Secondary | ICD-10-CM | POA: Diagnosis not present

## 2023-04-17 ENCOUNTER — Encounter: Payer: Medicare HMO | Admitting: Internal Medicine

## 2023-05-02 DIAGNOSIS — G4733 Obstructive sleep apnea (adult) (pediatric): Secondary | ICD-10-CM | POA: Diagnosis not present

## 2023-06-02 DIAGNOSIS — G4733 Obstructive sleep apnea (adult) (pediatric): Secondary | ICD-10-CM | POA: Diagnosis not present

## 2023-06-05 ENCOUNTER — Encounter: Payer: Self-pay | Admitting: Internal Medicine

## 2023-06-05 NOTE — Patient Instructions (Signed)

## 2023-06-05 NOTE — Progress Notes (Unsigned)
Annual  Screening/Preventative Visit  & Comprehensive Evaluation & Examination   Future Appointments  Date Time Provider Department  06/06/2023         cpe 10:00 AM Lucky Cowboy, MD [facility]  12/04/2023        wellness 11:00 AM Adela Glimpse, NP [facility]  01/20/2024  3:30 PM Waymon Budge, MD [facility]  06/15/2024        cpe 10:00 AM Lucky Cowboy, MD [facility]            This very nice 72 y.o.  MBM presents for a Screening /Preventative Visit & comprehensive evaluation and management of multiple medical co-morbidities.  Patient has been followed for  HTN, ASCAD, HLD, Pre-Diabetes and Vitamin D Deficiency. Patient is on CPAP for OSA. Patient also has hx/ o Vitamin B12 Deficiency.       HTN predates since 2016 .  Patient's BP has been controlled at home.  Today's BP is at goal -  138/74.  Patient underwent PCA in  2016 w/DES implanted.  Then in Aug 2021, he had a 2sd DES implanted.  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  Patient has had no Angina since  his cardiac procedures . He also had hx/o systolic CHF which stabilized post recovery.  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 He continues  f/u for his ASPVD. Patient still follows with Dr Elyn Peers at [facility].  Patient denies any cardiac symptoms as chest pain, palpitations, shortness of breath, dizziness or ankle swelling.        Patient's last lipids are controlled with diet /Atorvastatin. Patient denies myalgias or other medication SE's. Last lipids were at goal :  Lab Results  Component Value Date   CHOL 124 02/14/2023   HDL 38 (L) 02/14/2023   LDLCALC 67 02/14/2023   TRIG 109 02/14/2023   CHOLHDL 3.3 02/14/2023         Patient has hx/o prediabetes (2021) and patient denies reactive hypoglycemic symptoms, visual blurring, diabetic polys or paresthesias. Last A1c was not at goal :   Lab Results  Component Value Date   HGBA1C 6.6 (H) 02/14/2023         Finally,  patient has history of Vitamin D Deficiency and last vitamin D was at goal :   Lab Results  Component Value Date   VD25OH 80 03/05/2021       Current Meds : Current Outpatient Medications  Medication Instructions   VITAMIN C 1000 MG tablet 1 tablet Daily   NEXLIZET 180-10 MG TABS Take  1 tablet  Daily   VITAMIN D  5000 u 1 tablet, Oral, Daily   clopidogrel 75 MG tablet Take   1 tablet   Daily     lisinopril  10 MG tablet Take 1 tablet daily   metoprolol succinate-XL  25 mg     Daily   NITROSTAT  0.4 mg  SL  Every 5 min PRN   pantoprazole 40 MG tablet Take  1 tablet  Daily      Allergies  Allergen Reactions   Coreg [Carvedilol] Hives and Itching     Past Medical History:  Diagnosis Date   Hyperlipidemia    Hypertension 2010   Myocardial infarction (HCC) 2015   Prediabetes 2015     Health Maintenance  Topic Date Due   Pneumococcal Vaccine 52-85 Years old (1 of 4 - PCV13) Never done   COLONOSCOPY Never done  Zoster Vaccines- Shingrix (1 of 2) Never done   INFLUENZA VACCINE  04/30/2021   TETANUS/TDAP  09/30/2024   COVID-19 Vaccine  Completed   Hepatitis C Screening  Completed   HPV VACCINES  Aged Out   PNA vac Low Risk Adult  Discontinued     Immunization History  Administered Date(s) Administered   PFIZER SARS-COV-2 Vacc 11/04/2019, 11/25/2019, 07/09/2020   Tetanus 09/30/2014    Last Colon - Alleg[facility] he had a Colonoscopy 4-5 years ago.    Past Surgical History:  Procedure Laterality Date   CORONARY STENT INTERVENTION N/A 05/05/2020   Procedure: CORONARY STENT INTERVENTION;  Surgeon: Lennette Bihari, MD;  Location: [facility];  Service: Cardiovascular;  Laterality: N/A;   CORONARY/GRAFT ACUTE MI REVASCULARIZATION N/A 05/05/2020   Procedure: Coronary/Graft Acute MI Revascularization;  Surgeon: Lennette Bihari, MD;  Location: [facility];  Service: Cardiovascular;  Laterality: N/A;   LEFT HEART CATH AND CORONARY ANGIOGRAPHY N/A 05/05/2020    Procedure: LEFT HEART CATH AND CORONARY ANGIOGRAPHY;  Surgeon: Lennette Bihari, MD;  Location: [facility]AS[facility];  Service: Cardiovascular;  Laterality: N/A;     Family History  Problem Relation Age of Onset   Cancer Mother    Hypertension Father    Heart disease Father    Diabetes Sister    Stroke Brother    COPD Brother    Hypertension Brother    COPD Brother      Social History   Socioeconomic History   Marital status: Married    Spouse name: Maralyn Sago   Number of children: 1  Occupational History   Retired  Tobacco Use   Smoking status: Former Smoker   Smokeless tobacco: Never Used  Building services engineer Use: Never used  Substance and Sexual Activity   Alcohol use: Not Currently   Drug use: Not on file   Sexual activity: Not on file      ROS Constitutional: Denies fever, chills, weight loss/gain, headaches, insomnia,  night sweats or change in appetite. Does c/o fatigue. Eyes: Denies redness, blurred vision, diplopia, discharge, itchy or watery eyes.  ENT: Denies discharge, congestion, post nasal drip, epistaxis, sore throat, earache, hearing loss, dental pain, Tinnitus, Vertigo, Sinus pain or snoring.  Cardio: Denies chest pain, palpitations, irregular heartbeat, syncope, dyspnea, diaphoresis, orthopnea, PND, claudication or edema Respiratory: denies cough, dyspnea, DOE, pleurisy, hoarseness, laryngitis or wheezing.  Gastrointestinal: Denies dysphagia, heartburn, reflux, water brash, pain, cramps, nausea, vomiting, bloating, diarrhea, constipation, hematemesis, melena, hematochezia, jaundice or hemorrhoids Genitourinary: Denies dysuria, frequency, urgency, nocturia, hesitancy, discharge, hematuria or flank pain Musculoskeletal: Denies arthralgia, myalgia, stiffness, Jt. Swelling, pain, limp or strain/sprain. Denies Falls. Skin: Denies puritis, rash, hives, warts, acne, eczema or change in skin lesion Neuro: No weakness, tremor, incoordination, spasms, paresthesia or  pain Psychiatric: Denies confusion, memory loss or sensory loss. Denies Depression. Endocrine: Denies change in weight, skin, hair change, nocturia, and paresthesia, diabetic polys, visual blurring or hyper / hypo glycemic episodes.  Heme/Lymph: No excessive bleeding, bruising or enlarged lymph nodes.   Physical Exam  BP 138/74   Pulse 62   Temp 98 F (36.7 C)   Ht 5\' 9"  (1.753 m)   Wt 166 lb 9.6 oz (75.6 kg)   SpO2 98%   BMI 24.60 kg/m   General Appearance: Well nourished and well groomed and in no apparent distress.  Eyes: PERRLA, EOMs, conjunctiva no swelling or erythema, normal fundi and vessels. Sinuses: No frontal/maxillary tenderness ENT/Mouth: EACs  patent / TMs  nl. Nares clear without erythema, swelling, mucoid exudates. Oral hygiene is good. No erythema, swelling, or exudate. Tongue normal, non-obstructing. Tonsils not swollen or erythematous. Hearing normal.  Neck: Supple, thyroid not palpable. No bruits, nodes or JVD. Respiratory: Respiratory effort normal.  BS equal and clear bilateral without rales, rhonci, wheezing or stridor. Cardio: Heart sounds are normal with regular rate and rhythm and no murmurs, rubs or gallops. Peripheral pulses are 1+/1+ without edema. No aortic or femoral bruits. Chest: symmetric with normal excursions and percussion.  Abdomen: Soft, with Nl bowel sounds. Nontender, no guarding, rebound, hernias, masses, or organomegaly.  Lymphatics: Non tender without lymphadenopathy.  Musculoskeletal: Full ROM all peripheral extremities, joint stability, 5/5 strength, and normal gait. Skin: Warm and dry without rashes, lesions, cyanosis, clubbing or  ecchymosis.  Neuro: Cranial nerves intact, reflexes equal bilaterally. Normal muscle tone, no cerebellar symptoms. Sensation intact.  Pysch: Alert and oriented X 3 with normal affect, insight and judgment appropriate.   Assessment and Plan  1. Annual Preventative/Screening Exam    2. Essential  hypertension  - [place], RETROPERITNL ABD,  LTD - Urinalysis, Routine w reflex microscopic - Microalbumin / creatinine urine ratio - CBC with Differential/Platelet - COMPLETE METABOLIC PANEL WITH GFR - Magnesium - TSH - EKG 12-Lead   3. Hyperlipidemia, mixed  - [place], RETROPERITNL ABD,  LTD - Lipid panel - TSH - EKG 12-Lead   4. Abnormal glucose  - [place], RETROPERITNL ABD,  LTD - Hemoglobin A1c - Insulin, random - EKG 12-Lead   5. Vitamin D deficiency  - VITAMIN D 25 Hydroxy    6. Coronary artery disease involving native coronary                                   artery of native heart without angina pectoris  - Lipid panel - EKG 12-Lead   7. PAD (peripheral artery disease) (HCC)  - Lipid panel   8. Stage 3a chronic kidney disease (HCC)  - Urinalysis, Routine w reflex microscopic - Microalbumin / creatinine urine ratio - Parathyroid hormone, intact (no Ca) - COMPLETE METABOLIC PANEL WITH GFR   9. OSA on CPAP   10. BPH with obstruction/lower urinary tract symptoms  - PSA   11. Prostate cancer screening  - PSA  12. Screening for colorectal cancer  - POC Hemoccult Bld/Stl    13. Screening for heart disease  - EKG 12-Lead   14. FHx: heart disease  - [place], RETROPERITNL ABD,  LTD - EKG 12-Lead   15. Former smoker  - [place], RETROPERITNL ABD,  LTD - EKG 12-Lead   16. Screening for AAA (aortic abdominal aneurysm)  - [place], RETROPERITNL ABD,  LTD   17. Medication management  - Urinalysis, Routine w reflex microscopic - Microalbumin / creatinine urine ratio - CBC with Differential/Platelet - COMPLETE METABOLIC PANEL WITH GFR - Magnesium - Lipid panel - TSH - Hemoglobin A1c - Insulin, random - VITAMIN D 25 Hydroxy           Patient was counseled in prudent diet, weight control to achieve/maintain BMI less than 25, BP monitoring, regular exercise and medications as discussed.  Discussed med effects and SE's. Routine screening labs and tests  as requested with regular follow-up as recommended. Over 40 minutes of exam, counseling, chart review and high complex critical decision making was performed   Marinus Maw, MD

## 2023-06-06 ENCOUNTER — Ambulatory Visit (INDEPENDENT_AMBULATORY_CARE_PROVIDER_SITE_OTHER): Payer: Medicare HMO | Admitting: Internal Medicine

## 2023-06-06 ENCOUNTER — Encounter: Payer: Self-pay | Admitting: Internal Medicine

## 2023-06-06 ENCOUNTER — Other Ambulatory Visit: Payer: Self-pay

## 2023-06-06 VITALS — BP 138/74 | HR 62 | Temp 98.0°F | Ht 69.0 in | Wt 166.6 lb

## 2023-06-06 DIAGNOSIS — I739 Peripheral vascular disease, unspecified: Secondary | ICD-10-CM | POA: Diagnosis not present

## 2023-06-06 DIAGNOSIS — Z0001 Encounter for general adult medical examination with abnormal findings: Secondary | ICD-10-CM

## 2023-06-06 DIAGNOSIS — N138 Other obstructive and reflux uropathy: Secondary | ICD-10-CM

## 2023-06-06 DIAGNOSIS — K2100 Gastro-esophageal reflux disease with esophagitis, without bleeding: Secondary | ICD-10-CM

## 2023-06-06 DIAGNOSIS — I2510 Atherosclerotic heart disease of native coronary artery without angina pectoris: Secondary | ICD-10-CM

## 2023-06-06 DIAGNOSIS — R7309 Other abnormal glucose: Secondary | ICD-10-CM

## 2023-06-06 DIAGNOSIS — N401 Enlarged prostate with lower urinary tract symptoms: Secondary | ICD-10-CM | POA: Diagnosis not present

## 2023-06-06 DIAGNOSIS — Z125 Encounter for screening for malignant neoplasm of prostate: Secondary | ICD-10-CM | POA: Diagnosis not present

## 2023-06-06 DIAGNOSIS — J209 Acute bronchitis, unspecified: Secondary | ICD-10-CM

## 2023-06-06 DIAGNOSIS — R6889 Other general symptoms and signs: Secondary | ICD-10-CM | POA: Diagnosis not present

## 2023-06-06 DIAGNOSIS — E559 Vitamin D deficiency, unspecified: Secondary | ICD-10-CM | POA: Diagnosis not present

## 2023-06-06 DIAGNOSIS — Z1152 Encounter for screening for COVID-19: Secondary | ICD-10-CM | POA: Diagnosis not present

## 2023-06-06 DIAGNOSIS — N1831 Chronic kidney disease, stage 3a: Secondary | ICD-10-CM

## 2023-06-06 DIAGNOSIS — I10 Essential (primary) hypertension: Secondary | ICD-10-CM | POA: Diagnosis not present

## 2023-06-06 DIAGNOSIS — E782 Mixed hyperlipidemia: Secondary | ICD-10-CM | POA: Diagnosis not present

## 2023-06-06 DIAGNOSIS — Z8249 Family history of ischemic heart disease and other diseases of the circulatory system: Secondary | ICD-10-CM

## 2023-06-06 DIAGNOSIS — I700 Atherosclerosis of aorta: Secondary | ICD-10-CM

## 2023-06-06 DIAGNOSIS — Z136 Encounter for screening for cardiovascular disorders: Secondary | ICD-10-CM

## 2023-06-06 DIAGNOSIS — Z79899 Other long term (current) drug therapy: Secondary | ICD-10-CM

## 2023-06-06 DIAGNOSIS — Z87891 Personal history of nicotine dependence: Secondary | ICD-10-CM

## 2023-06-06 DIAGNOSIS — G4733 Obstructive sleep apnea (adult) (pediatric): Secondary | ICD-10-CM

## 2023-06-06 DIAGNOSIS — Z1211 Encounter for screening for malignant neoplasm of colon: Secondary | ICD-10-CM

## 2023-06-06 LAB — POC COVID19 BINAXNOW: SARS Coronavirus 2 Ag: NEGATIVE

## 2023-06-06 MED ORDER — DEXAMETHASONE 4 MG PO TABS: ORAL_TABLET | ORAL | 0 refills | Status: DC

## 2023-06-06 MED ORDER — AZITHROMYCIN 250 MG PO TABS
ORAL_TABLET | ORAL | 1 refills | Status: DC
Start: 2023-06-06 — End: 2023-07-09

## 2023-06-06 MED ORDER — PANTOPRAZOLE SODIUM 40 MG PO TBEC: DELAYED_RELEASE_TABLET | ORAL | 3 refills | Status: AC

## 2023-06-06 MED ORDER — PROMETHAZINE-DM 6.25-15 MG/5ML PO SYRP: ORAL_SOLUTION | ORAL | 1 refills | Status: DC

## 2023-06-06 MED ORDER — BENZONATATE 200 MG PO CAPS: ORAL_CAPSULE | ORAL | 1 refills | Status: DC

## 2023-06-07 ENCOUNTER — Encounter: Payer: Self-pay | Admitting: Internal Medicine

## 2023-06-07 NOTE — Progress Notes (Signed)
<>*<>*<>*<>*<>*<>*<>*<>*<>*<>*<>*<>*<>*<>*<>*<>*<>*<>*<>*<>*<>*<>*<>*<>*<> <>*<>*<>*<>*<>*<>*<>*<>*<>*<>*<>*<>*<>*<>*<>*<>*<>*<>*<>*<>*<>*<>*<>*<>*<>  - Chol = 103  - Wonderful   -    Excellent   - Very low risk for Heart Attack  / Stroke  <>*<>*<>*<>*<>*<>*<>*<>*<>*<>*<>*<>*<>*<>*<>*<>*<>*<>*<>*<>*<>*<>*<>*<>*<> <>*<>*<>*<>*<>*<>*<>*<>*<>*<>*<>*<>*<>*<>*<>*<>*<>*<>*<>*<>*<>*<>*<>*<>*<>  -  A1c = 6.6%  Still in DIABETIC  Range !    Being diabetic has a  300% increased risk for heart attack,                                     stroke, cancer, and alzheimer- type vascular dementia.   It is very important that you work harder with diet by  avoiding all foods that are white except chicken,   fish & calliflower.  - Avoid white rice  (brown & wild rice is OK),   - Avoid white potatoes  (sweet potatoes in moderation is OK),   White bread or wheat bread or anything made out of   white flour like bagels, donuts, rolls, buns, biscuits, cakes,  - pastries, cookies, pizza crust, and pasta (made from white flour & egg whites)   - vegetarian pasta or spinach or wheat pasta is OK.  - Multigrain breads like Arnold's, Pepperidge Farm or                                                             multigrain sandwich thins or high fiber breads like   Eureka bread or "Dave's Killer" breads that are 4 to 5 grams fiber per slice !  are best.    Diet, exercise and weight loss can reverse and cure diabetes in the early stages.    - Diet, exercise and weight loss is very important in the   control and prevention of complications of diabetes which  affects every system in your body, ie.   -Brain - dementia/stroke,  - eyes - glaucoma/blindness,  - heart - heart attack/heart failure,  - kidneys - dialysis,  - stomach - gastric paralysis,  - intestines - malabsorption,  - nerves - severe painful neuritis,  - circulation - gangrene & loss of a leg(s)  - and finally  . . . . . . . . . . . . .  . . . . .    - cancer and Alzheimers.  <>*<>*<>*<>*<>*<>*<>*<>*<>*<>*<>*<>*<>*<>*<>*<>*<>*<>*<>*<>*<>*<>*<>*<>*<>  - Cinnamkon supplements have been shown to lower Blood sugar & A1c  !   So  - Recommend "Naturebell"  on [employer] Cinnamon                             Dose is 2 capsules = 9,000 mg                              240 capsules  is 4 month supply for $16.95   is about $4 / month  !  <>*<>*<>*<>*<>*<>*<>*<>*<>*<>*<>*<>*<>*<>*<>*<>*<>*<>*<>*<>*<>*<>*<>*<>*<>  -  Also taking Apple cider Vinegar   1 Tablespoonful  ( = 1/2 oz)  In water or any other liquid   2 x/ day helps lower blood sugar   <>*<>*<>*<>*<>*<>*<>*<>*<>*<>*<>*<>*<>*<>*<>*<>*<>*<>*<>*<>*<>*<>*<>*<>*<> <>*<>*<>*<>*<>*<>*<>*<>*<>*<>*<>*<>*<>*<>*<>*<>*<>*<>*<>*<>*<>*<>*<>*<>*<>  -  PSA - Low  - No Prostate Cancer  - Great   <>*<>*<>*<>*<>*<>*<>*<>*<>*<>*<>*<>*<>*<>*<>*<>*<>*<>*<>*<>*<>*<>*<>*<>*<> <>*<>*<>*<>*<>*<>*<>*<>*<>*<>*<>*<>*<>*<>*<>*<>*<>*<>*<>*<>*<>*<>*<>*<>*<>  -  Vitamin D = 78 - Excellent  - Please keep dosage same  !   <>*<>*<>*<>*<>*<>*<>*<>*<>*<>*<>*<>*<>*<>*<>*<>*<>*<>*<>*<>*<>*<>*<>*<>*<> <>*<>*<>*<>*<>*<>*<>*<>*<>*<>*<>*<>*<>*<>*<>*<>*<>*<>*<>*<>*<>*<>*<>*<>*<>  - All Else - CBC - Kidneys - Electrolytes - Liver - Magnesium & Thyroid    - all  Normal / OK  <>*<>*<>*<>*<>*<>*<>*<>*<>*<>*<>*<>*<>*<>*<>*<>*<>*<>*<>*<>*<>*<>*<>*<>*<> <>*<>*<>*<>*<>*<>*<>*<>*<>*<>*<>*<>*<>*<>*<>*<>*<>*<>*<>*<>*<>*<>*<>*<>*<>

## 2023-06-09 LAB — CBC WITH DIFFERENTIAL/PLATELET
Absolute Monocytes: 704 {cells}/uL (ref 200–950)
Basophils Absolute: 28 {cells}/uL (ref 0–200)
Basophils Relative: 0.5 %
Eosinophils Absolute: 171 {cells}/uL (ref 15–500)
Eosinophils Relative: 3.1 %
HCT: 44.3 % (ref 38.5–50.0)
Hemoglobin: 14.4 g/dL (ref 13.2–17.1)
Lymphs Abs: 1370 {cells}/uL (ref 850–3900)
MCH: 29.4 pg (ref 27.0–33.0)
MCHC: 32.5 g/dL (ref 32.0–36.0)
MCV: 90.6 fL (ref 80.0–100.0)
MPV: 10.4 fL (ref 7.5–12.5)
Monocytes Relative: 12.8 %
Neutro Abs: 3229 {cells}/uL (ref 1500–7800)
Neutrophils Relative %: 58.7 %
Platelets: 250 10*3/uL (ref 140–400)
RBC: 4.89 10*6/uL (ref 4.20–5.80)
RDW: 12.8 % (ref 11.0–15.0)
Total Lymphocyte: 24.9 %
WBC: 5.5 10*3/uL (ref 3.8–10.8)

## 2023-06-09 LAB — COMPLETE METABOLIC PANEL WITH GFR
AG Ratio: 1.8 (calc) (ref 1.0–2.5)
ALT: 24 U/L (ref 9–46)
AST: 20 U/L (ref 10–35)
Albumin: 4.1 g/dL (ref 3.6–5.1)
Alkaline phosphatase (APISO): 110 U/L (ref 35–144)
BUN: 17 mg/dL (ref 7–25)
CO2: 29 mmol/L (ref 20–32)
Calcium: 9.5 mg/dL (ref 8.6–10.3)
Chloride: 102 mmol/L (ref 98–110)
Creat: 0.99 mg/dL (ref 0.70–1.35)
Globulin: 2.3 g/dL (ref 1.9–3.7)
Glucose, Bld: 97 mg/dL (ref 65–99)
Potassium: 4.8 mmol/L (ref 3.5–5.3)
Sodium: 139 mmol/L (ref 135–146)
Total Bilirubin: 0.4 mg/dL (ref 0.2–1.2)
Total Protein: 6.4 g/dL (ref 6.1–8.1)
eGFR: 82 mL/min/{1.73_m2} (ref >=60)

## 2023-06-09 LAB — URINALYSIS, ROUTINE W REFLEX MICROSCOPIC
Bilirubin Urine: NEGATIVE
Glucose, UA: NEGATIVE
Hgb urine dipstick: NEGATIVE
Ketones, ur: NEGATIVE
Leukocytes,Ua: NEGATIVE
Nitrite: NEGATIVE
Protein, ur: NEGATIVE
Specific Gravity, Urine: 1.01 (ref 1.001–1.035)
pH: 5.5 (ref 5.0–8.0)

## 2023-06-09 LAB — LIPID PANEL
Cholesterol: 103 mg/dL (ref <200)
HDL: 39 mg/dL — ABNORMAL LOW (ref >=40)
LDL Cholesterol (Calc): 43 mg/dL
Non-HDL Cholesterol (Calc): 64 mg/dL (ref <130)
Total CHOL/HDL Ratio: 2.6 (calc) (ref <5.0)
Triglycerides: 127 mg/dL (ref <150)

## 2023-06-09 LAB — INSULIN, RANDOM: Insulin: 12.4 u[IU]/mL

## 2023-06-09 LAB — PSA: PSA: 1.41 ng/mL (ref <=4.00)

## 2023-06-09 LAB — PARATHYROID HORMONE, INTACT (NO CA): PTH: 39 pg/mL (ref 16–77)

## 2023-06-09 LAB — VITAMIN D 25 HYDROXY (VIT D DEFICIENCY, FRACTURES): Vit D, 25-Hydroxy: 78 ng/mL (ref 30–100)

## 2023-06-09 LAB — MICROALBUMIN / CREATININE URINE RATIO
Creatinine, Urine: 60 mg/dL (ref 20–320)
Microalb Creat Ratio: 3 mg/g{creat} (ref <30)
Microalb, Ur: 0.2 mg/dL

## 2023-06-09 LAB — HEMOGLOBIN A1C
Hgb A1c MFr Bld: 6.6 %{Hb} — ABNORMAL HIGH (ref <5.7)
Mean Plasma Glucose: 143 mg/dL
eAG (mmol/L): 7.9 mmol/L

## 2023-06-09 LAB — TSH: TSH: 1.01 m[IU]/L (ref 0.40–4.50)

## 2023-06-09 LAB — MAGNESIUM: Magnesium: 2 mg/dL (ref 1.5–2.5)

## 2023-07-01 DIAGNOSIS — Z0100 Encounter for examination of eyes and vision without abnormal findings: Secondary | ICD-10-CM | POA: Diagnosis not present

## 2023-07-01 DIAGNOSIS — H5203 Hypermetropia, bilateral: Secondary | ICD-10-CM | POA: Diagnosis not present

## 2023-07-02 DIAGNOSIS — G4733 Obstructive sleep apnea (adult) (pediatric): Secondary | ICD-10-CM | POA: Diagnosis not present

## 2023-07-09 ENCOUNTER — Other Ambulatory Visit: Payer: Self-pay

## 2023-07-09 ENCOUNTER — Emergency Department (HOSPITAL_COMMUNITY): Payer: Medicare HMO

## 2023-07-09 ENCOUNTER — Emergency Department (HOSPITAL_COMMUNITY)
Admission: EM | Admit: 2023-07-09 | Discharge: 2023-07-09 | Discharge status: Against Medical Advice | Payer: Medicare HMO | Attending: Emergency Medicine | Admitting: Emergency Medicine

## 2023-07-09 DIAGNOSIS — U071 COVID-19: Secondary | ICD-10-CM | POA: Diagnosis not present

## 2023-07-09 DIAGNOSIS — R072 Precordial pain: Secondary | ICD-10-CM | POA: Insufficient documentation

## 2023-07-09 DIAGNOSIS — R0609 Other forms of dyspnea: Secondary | ICD-10-CM | POA: Diagnosis not present

## 2023-07-09 DIAGNOSIS — R079 Chest pain, unspecified: Secondary | ICD-10-CM | POA: Diagnosis not present

## 2023-07-09 DIAGNOSIS — Z79899 Other long term (current) drug therapy: Secondary | ICD-10-CM | POA: Insufficient documentation

## 2023-07-09 DIAGNOSIS — R918 Other nonspecific abnormal finding of lung field: Secondary | ICD-10-CM | POA: Diagnosis not present

## 2023-07-09 DIAGNOSIS — I25118 Atherosclerotic heart disease of native coronary artery with other forms of angina pectoris: Secondary | ICD-10-CM

## 2023-07-09 DIAGNOSIS — I2510 Atherosclerotic heart disease of native coronary artery without angina pectoris: Secondary | ICD-10-CM | POA: Diagnosis not present

## 2023-07-09 DIAGNOSIS — R058 Other specified cough: Secondary | ICD-10-CM | POA: Diagnosis not present

## 2023-07-09 DIAGNOSIS — I10 Essential (primary) hypertension: Secondary | ICD-10-CM | POA: Diagnosis not present

## 2023-07-09 DIAGNOSIS — R0602 Shortness of breath: Secondary | ICD-10-CM | POA: Diagnosis present

## 2023-07-09 LAB — RESP PANEL BY RT-PCR (RSV, FLU A&B, COVID)  RVPGX2
Influenza A by PCR: NEGATIVE
Influenza B by PCR: NEGATIVE
Resp Syncytial Virus by PCR: NEGATIVE
SARS Coronavirus 2 by RT PCR: POSITIVE — AB

## 2023-07-09 LAB — CBC
HCT: 45.8 % (ref 39.0–52.0)
Hemoglobin: 15 g/dL (ref 13.0–17.0)
MCH: 29.5 pg (ref 26.0–34.0)
MCHC: 32.8 g/dL (ref 30.0–36.0)
MCV: 90.2 fL (ref 80.0–100.0)
Platelets: 260 10*3/uL (ref 150–400)
RBC: 5.08 MIL/uL (ref 4.22–5.81)
RDW: 14.2 % (ref 11.5–15.5)
WBC: 4.6 10*3/uL (ref 4.0–10.5)
nRBC: 0 % (ref 0.0–0.2)

## 2023-07-09 LAB — BASIC METABOLIC PANEL
Anion gap: 9 (ref 5–15)
BUN: 11 mg/dL (ref 8–23)
CO2: 26 mmol/L (ref 22–32)
Calcium: 9.3 mg/dL (ref 8.9–10.3)
Chloride: 106 mmol/L (ref 98–111)
Creatinine, Ser: 0.92 mg/dL (ref 0.61–1.24)
GFR, Estimated: >60 mL/min (ref >60)
Glucose, Bld: 97 mg/dL (ref 70–99)
Potassium: 4.2 mmol/L (ref 3.5–5.1)
Sodium: 141 mmol/L (ref 135–145)

## 2023-07-09 LAB — TROPONIN I (HIGH SENSITIVITY)
Troponin I (High Sensitivity): 7 ng/L (ref <18)
Troponin I (High Sensitivity): 8 ng/L (ref <18)

## 2023-07-09 MED ORDER — CARVEDILOL 12.5 MG PO TABS
12.5000 mg | ORAL_TABLET | Freq: Once | ORAL | Status: AC
  Administered 2023-07-09: 12.5 mg via ORAL
  Filled 2023-07-09: qty 1

## 2023-07-09 MED ORDER — LISINOPRIL 10 MG PO TABS
10.0000 mg | ORAL_TABLET | Freq: Once | ORAL | Status: AC
  Administered 2023-07-09: 10 mg via ORAL
  Filled 2023-07-09: qty 1

## 2023-07-09 MED ORDER — HYDRALAZINE HCL 20 MG/ML IJ SOLN: 10.0000 mg | INTRAMUSCULAR | Status: DC | PRN

## 2023-07-09 MED ORDER — CARVEDILOL 12.5 MG PO TABS: 12.5000 mg | ORAL_TABLET | Freq: Two times a day (BID) | ORAL | Status: DC

## 2023-07-09 NOTE — ED Notes (Signed)
PT requested water.  The PA stated it was ok via secure chat.  Pt was brought water.

## 2023-07-09 NOTE — ED Provider Notes (Signed)
Care assumed from previous provider.  See note for full HPI.  In summation 72 year old here for chest pain and cough.  Cough and shortness of breath x 2 weeks.  He called his cardiologist at [facility] who scheduled him for a heart cath on Monday, 3 days from today.  This morning developed chest pain and they recommend he come to the nearest emergency department.  States he was negative for COVID and flu at PCP office a few weeks ago.  Previous provider that saw patient had consulted with cardiology here.  Plan for them to see patient to determine disposition.  Patient with delta troponins flat, chest x-ray shows possible pneumonia versus bronchitis.  His COVID test is positive.  He is significantly hypertensive, previous provider had given him a blood pressure medication.  Plan on follow-up on consult with cards for disposition. Physical Exam  BP (!) 178/80   Pulse 68   Temp 97.8 F (36.6 C) (Oral)   Resp 14   SpO2 94%   Physical Exam Vitals and nursing note reviewed.  Constitutional:      General: He is not in acute distress.    Appearance: He is well-developed. He is not ill-appearing or diaphoretic.  HENT:     Head: Atraumatic.  Eyes:     Pupils: Pupils are equal, round, and reactive to light.  Cardiovascular:     Rate and Rhythm: Normal rate and regular rhythm.  Pulmonary:     Effort: Pulmonary effort is normal. No respiratory distress.  Abdominal:     General: There is no distension.     Palpations: Abdomen is soft.  Musculoskeletal:        General: Normal range of motion.     Cervical back: Normal range of motion and neck supple.  Skin:    General: Skin is warm and dry.  Neurological:     General: No focal deficit present.     Mental Status: He is alert and oriented to person, place, and time.     Procedures  Procedures Labs Reviewed  RESP PANEL BY RT-PCR (RSV, FLU A&B, COVID)  RVPGX2 - Abnormal; Notable for the following components:      Result Value   SARS Coronavirus  2 by RT PCR POSITIVE (*)    All other components within normal limits  BASIC METABOLIC PANEL  CBC  TROPONIN I (HIGH SENSITIVITY)  TROPONIN I (HIGH SENSITIVITY)   DG Chest 2 View  Result Date: 07/09/2023 CLINICAL DATA:  Two to three-week history productive cough, shortness of breath, and chest pain EXAM: CHEST - 2 VIEW COMPARISON:  Chest radiograph dated 05/05/2020 FINDINGS: Normal lung volumes. Patchy left lower lobe opacity. Bilateral lower lobe peribronchial wall thickening. No pleural effusion or pneumothorax. The heart size and mediastinal contours are within normal limits. No acute osseous abnormality. IMPRESSION: 1. Patchy left lower lobe opacity, suspicious for pneumonia. 2. Bilateral lower lobe peribronchial wall thickening, which can be seen in the setting of bronchitis. Electronically Signed   By: Agustin Cree M.D.   On: 07/09/2023 13:03    ED Course / MDM   Clinical Course as of 07/09/23 1707  Wed Jul 09, 2023  1509 2-3 weeks with SOB and cough. Cath planned on Monday. Cards to see [BH]    Clinical Course User Index [BH] Naasir Carreira A, PA-C   Care assumed from previous provider.  See note for full HPI.  In summation 72 year old here for chest pain and cough.  Cough and shortness of breath  x 2 weeks.  He called his cardiologist at [facility] who scheduled him for a heart cath on Monday, 3 days from today.  This morning developed chest pain and they recommend he come to the nearest emergency department.  States he was negative for COVID and flu at PCP office a few weeks ago.  Previous provider that saw patient had consulted with cardiology here.  Plan for them to see patient to determine disposition.  Patient with delta troponins flat, chest x-ray shows possible pneumonia versus bronchitis.  His COVID test is positive.  He is significantly hypertensive, previous provider had given him a blood pressure medication.  Plan on follow-up on consult with cards for disposition.  Labs and  imaging personally viewed and interpreted   Cards has placed BP meds for HTN.  Patient seen by cardiology, See their note.  They do not feel he needs admission at this time from a cardiology perspective.  I went and assessed patient at bedside.  He states he is ready to leave.  I discussed possible infection on his chest x-ray, given COVID I recommended CTA chest to r/o PE as a cause of his right sided chest pain.  Patient declined.  We discussed risk versus benefit.  Patient voiced understanding and states he will follow-up outpatient.  I encouraged him to return for new or worsening symptoms as well as continued follow-up with his [facility] cardiologist.  I discussed possible additional medications for his blood pressure which he declined as well.  States he would follow-up with his cardiology for this as well.  Patient leaving against medical advice  We discussed the nature and purpose, risks and benefits, as well as, the alternatives of treatment. Time was given to allow the opportunity to ask questions and consider their options, and after the discussion, the patient decided to refuse the offerred treatment. The patient was informed that refusal could lead to, but was not limited to, death, permanent disability, or severe pain. If present, I asked the relatives or significant others to dissuade them without success. Prior to refusing, I determined that the patient had the capacity to make their decision and understood the consequences of that decision. After refusal, I made every reasonable opportunity to treat them to the best of my ability.  The patient was notified that they may return to the emergency department at any time for further treatment.      Medical Decision Making Amount and/or Complexity of Data Reviewed Independent Historian: friend External Data Reviewed: labs, radiology, ECG and notes. Labs: ordered. Decision-making details documented in ED Course. Radiology: ordered and  independent interpretation performed. Decision-making details documented in ED Course. ECG/medicine tests: ordered and independent interpretation performed. Decision-making details documented in ED Course.  Risk OTC drugs. Prescription drug management. Parenteral controlled substances. Decision regarding hospitalization. Diagnosis or treatment significantly limited by social determinants of health.          Rilyn Scroggs A, PA-C 07/09/23 1707    Lonell Grandchild, MD 07/11/23 [phone]

## 2023-07-09 NOTE — Consult Note (Cosign Needed)
Cardiology Consultation   Patient ID: Patrick Tucker MRN: [id number]; DOB: 12/30/1950  Admit date: 07/09/2023 Date of Consult: 07/09/2023  PCP:  Patrick Cowboy, MD   [facility] ProvidersCardiologist:  N/a     He is followed by Dr. Elyn Tucker from [facility] cardiology.  Patient Profile:   Patrick Tucker is a 72 y.o. male with a hx of hypertension, HFrecEF, peripheral arterial disease, coronary artery disease, OSA on CPAP, HLD, pre-diabetes, Vitamin D deficiency, and a history of tobacco abuse who is being seen 07/09/2023 for the evaluation of chest pain at the request of Dr. Jearld Tucker.  History of Present Illness:   Patrick Tucker has significant cardiac history, has been followed by [facility] cardiology. STEMI in 2015 with stenting to the marginal branch that was occluded and a subtotally occluded non-dominnat RCA. He was found to have moderate disease in the left anterior descending. The OM was opened up at that time. In 2021, he stopped much of his medications and he then presented to [facility] with an acute STEMI and was taken emergently to the lab where he received PCI to a 95% lesion in the LAD and a 95% lesion in OM just proximal to the previously placed stent. Also noticed was a diffuse 90% stenosis in a small non-dominant RCA. Last OV in February of 2024 with no chest pain, shortness of breath, pain in legs.   On 07/01/23, patient called his cardiologist to report new symptoms of dyspnea and cough x2 weeks and outpatient LHC arranged for 10/14. Patient gets regular exercise and has noticed increased need to stop and take breaks due to shortness of breath. He denies any exertional angina. Today patient had onset of right side chest discomfort and was advised to proceed to nearest ED by his cardiologist. Per patient, he tested negative for COVID and influenza 3 weeks ago with his PCP.  The chest pain is right-sided, reproducible, and not consistent with his prior angina which she had at the  time of his recent MI and in the past.  On exam in the ED, patient reports that today is the first time that he has felt chest discomfort since progressive dyspnea began. He clarifies that chest pain is right sided only and worsens with coughing or deep breaths. He says that this pain feels completely different from previous MI.   Past Medical History:  Diagnosis Date   Hyperlipidemia    Hypertension 2010   Myocardial infarction [facility]) 2015   Prediabetes 2015   ST elevation myocardial infarction (STEMI) [facility])     Past Surgical History:  Procedure Laterality Date   CORONARY STENT INTERVENTION N/A 05/05/2020   Procedure: CORONARY STENT INTERVENTION;  Surgeon: Patrick Bihari, MD;  Location: [facility] INVASIVE CV LAB;  Service: Cardiovascular;  Laterality: N/A;   CORONARY/GRAFT ACUTE MI REVASCULARIZATION N/A 05/05/2020   Procedure: Coronary/Graft Acute MI Revascularization;  Surgeon: Patrick Bihari, MD;  Location: [facility] INVASIVE CV LAB;  Service: Cardiovascular;  Laterality: N/A;   LEFT HEART CATH AND CORONARY ANGIOGRAPHY N/A 05/05/2020   Procedure: LEFT HEART CATH AND CORONARY ANGIOGRAPHY;  Surgeon: Patrick Bihari, MD;  Location: [facility] INVASIVE CV LAB;  Service: Cardiovascular;  Laterality: N/A;     Home Medications:  Prior to Admission medications   Medication Sig Start Date End Date Taking? Authorizing Provider  ascorbic acid (VITAMIN C) 1000 MG tablet Take 1,000 mg by mouth daily. 02/17/15  Yes [provider]  atorvastatin (LIPITOR) 80 MG tablet Take 1 tablet by  mouth daily. 04/29/23  Yes [provider]  carvedilol (COREG) 12.5 MG tablet Take 12.5 mg by mouth 2 (two) times daily with a meal. 04/29/23 04/23/24 Yes [provider]  Cholecalciferol (VITAMIN D-3) 125 MCG (5000 UT) TABS Take 1 tablet by mouth daily.   Yes [provider]  clopidogrel (PLAVIX) 75 MG tablet Take   1 tablet   Daily     to Prevent Blood Clots 08/01/20  Yes Patrick Cowboy, MD  ezetimibe  (ZETIA) 10 MG tablet Take 1 tablet by mouth daily. 05/08/23 05/07/24 Yes [provider]  lisinopril (ZESTRIL) 10 MG tablet Take 1 tablet daily for BP 11/26/22  Yes Patrick Cowboy, MD  Multiple Vitamin (MULTIVITAMIN) tablet Take 1 tablet by mouth daily.   Yes [provider]  pantoprazole (PROTONIX) 40 MG tablet Take  1 tablet  Daily  to Prevent Indigestion & Heartburn 06/06/23  Yes Patrick Cowboy, MD  vitamin E 45 MG (100 UNITS) capsule Take 100 Units by mouth daily.   Yes [provider]    Inpatient Medications: Scheduled Meds:  carvedilol  12.5 mg Oral BID WC   lisinopril  10 mg Oral Once   Continuous Infusions:  PRN Meds:   Allergies:    Allergies  Allergen Reactions   Coreg [Carvedilol] Hives and Itching   Atorvastatin     Myalgia    Social History:   Social History   Socioeconomic History   Marital status: Married    Spouse name: sarah   Number of children: 1   Years of education: Not on file   Highest education level: Not on file  Occupational History   Not on file  Tobacco Use   Smoking status: Former    Current packs/day: 0.00    Types: Cigarettes    Quit date: 2000    Years since quitting: 24.7   Smokeless tobacco: Never  Vaping Use   Vaping status: Never Used  Substance and Sexual Activity   Alcohol use: Not Currently   Drug use: Not on file   Sexual activity: Not on file  Other Topics Concern   Not on file  Social History Narrative   Not on file   Social Determinants of Health   Financial Resource Strain: Not on file  Food Insecurity: Not on file  Transportation Needs: Not on file  Physical Activity: Not on file  Stress: Not on file  Social Connections: Not on file  Intimate Partner Violence: Not on file    Family History:    Family History  Problem Relation Age of Onset   Cancer Mother    Hypertension Father    Heart disease Father    Diabetes Sister    Stroke Brother    COPD Brother    Hypertension  Brother    COPD Brother      ROS:  Please see the history of present illness.   All other ROS reviewed and negative.     Physical Exam/Data:   Vitals:   07/09/23 1345 07/09/23 1400 07/09/23 1415 07/09/23 1430  BP: (!) 183/93 (!) 184/87 (!) 187/89 (!) 190/84  Pulse: 85 (!) 54 (!) 55 (!) 53  Resp: 18 13 13 15   Temp:      TempSrc:      SpO2: 97% 98% 98% 98%   No intake or output data in the 24 hours ending 07/09/23 1500    06/06/2023   10:05 AM 02/14/2023   10:30 AM 01/20/2023    3:23  PM  Last 3 Weights  Weight (lbs) 166 lb 9.6 oz 167 lb 9.6 oz 167 lb 12.8 oz  Weight (kg) 75.569 kg 76.023 kg 76.114 kg     There is no height or weight on file to calculate BMI.  General:  Well nourished, well developed, in no acute distress HEENT: normal Neck: no JVD Vascular: No carotid bruits; Distal pulses 2+ bilaterally Cardiac:  normal S1, S2; RRR; no murmur  Lungs:  faint inspiratory wheezing Abd: soft, nontender, no hepatomegaly  Ext: no edema Musculoskeletal:  No deformities, BUE and BLE strength normal and equal Skin: warm and dry  Neuro:  CNs 2-12 intact, no focal abnormalities noted Psych:  Normal affect   EKG:  The EKG was personally reviewed and demonstrates:  sinus bradycardia with no acute ischemic changes.  Telemetry:  Telemetry was personally reviewed and demonstrates:  sinus rhythm  Relevant CV Studies:   05/06/20 TTE  IMPRESSIONS     1. Definity contrast did not reveal any evidence of apical thrombi. .  Left ventricular ejection fraction, by estimation, is 30 to 35%. The left  ventricle has moderately decreased function. The left ventricle has no  regional wall motion abnormalities.  Left ventricular diastolic parameters are consistent with Grade II  diastolic dysfunction (pseudonormalization). There is severe akinesis of  the left ventricular, mid-apical anteroseptal wall.   2. Right ventricular systolic function is normal. The right ventricular  size is  normal. There is normal pulmonary artery systolic pressure.   3. The mitral valve is grossly normal. Trivial mitral valve  regurgitation. No evidence of mitral stenosis.   4. The aortic valve is tricuspid. Aortic valve regurgitation is not  visualized. No aortic stenosis is present.   FINDINGS   Left Ventricle: Definity contrast did not reveal any evidence of apical  thrombi. Left ventricular ejection fraction, by estimation, is 30 to 35%.  The left ventricle has moderately decreased function. The left ventricle  has no regional wall motion  abnormalities. Severe akinesis of the left ventricular, mid-apical  anteroseptal wall. Definity contrast agent was given IV to delineate the  left ventricular endocardial borders. The left ventricular internal cavity  size was normal in size. There is no  left ventricular hypertrophy. Left ventricular diastolic parameters are  consistent with Grade II diastolic dysfunction (pseudonormalization).   Right Ventricle: The right ventricular size is normal. No increase in  right ventricular wall thickness. Right ventricular systolic function is  normal. There is normal pulmonary artery systolic pressure. The tricuspid  regurgitant velocity is 2.65 m/s, and   with an assumed right atrial pressure of 3 mmHg, the estimated right  ventricular systolic pressure is 31.1 mmHg.   Left Atrium: Left atrial size was normal in size.   Right Atrium: Right atrial size was normal in size.   Pericardium: There is no evidence of pericardial effusion.   Mitral Valve: The mitral valve is grossly normal. Trivial mitral valve  regurgitation. No evidence of mitral valve stenosis. MV peak gradient, 2.7  mmHg. The mean mitral valve gradient is 1.0 mmHg.   Tricuspid Valve: The tricuspid valve is grossly normal. Tricuspid valve  regurgitation is trivial.   Aortic Valve: The aortic valve is tricuspid. Aortic valve regurgitation is  not visualized. No aortic stenosis is  present. Aortic valve mean gradient  measures 2.0 mmHg. Aortic valve peak gradient measures 4.2 mmHg. Aortic  valve area, by VTI measures 2.24  cm.   Pulmonic Valve: The pulmonic valve was grossly normal. Pulmonic  valve  regurgitation is not visualized.   Aorta: The aortic root and ascending aorta are structurally normal, with  no evidence of dilitation.   IAS/Shunts: The atrial septum is grossly normal.    05/05/20 LHC  1st Mrg-1 lesion is 95% stenosed. Previously placed 1st Mrg-2 stent (unknown type) is widely patent. Prox LAD to Mid LAD lesion is 95% stenosed. Post intervention, there is a 0% residual stenosis. Prox RCA to Mid RCA lesion is 90% stenosed. Post intervention, there is a 0% residual stenosis. A stent was successfully placed. A stent was successfully placed.   Transient ST elevation MI/acute coronary syndrome involving two culprit vessels with a 95% proximal LAD stenosis in a large LAD system which supplies almost the entire inferior wall and a 95% near ostial stenosis in the circumflex marginal vessel just proximal to the previously placed stent;  75% diffuse AV groove circumflex stenosis and the RCA is a nondominant vessel with diffuse 90% mid stenosis.   Successful two-vessel coronary intervention following the LAD with ultimate insertion of a 3.0 x 30 mm Resolute Onyx DES stent postdilated to 3.21 mm with the 95% stenosis and diffuse irregularity beyond the stenosis being reduced to 0%, and successful PCI/DES stenting of the 95% stenosis just beyond the ostium and immediately proximal to the previously placed marginal stent with insertion of a 2.5 x 12 mm Resolute Onyx DES stent postdilated to 2.6 mm with the 95% stenosis being reduced to 0%.   Elevated LVEDP at 33 mm.   RECOMMENDATION: DAPT for minimum of 1 year.  Medical therapy for concomitant CAD involving the AV groove circumflex and nondominant RCA.  Aggressive lipid-lowering therapy with target LDL less than  70.  2D echo Doppler study will be obtained in a.m.  Diagnostic Dominance: Right      Intervention     Laboratory Data:  High Sensitivity Troponin:   Recent Labs  Lab 07/09/23 1136 07/09/23 1332  TROPONINIHS 7 8     Chemistry Recent Labs  Lab 07/09/23 1136  NA 141  K 4.2  CL 106  CO2 26  GLUCOSE 97  BUN 11  CREATININE 0.92  CALCIUM 9.3  GFRNONAA >60  ANIONGAP 9    No results for input(s): "PROT", "ALBUMIN", "AST", "ALT", "ALKPHOS", "BILITOT" in the last 168 hours. Lipids No results for input(s): "CHOL", "TRIG", "HDL", "LABVLDL", "LDLCALC", "CHOLHDL" in the last 168 hours.  Hematology Recent Labs  Lab 07/09/23 1136  WBC 4.6  RBC 5.08  HGB 15.0  HCT 45.8  MCV 90.2  MCH 29.5  MCHC 32.8  RDW 14.2  PLT 260   Thyroid No results for input(s): "TSH", "FREET4" in the last 168 hours.  BNPNo results for input(s): "BNP", "PROBNP" in the last 168 hours.  DDimer No results for input(s): "DDIMER" in the last 168 hours.   Radiology/Studies:  DG Chest 2 View  Result Date: 07/09/2023 CLINICAL DATA:  Two to three-week history productive cough, shortness of breath, and chest pain EXAM: CHEST - 2 VIEW COMPARISON:  Chest radiograph dated 05/05/2020 FINDINGS: Normal lung volumes. Patchy left lower lobe opacity. Bilateral lower lobe peribronchial wall thickening. No pleural effusion or pneumothorax. The heart size and mediastinal contours are within normal limits. No acute osseous abnormality. IMPRESSION: 1. Patchy left lower lobe opacity, suspicious for pneumonia. 2. Bilateral lower lobe peribronchial wall thickening, which can be seen in the setting of bronchitis. Electronically Signed   By: Agustin Cree M.D.   On: 07/09/2023 13:03  Assessment and Plan:   CAD Accelerating Angina  Patient with hx STEMI in 2015 with occluded LCX and anterior STEMI August 2021 with 95% stenosis of LAD and 95% stenosis of LCX proximal to prior stent. Successful PCI of both vessels. Small caliber  RCA with 90% stenosis not treated. Patient initially on Brilinta but transitioned to Plavix. Patient with stable cardiac symptom since 2021 until recently developing progressive dyspnea.  HS troponin negative x2 in the ED today. ECG non-ischemic. Dyspnea symptoms consistent with COVID-19 although with duration of symptoms, difficult to say whether COVID-19 is the only cause. With his CAD history, LHC/RHC is very reasonable but with negative ACS workup today, no indication for acute evaluation or admission.  Recommend outpatient follow up with patient's cardiologist. Continue Atorvastatin 80mg , Zetia 10mg  Continue Coreg 12.5mg  BID  Hypertension  Patient with significant hypertension in the ED today, up to 211/86 mmHg. He was given Coreg 12.5mg  at 1518, BP improved but still up in the 170s systolic.   Hydralazine 10mg  IV x1 dose ordered for acute hypertension. Could consider adding Amlodipine and hydrochlorothiazide for BP management if Coreg dose not able to be increased due to rate limitations.   PAD  History of bilateral SFA disease with previous left SFA intervention.   No acute symtpoms, continue home Plavix regimen  HFrecEF  TTE 12/08/20 with LVEF >55%. Previously 30-35% in August of 2021 at [facility] in setting of anterior STEMI.  No symptoms or physical exam findings suggestive of CHF. Continue Coreg as above.  Risk Assessment/Risk Scores:         For questions or updates, please contact [facility] Please consult [email or web address] for contact info under    Signed, Perlie Gold, PA-C  07/09/2023 3:00 PM  ATTENDING ATTESTATION  I have seen, examined and evaluated the patient this evening in the emergency room along with Perlie Gold, PA.  After reviewing all the available data and chart, we discussed the patients laboratory, study & physical findings as well as symptoms in detail.  I agree with his findings, examination as well as impression recommendations as per our  discussion.    The complete conversation and exam with performed by the 2 of [place] together.  No real difference in my recommendations but from the note of the above.  Troponins are negative and his chest pain is right-sided reproducible musculoskeletal chest pain not cardiac in nature.  I wonder if his several weeks of progressive dyspnea could be related to COVID although the STEMI a little bit long for effective COVID but did be now positive.  However he does have a scheduled outpatient cath for next week retentions breast will probably be to be delayed until he is clear of COVID.  Not unreasonable to proceed with that plan based on Dr. Elijio Miles recommendations.    Marykay Lex, MD, MS 07/09/2023 3:15 PM  Bryan Lemma, M.D., M.S. Interventional Cardiologist  [facility]  Pager # [phone] Phone # [phone] [address] [place], [place] [address]

## 2023-07-09 NOTE — ED Notes (Signed)
Pts family was made aware that hte PT needed a CT and that his blood pressure was still elevated.  PT was adamant about leaving.  PA, Henderly spoke to the Pt and attempted to get him to stay.

## 2023-07-09 NOTE — ED Provider Notes (Cosign Needed)
[facility] AT [facility] Provider Note   CSN: [id number] Arrival date & time: 07/09/23  7829     History  Chief Complaint  Patient presents with   Chest Pain   Shortness of Breath   Cough    Kahlel Peake is a 72 y.o. male.  72 year old male with past medical history significant for CAD with multiple MIs who presents today for concern of 2 to 3-week duration of shortness of breath with cough, and chest pain that started this morning.  Chest pain is substernal and right-sided.  Does not radiate anywhere else.  Not associated with diaphoresis, palpitations or lightheadedness.  He states he was tested for COVID and flu 3 weeks ago during his annual physical which was negative.  He is due to undergo a cardiac catheterization on Monday due to the shortness of breath.  Denies fever.  Reports compliance with his home medications.  The history is provided by the patient. No language interpreter was used.       Home Medications Prior to Admission medications   Medication Sig Start Date End Date Taking? Authorizing Provider  ascorbic acid (VITAMIN C) 1000 MG tablet Take 1 tablet by mouth daily. 02/17/15   [provider]  azithromycin (ZITHROMAX) 250 MG tablet Take 2 tablets with Food on  Day 1, then 1 tablet Daily with Food for Sinusitis / Bronchitis 06/06/23   Lucky Cowboy, MD  Bempedoic Acid-Ezetimibe (NEXLIZET) 180-10 MG TABS Take  1 tablet  Daily  for Cholesterol 04/15/22   Lucky Cowboy, MD  benzonatate (TESSALON) 200 MG capsule Take 1 perle 3 x / day to prevent cough 06/06/23   Lucky Cowboy, MD  Cholecalciferol (VITAMIN D-3) 125 MCG (5000 UT) TABS Take 1 tablet by mouth daily.    [provider]  clopidogrel (PLAVIX) 75 MG tablet Take   1 tablet   Daily     to Prevent Blood Clots 08/01/20   Lucky Cowboy, MD  dexamethasone (DECADRON) 4 MG tablet Take 1 tab 3 x day - 3 days, then 2 x day - 3 days, then 1 tab daily 06/06/23    Lucky Cowboy, MD  lisinopril (ZESTRIL) 10 MG tablet Take 1 tablet daily for BP 11/26/22   Lucky Cowboy, MD  metoprolol succinate (TOPROL-XL) 25 MG 24 hr tablet Take 1 tablet (25 mg total) by mouth daily. 05/08/20   [facility], Roe Rutherford, PA  nitroGLYCERIN (NITROSTAT) 0.4 MG SL tablet Place 1 tablet (0.4 mg total) under the tongue every 5 (five) minutes as needed for chest pain. 05/07/20 02/14/23  Marcelino Duster, PA  pantoprazole (PROTONIX) 40 MG tablet Take  1 tablet  Daily  to Prevent Indigestion & Heartburn 06/06/23   Lucky Cowboy, MD  promethazine-dextromethorphan (PROMETHAZINE-DM) 6.25-15 MG/5ML syrup Take 1 tsp every 4 hours if needed for cough 06/06/23   Lucky Cowboy, MD      Allergies    Coreg [carvedilol] and Atorvastatin    Review of Systems   Review of Systems  Constitutional:  Negative for chills and fever.  Respiratory:  Positive for cough and shortness of breath.   Cardiovascular:  Positive for chest pain.  Gastrointestinal:  Negative for abdominal pain, nausea and vomiting.  Genitourinary:  Negative for difficulty urinating.  Neurological:  Negative for light-headedness.  All other systems reviewed and are negative.   Physical Exam Updated Vital Signs BP (!) 190/94   Pulse (!) 58   Temp 97.8 F (36.6 C) (Oral)   Resp  13   SpO2 100%  Physical Exam Vitals and nursing note reviewed.  Constitutional:      General: He is not in acute distress.    Appearance: Normal appearance. He is not ill-appearing.  HENT:     Head: Normocephalic and atraumatic.     Nose: Nose normal.  Eyes:     General: No scleral icterus.    Extraocular Movements: Extraocular movements intact.     Conjunctiva/sclera: Conjunctivae normal.  Cardiovascular:     Rate and Rhythm: Normal rate and regular rhythm.     Heart sounds: Normal heart sounds.  Pulmonary:     Effort: Pulmonary effort is normal. No respiratory distress.     Breath sounds: Normal breath sounds. No wheezing or rales.   Abdominal:     General: There is no distension.     Tenderness: There is no abdominal tenderness.  Musculoskeletal:        General: Normal range of motion.     Cervical back: Normal range of motion.     Right lower leg: No edema.     Left lower leg: No edema.  Skin:    General: Skin is warm and dry.  Neurological:     General: No focal deficit present.     Mental Status: He is alert. Mental status is at baseline.     ED Results / Procedures / Treatments   Labs (all labs ordered are listed, but only abnormal results are displayed) Labs Reviewed  RESP PANEL BY RT-PCR (RSV, FLU A&B, COVID)  RVPGX2  BASIC METABOLIC PANEL  CBC  TROPONIN I (HIGH SENSITIVITY)  TROPONIN I (HIGH SENSITIVITY)    EKG EKG Interpretation Date/Time:  Wednesday July 09 2023 11:22:59 EDT Ventricular Rate:  54 PR Interval:  176 QRS Duration:  80 QT Interval:  434 QTC Calculation: 411 R Axis:   66  Text Interpretation: Sinus bradycardia Confirmed by Vivi Barrack [phone]) on 07/09/2023 12:24:07 PM  Radiology No results found.  Procedures Procedures    Medications Ordered in ED Medications - No data to display  ED Course/ Medical Decision Making/ A&P Clinical Course as of 07/09/23 1513  Wed Jul 09, 2023  1509 2-3 weeks with SOB and cough. Cath planned on Monday. Cards to see [BH]    Clinical Course User Index [BH] Henderly, Britni A, PA-C                                 Medical Decision Making Amount and/or Complexity of Data Reviewed Labs: ordered. Radiology: ordered.  Risk Prescription drug management.   Medical Decision Making / ED Course   This patient presents to the ED for concern of chest pain, this involves an extensive number of treatment options, and is a complaint that carries with it a high risk of complications and morbidity.  The differential diagnosis includes ACS, PE, pneumonia, MSK pain, dissection  MDM: 72 year old male with significant past medical history  of CAD with multiple MIs in the past was undergone cardiac catheterization on Monday presents today for concern of chest pain with associated shortness of breath.  Chest pain is nonradiating.  Somewhat improved compared to onset.  No prior history of DVT or PE.  Low risk for PE on Wells criteria.  CBC is unremarkable.  BMP unremarkable.  Initial troponin of 7.  Chest x-ray without acute cardiopulmonary process.  EKG without acute ischemic changes. Currently chest pain free.  Repeat  troponin pending.  Discussed with cardiology.  They will review patient and provide recommendations.  At the end of my shift patient is pending cardiology recommendations.  Signed out to oncoming provider.  Additional history obtained: -Additional history obtained from wife -External records from outside source obtained and reviewed including: Chart review including previous notes, labs, imaging, consultation notes   Lab Tests: -I ordered, reviewed, and interpreted labs.   The pertinent results include:   Labs Reviewed  RESP PANEL BY RT-PCR (RSV, FLU A&B, COVID)  RVPGX2  BASIC METABOLIC PANEL  CBC  TROPONIN I (HIGH SENSITIVITY)  TROPONIN I (HIGH SENSITIVITY)      EKG  EKG Interpretation Date/Time:  Wednesday July 09 2023 11:22:59 EDT Ventricular Rate:  54 PR Interval:  176 QRS Duration:  80 QT Interval:  434 QTC Calculation: 411 R Axis:   66  Text Interpretation: Sinus bradycardia Confirmed by Vivi Barrack [phone]) on 07/09/2023 12:24:07 PM         Imaging Studies ordered: I ordered imaging studies including cxr I independently visualized and interpreted imaging. I agree with the radiologist interpretation   Medicines ordered and prescription drug management: No orders of the defined types were placed in this encounter.   -I have reviewed the patients home medicines and have made adjustments as needed  Consultations Obtained: I requested consultation with the cardiology,  and discussed  lab and imaging findings as well as pertinent plan - they recommend: as above   Cardiac Monitoring: The patient was maintained on a cardiac monitor.  I personally viewed and interpreted the cardiac monitored which showed an underlying rhythm of: sinus rhythm  Co morbidities that complicate the patient evaluation  Past Medical History:  Diagnosis Date   Hyperlipidemia    Hypertension 2010   Myocardial infarction (HCC) 2015   Prediabetes 2015   ST elevation myocardial infarction (STEMI) (HCC)       Dispostion: Signout to oncoming provider pending cardiology recommendations.   Final Clinical Impression(s) / ED Diagnoses Final diagnoses:  Precordial chest pain    Rx / DC Orders ED Discharge Orders     None         Marita Kansas, PA-C 07/09/23 1513

## 2023-07-09 NOTE — ED Notes (Signed)
Pt stated he had not taken his BP medication this morning.

## 2023-07-09 NOTE — ED Triage Notes (Signed)
Pt. Stated, I've had chest pain and SOB for 2 weeks. No other symptoms. Im suppose to have a cardiac cath on Monday, but I've also developed a cough

## 2023-07-09 NOTE — Discharge Instructions (Signed)
We had discussed getting a CT scan here in the emergency department to rule out a blood clot as a cause of your pain which you declined.  I would recommend following up with your [facility] cardiologist, Return to the ED for any worsening symptoms

## 2023-07-21 DIAGNOSIS — J984 Other disorders of lung: Secondary | ICD-10-CM | POA: Diagnosis not present

## 2023-07-21 DIAGNOSIS — J069 Acute upper respiratory infection, unspecified: Secondary | ICD-10-CM | POA: Diagnosis not present

## 2023-07-21 DIAGNOSIS — I272 Pulmonary hypertension, unspecified: Secondary | ICD-10-CM | POA: Diagnosis not present

## 2023-07-21 DIAGNOSIS — Z7902 Long term (current) use of antithrombotics/antiplatelets: Secondary | ICD-10-CM | POA: Diagnosis not present

## 2023-07-21 DIAGNOSIS — I493 Ventricular premature depolarization: Secondary | ICD-10-CM | POA: Diagnosis not present

## 2023-07-21 DIAGNOSIS — I2510 Atherosclerotic heart disease of native coronary artery without angina pectoris: Secondary | ICD-10-CM | POA: Diagnosis not present

## 2023-07-21 DIAGNOSIS — Z955 Presence of coronary angioplasty implant and graft: Secondary | ICD-10-CM | POA: Diagnosis not present

## 2023-07-21 DIAGNOSIS — G4733 Obstructive sleep apnea (adult) (pediatric): Secondary | ICD-10-CM | POA: Diagnosis not present

## 2023-07-21 DIAGNOSIS — I739 Peripheral vascular disease, unspecified: Secondary | ICD-10-CM | POA: Diagnosis not present

## 2023-07-21 DIAGNOSIS — E785 Hyperlipidemia, unspecified: Secondary | ICD-10-CM | POA: Diagnosis not present

## 2023-07-21 DIAGNOSIS — R0609 Other forms of dyspnea: Secondary | ICD-10-CM | POA: Diagnosis not present

## 2023-07-21 DIAGNOSIS — Z87891 Personal history of nicotine dependence: Secondary | ICD-10-CM | POA: Diagnosis not present

## 2023-07-21 DIAGNOSIS — R0600 Dyspnea, unspecified: Secondary | ICD-10-CM | POA: Diagnosis not present

## 2023-07-21 DIAGNOSIS — I252 Old myocardial infarction: Secondary | ICD-10-CM | POA: Diagnosis not present

## 2023-07-21 DIAGNOSIS — R0789 Other chest pain: Secondary | ICD-10-CM | POA: Diagnosis not present

## 2023-07-21 DIAGNOSIS — R001 Bradycardia, unspecified: Secondary | ICD-10-CM | POA: Diagnosis not present

## 2023-07-21 DIAGNOSIS — Z9989 Dependence on other enabling machines and devices: Secondary | ICD-10-CM | POA: Diagnosis not present

## 2023-07-23 DIAGNOSIS — I2510 Atherosclerotic heart disease of native coronary artery without angina pectoris: Secondary | ICD-10-CM | POA: Diagnosis not present

## 2023-08-01 DIAGNOSIS — G4733 Obstructive sleep apnea (adult) (pediatric): Secondary | ICD-10-CM | POA: Diagnosis not present

## 2023-08-15 DIAGNOSIS — R0602 Shortness of breath: Secondary | ICD-10-CM | POA: Diagnosis not present

## 2023-08-15 DIAGNOSIS — R0989 Other specified symptoms and signs involving the circulatory and respiratory systems: Secondary | ICD-10-CM | POA: Diagnosis not present

## 2023-08-31 DIAGNOSIS — G4733 Obstructive sleep apnea (adult) (pediatric): Secondary | ICD-10-CM | POA: Diagnosis not present

## 2023-09-05 ENCOUNTER — Ambulatory Visit: Payer: Medicare HMO | Admitting: Nurse Practitioner

## 2023-09-09 ENCOUNTER — Ambulatory Visit (INDEPENDENT_AMBULATORY_CARE_PROVIDER_SITE_OTHER): Payer: Medicare HMO | Admitting: Nurse Practitioner

## 2023-09-09 ENCOUNTER — Ambulatory Visit: Payer: Medicare HMO | Admitting: Nurse Practitioner

## 2023-09-09 ENCOUNTER — Encounter: Payer: Self-pay | Admitting: Nurse Practitioner

## 2023-09-09 VITALS — BP 114/76 | HR 55 | Temp 97.8°F | Ht 69.0 in | Wt 168.6 lb

## 2023-09-09 DIAGNOSIS — N138 Other obstructive and reflux uropathy: Secondary | ICD-10-CM

## 2023-09-09 DIAGNOSIS — I10 Essential (primary) hypertension: Secondary | ICD-10-CM

## 2023-09-09 DIAGNOSIS — E782 Mixed hyperlipidemia: Secondary | ICD-10-CM

## 2023-09-09 DIAGNOSIS — Z87891 Personal history of nicotine dependence: Secondary | ICD-10-CM

## 2023-09-09 DIAGNOSIS — I739 Peripheral vascular disease, unspecified: Secondary | ICD-10-CM | POA: Diagnosis not present

## 2023-09-09 DIAGNOSIS — R7309 Other abnormal glucose: Secondary | ICD-10-CM | POA: Diagnosis not present

## 2023-09-09 DIAGNOSIS — N401 Enlarged prostate with lower urinary tract symptoms: Secondary | ICD-10-CM

## 2023-09-09 DIAGNOSIS — G720 Drug-induced myopathy: Secondary | ICD-10-CM | POA: Diagnosis not present

## 2023-09-09 DIAGNOSIS — E559 Vitamin D deficiency, unspecified: Secondary | ICD-10-CM | POA: Diagnosis not present

## 2023-09-09 DIAGNOSIS — Z6823 Body mass index (BMI) 23.0-23.9, adult: Secondary | ICD-10-CM | POA: Diagnosis not present

## 2023-09-09 DIAGNOSIS — I509 Heart failure, unspecified: Secondary | ICD-10-CM

## 2023-09-09 DIAGNOSIS — Z79899 Other long term (current) drug therapy: Secondary | ICD-10-CM | POA: Diagnosis not present

## 2023-09-09 DIAGNOSIS — G4733 Obstructive sleep apnea (adult) (pediatric): Secondary | ICD-10-CM | POA: Diagnosis not present

## 2023-09-09 DIAGNOSIS — T466X5A Adverse effect of antihyperlipidemic and antiarteriosclerotic drugs, initial encounter: Secondary | ICD-10-CM

## 2023-09-09 DIAGNOSIS — N1831 Chronic kidney disease, stage 3a: Secondary | ICD-10-CM

## 2023-09-09 NOTE — Progress Notes (Signed)
FOLLOW UP Assessment:   Diagnoses and all orders for this visit:  PAD (peripheral artery disease) (HCC) Continue atorvastatin, ezetimibe Control blood pressure, cholesterol, glucose, increase exercise.  Cardiology follows Dr. Harland German Discussed lifestyle modifications. Recommended diet heavy in fruits and veggies, omega 3's. Decrease consumption of animal meats, cheeses, and dairy products. Remain active and exercise as tolerated. Continue to monitor. Check lipids/TSH  CHF [facility]) Cardiology following MI, EF improved at recheck  Appears euvolemic Disease process and medications discussed.  Emphasized salt restriction, less than 2000mg  a day. Encouraged daily monitoring of the patient's weight, call office if 3 lb weight loss or gain in a day.  Encouraged regular exercise. If any increasing shortness of breath, swelling, or chest pressure go to ER immediately.   Essential hypertension Continue Carvedilol, Lisinopril Discussed DASH (Dietary Approaches to Stop Hypertension) DASH diet is lower in sodium than a typical American diet. Cut back on foods that are high in saturated fat, cholesterol, and trans fats. Eat more whole-grain foods, fish, poultry, and nuts Remain active and exercise as tolerated daily.  Monitor BP at home-Call if greater than 130/80.  Check CMP/CBC  Hyperlipidemia, unspecified hyperlipidemia type/Statin Myopathy Continue atorvastatin, ezetimibe  Discussed lifestyle modifications. Recommended diet heavy in fruits and veggies, omega 3's. Decrease consumption of animal meats, cheeses, and dairy products. Remain active and exercise as tolerated. Continue to monitor. Check lipids/TSH  Former smoker Quit over 15 years ago; denies sx; monitor  Follows Dr. Maple Hudson, Pulmonology   Vitamin D deficiency Recently at goal  Continue to recommend supplementation for goal of 60-100 Defer vitamin D level to next OV  OSA On CPAP with restorative sleep Leak resolved  04/2022 Continue to follow with Pulmonology as directed  BMI 23 Discussed appropriate BMI Diet modification. Physical activity. Encouraged/praised to build confidence.  Medication Management All medications discussed and reviewed in full. All questions and concerns regarding medications addressed.    Prediabetes Education: Reviewed 'ABCs' of diabetes management  Discussed goals to be met and/or maintained include A1C (<7) Blood pressure (<130/80) Cholesterol (LDL <70) Continue Eye Exam yearly  Continue Dental Exam Q6 mo Discussed dietary recommendations Discussed Physical Activity recommendations Check A1C  Stage 3a CKD Discussed how what you eat and drink can aide in kidney protection. Stay well hydrated. Avoid high salt foods. Avoid NSAIDS. Keep BP and BG well controlled.   Take medications as prescribed. Remain active and exercise as tolerated daily. Maintain weight.  Continue to monitor. Check CMP/GFR/Microablumin  BPH with symptoms Continue to monitor PSA.  Orders Placed This Encounter  Procedures   CBC with Differential/Platelet   COMPLETE METABOLIC PANEL WITH GFR   Lipid panel   Hemoglobin A1c   Notify office for further evaluation and treatment, questions or concerns if any reported s/s fail to improve.   The patient was advised to call back or seek an in-person evaluation if any symptoms worsen or if the condition fails to improve as anticipated.   Further disposition pending results of labs. Discussed med's effects and SE's.    I discussed the assessment and treatment plan with the patient. The patient was provided an opportunity to ask questions and all were answered. The patient agreed with the plan and demonstrated an understanding of the instructions.  Discussed med's effects and SE's. Screening labs and tests as requested with regular follow-up as recommended.  I provided 40 minutes of face-to-face time during this encounter including counseling,  chart review, and critical decision making was preformed.   Future Appointments  Date Time Provider Department Center  12/04/2023 11:00 AM Adela Glimpse, NP [facility] None  01/20/2024  3:30 PM Waymon Budge, MD [facility] None  03/05/2024 11:30 AM Lucky Cowboy, MD [facility] None  06/15/2024 10:00 AM Lucky Cowboy, MD [facility] None     Subjective:  Patrick Tucker is a 72 y.o. male who presents for a 3 month follow up for HTN, hyperlipidemia, prediabetes, and vitamin D Def.   Overall he reports feeling well today.  He has no new or additional concerns to report.    He was seen in the ED 07/09/23 for c/o CP.  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  Work up was overall negative.  EKG was SB, negative troponins, CXR did show possible PNA versus bronchitis.  His test for Covid was positive.  Was treated with abx - completed course.  He then followed up with Cardioloy, Dr. Harland German.  He continues to SOB most often DOE.  He has a hx of CAD.  HE had a heart catheterizion 07/21/23 without blockage.  He followed up with Dr. Harland German  08/15/23.  History includes presented with an ST segment elevation myocardial infarction in 2015. He had stenting to the marginal branch that was occluded and a subtotally occluded non-dominnat RCA. He was found to have moderate disease in the left anterior descending. The OM was opened up at that time. Continues Plavix.  Unfortunately, in 2021, he stopped much of his medications and he then presented to [facility] with an acute STEMI and was taken emergently to the lab where he received PCI to a 95% lesion in the LAD and a 95% lesion in OM just proximal to the previously placed stent. Also noticed was a diffuse 90% stenosis in a small non-dominant RCA.  He was unable to afford Brilinta and additionally question if his shortness of breath is from Brilinta and was switched to Plavix.  He has been doing alright recently but still has  dyspnea   He is pending PFTs, CT Chest and TTE.    He had sleep study 02/2021 showing OSA by Dr. Maple Hudson, recommended CPAP - now on CPAP, reports 100% compliance, continues to report improved restorative sleep.  BMI is Body mass index is 24.9 kg/m., he has been working on diet and exercise, walks 6 days a week. He has been working on [employer] Readings from Last 3 Encounters:  09/09/23 168 lb 9.6 oz (76.5 kg)  06/06/23 166 lb 9.6 oz (75.6 kg)  02/14/23 167 lb 9.6 oz (76 kg)   His blood pressure has been controlled at home, today their BP is BP: 114/76 He does workout. He denies chest pain, shortness of breath, does report dizziness with position changes.   He is on cholesterol medication. Continues with Nexlizet d/t statin myopathy. His cholesterol is at goal of LDL <70. The cholesterol last visit was:   Lab Results  Component Value Date   CHOL 103 06/06/2023   HDL 39 (L) 06/06/2023   LDLCALC 43 06/06/2023   TRIG 127 06/06/2023   CHOLHDL 2.6 06/06/2023   He has been working on diet and exercise for DM controlled by lifestyle, and denies hypoglycemia , increased appetite, nausea, paresthesia of the feet, polydipsia, polyuria, visual disturbances and vomiting. Last A1C in the office was:  Lab Results  Component Value Date   HGBA1C 6.6 (H) 06/06/2023   He has CKD III as been pushing water intake. Last GFR Lab Results  Component  Value Date   GFRAA 49 (L) 03/28/2021   Lab Results  Component Value Date   CREATININE 0.92 07/09/2023   CREATININE 0.99 06/06/2023   CREATININE 1.03 02/14/2023   Patient is on Vitamin D supplement Lab Results  Component Value Date   VD25OH 78 06/06/2023      Medication Review:   Current Outpatient Medications (Cardiovascular):    atorvastatin (LIPITOR) 80 MG tablet, Take 1 tablet by mouth daily.   carvedilol (COREG) 12.5 MG tablet, Take 12.5 mg by mouth 2 (two) times daily with a meal.   ezetimibe (ZETIA) 10 MG tablet, Take 1 tablet by mouth  daily.   lisinopril (ZESTRIL) 10 MG tablet, Take 1 tablet daily for BP    Current Outpatient Medications (Hematological):    clopidogrel (PLAVIX) 75 MG tablet, Take   1 tablet   Daily     to Prevent Blood Clots  Current Outpatient Medications (Other):    ascorbic acid (VITAMIN C) 1000 MG tablet, Take 1,000 mg by mouth daily.   Cholecalciferol (VITAMIN D-3) 125 MCG (5000 UT) TABS, Take 1 tablet by mouth daily.   Multiple Vitamin (MULTIVITAMIN) tablet, Take 1 tablet by mouth daily.   pantoprazole (PROTONIX) 40 MG tablet, Take  1 tablet  Daily  to Prevent Indigestion & Heartburn   vitamin E 45 MG (100 UNITS) capsule, Take 100 Units by mouth daily.  Allergies: Allergies  Allergen Reactions   Coreg [Carvedilol] Hives and Itching   Atorvastatin     Myalgia    Current Problems (verified) has Hypertension; Hyperlipidemia; Vitamin D deficiency; Former smoker; CAD (coronary artery disease); PAD (peripheral artery disease) (HCC); Chronic heart failure (HCC); OSA (obstructive sleep apnea); Elevated CK; Myalgia; BMI 23.0-23.9, adult; and Anemia on their problem list.  Screening Tests Immunization History  Administered Date(s) Administered   PFIZER(Purple Top)SARS-COV-2 Vaccination 11/04/2019, 11/25/2019, 07/09/2020, 04/21/2021   Pfizer Covid-19 Vaccine Bivalent Booster 28yrs & up 08/04/2021   Tetanus 09/30/2014    Patient Care Team: Lucky Cowboy, MD as PCP - General (Internal Medicine) Lucky Cowboy, MD as PCP - Internal Medicine (Internal Medicine) Lennette Bihari, MD as PCP - Cardiology (Cardiology)  Surgical: He  has a past surgical history that includes Coronary/Graft Acute MI Revascularization (N/A, 05/05/2020); LEFT HEART CATH AND CORONARY ANGIOGRAPHY (N/A, 05/05/2020); and CORONARY STENT INTERVENTION (N/A, 05/05/2020). Family His family history includes COPD in his brother and brother; Cancer in his mother; Diabetes in his sister; Heart disease in his father; Hypertension in his  brother and father; Stroke in his brother. Social history  He reports that he quit smoking about 24 years ago. His smoking use included cigarettes. He has never used smokeless tobacco. He reports that he does not currently use alcohol. No history on file for drug use.   Objective:   Today's Vitals   09/09/23 1055  BP: 114/76  Pulse: (!) 55  Temp: 97.8 F (36.6 C)  SpO2: 98%  Weight: 168 lb 9.6 oz (76.5 kg)  Height: 5\' 9"  (1.753 m)   Body mass index is 24.9 kg/m.  General appearance: alert, no distress, WD/WN, male HEENT: normocephalic, sclerae anicteric, TMs pearly, nares patent, no discharge or erythema, pharynx normal Oral cavity: MMM, no lesions Neck: supple, no lymphadenopathy, no thyromegaly, no masses Heart: RRR, normal S1, S2, no murmurs Lungs: CTA bilaterally, no wheezes, rhonchi, or rales Abdomen: +bs, soft, non tender, non distended, no masses, no hepatomegaly, no splenomegaly Musculoskeletal: nontender, no swelling, no obvious deformity Extremities: no edema, no cyanosis, no clubbing  Pulses:  Symmetric, intact, upper and lower extremities, normal cap refill Neurological: alert, oriented x 3, CN2-12 intact, strength normal upper extremities and lower extremities, sensation normal throughout, DTRs 2+ throughout, no cerebellar signs, gait normal Psychiatric: normal affect, behavior normal, pleasant    Adela Glimpse, NP   09/09/2023

## 2023-09-09 NOTE — Patient Instructions (Signed)

## 2023-09-10 LAB — COMPLETE METABOLIC PANEL WITH GFR
AG Ratio: 2 (calc) (ref 1.0–2.5)
ALT: 41 U/L (ref 9–46)
AST: 26 U/L (ref 10–35)
Albumin: 4.5 g/dL (ref 3.6–5.1)
Alkaline phosphatase (APISO): 120 U/L (ref 35–144)
BUN: 19 mg/dL (ref 7–25)
CO2: 30 mmol/L (ref 20–32)
Calcium: 9.8 mg/dL (ref 8.6–10.3)
Chloride: 100 mmol/L (ref 98–110)
Creat: 1.18 mg/dL (ref 0.70–1.28)
Globulin: 2.2 g/dL (ref 1.9–3.7)
Glucose, Bld: 96 mg/dL (ref 65–99)
Potassium: 4.5 mmol/L (ref 3.5–5.3)
Sodium: 139 mmol/L (ref 135–146)
Total Bilirubin: 0.6 mg/dL (ref 0.2–1.2)
Total Protein: 6.7 g/dL (ref 6.1–8.1)
eGFR: 66 mL/min/{1.73_m2} (ref >=60)

## 2023-09-10 LAB — CBC WITH DIFFERENTIAL/PLATELET
Absolute Lymphocytes: 1866 {cells}/uL (ref 850–3900)
Absolute Monocytes: 611 {cells}/uL (ref 200–950)
Basophils Absolute: 28 {cells}/uL (ref 0–200)
Basophils Relative: 0.6 %
Eosinophils Absolute: 150 {cells}/uL (ref 15–500)
Eosinophils Relative: 3.2 %
HCT: 42.6 % (ref 38.5–50.0)
Hemoglobin: 14 g/dL (ref 13.2–17.1)
MCH: 30.2 pg (ref 27.0–33.0)
MCHC: 32.9 g/dL (ref 32.0–36.0)
MCV: 91.8 fL (ref 80.0–100.0)
MPV: 10.5 fL (ref 7.5–12.5)
Monocytes Relative: 13 %
Neutro Abs: 2045 {cells}/uL (ref 1500–7800)
Neutrophils Relative %: 43.5 %
Platelets: 202 10*3/uL (ref 140–400)
RBC: 4.64 10*6/uL (ref 4.20–5.80)
RDW: 13.1 % (ref 11.0–15.0)
Total Lymphocyte: 39.7 %
WBC: 4.7 10*3/uL (ref 3.8–10.8)

## 2023-09-10 LAB — LIPID PANEL
Cholesterol: 105 mg/dL (ref <200)
HDL: 40 mg/dL (ref >=40)
LDL Cholesterol (Calc): 47 mg/dL
Non-HDL Cholesterol (Calc): 65 mg/dL (ref <130)
Total CHOL/HDL Ratio: 2.6 (calc) (ref <5.0)
Triglycerides: 99 mg/dL (ref <150)

## 2023-09-10 LAB — HEMOGLOBIN A1C
Hgb A1c MFr Bld: 6.5 %{Hb} — ABNORMAL HIGH (ref <5.7)
Mean Plasma Glucose: 140 mg/dL
eAG (mmol/L): 7.7 mmol/L

## 2023-09-19 DIAGNOSIS — R0602 Shortness of breath: Secondary | ICD-10-CM | POA: Diagnosis not present

## 2023-09-19 DIAGNOSIS — R0989 Other specified symptoms and signs involving the circulatory and respiratory systems: Secondary | ICD-10-CM | POA: Diagnosis not present

## 2023-09-19 DIAGNOSIS — I2510 Atherosclerotic heart disease of native coronary artery without angina pectoris: Secondary | ICD-10-CM | POA: Diagnosis not present

## 2023-09-19 DIAGNOSIS — R918 Other nonspecific abnormal finding of lung field: Secondary | ICD-10-CM | POA: Diagnosis not present

## 2023-09-19 DIAGNOSIS — K449 Diaphragmatic hernia without obstruction or gangrene: Secondary | ICD-10-CM | POA: Diagnosis not present

## 2023-09-29 DIAGNOSIS — R0989 Other specified symptoms and signs involving the circulatory and respiratory systems: Secondary | ICD-10-CM | POA: Diagnosis not present

## 2023-09-29 DIAGNOSIS — R0602 Shortness of breath: Secondary | ICD-10-CM | POA: Diagnosis not present

## 2023-09-30 DIAGNOSIS — R0600 Dyspnea, unspecified: Secondary | ICD-10-CM | POA: Diagnosis not present

## 2023-10-20 ENCOUNTER — Other Ambulatory Visit: Payer: Self-pay | Admitting: Internal Medicine

## 2023-12-04 ENCOUNTER — Ambulatory Visit: Payer: Medicare HMO | Admitting: Nurse Practitioner

## 2023-12-04 ENCOUNTER — Encounter: Payer: Self-pay | Admitting: *Deleted

## 2023-12-25 NOTE — Progress Notes (Signed)
 Former patient of Dr. Oneta Rack records are in [facility]

## 2023-12-29 ENCOUNTER — Ambulatory Visit (INDEPENDENT_AMBULATORY_CARE_PROVIDER_SITE_OTHER): Admitting: Family Medicine

## 2023-12-29 ENCOUNTER — Encounter: Payer: Self-pay | Admitting: Family Medicine

## 2023-12-29 VITALS — BP 112/80 | HR 76 | Temp 97.5°F | Ht 69.0 in | Wt 170.8 lb

## 2023-12-29 DIAGNOSIS — N182 Chronic kidney disease, stage 2 (mild): Secondary | ICD-10-CM | POA: Diagnosis not present

## 2023-12-29 DIAGNOSIS — R0609 Other forms of dyspnea: Secondary | ICD-10-CM | POA: Insufficient documentation

## 2023-12-29 DIAGNOSIS — Z7689 Persons encountering health services in other specified circumstances: Secondary | ICD-10-CM

## 2023-12-29 DIAGNOSIS — G4733 Obstructive sleep apnea (adult) (pediatric): Secondary | ICD-10-CM | POA: Diagnosis not present

## 2023-12-29 DIAGNOSIS — R140 Abdominal distension (gaseous): Secondary | ICD-10-CM | POA: Insufficient documentation

## 2023-12-29 DIAGNOSIS — I5042 Chronic combined systolic (congestive) and diastolic (congestive) heart failure: Secondary | ICD-10-CM | POA: Diagnosis not present

## 2023-12-29 DIAGNOSIS — I739 Peripheral vascular disease, unspecified: Secondary | ICD-10-CM

## 2023-12-29 DIAGNOSIS — I10 Essential (primary) hypertension: Secondary | ICD-10-CM

## 2023-12-29 DIAGNOSIS — I2510 Atherosclerotic heart disease of native coronary artery without angina pectoris: Secondary | ICD-10-CM

## 2023-12-29 DIAGNOSIS — E782 Mixed hyperlipidemia: Secondary | ICD-10-CM

## 2023-12-29 DIAGNOSIS — Z59868 Other specified financial insecurity: Secondary | ICD-10-CM | POA: Insufficient documentation

## 2023-12-29 DIAGNOSIS — Z5986 Financial insecurity: Secondary | ICD-10-CM | POA: Insufficient documentation

## 2023-12-29 MED ORDER — LISINOPRIL-HYDROCHLOROTHIAZIDE 20-12.5 MG PO TABS
1.0000 | ORAL_TABLET | Freq: Every day | ORAL | 3 refills | Status: AC
Start: 2023-12-29 — End: ?

## 2023-12-29 NOTE — Progress Notes (Signed)
 Assessment/Plan:   Assessment & Plan Shortness of Breath Intermittent shortness of breath, particularly post-exertion, with no associated chest pain. Previous bronchoscopy and pulmonology consultations at [facility] yielded no definitive diagnosis. History of coronary artery disease and peripheral arterial disease may contribute to symptoms. He is frustrated with lack of concrete answers and has decided to discontinue further appointments without clear direction. - Monitor symptoms and follow up in three months - Review previous pulmonology records to avoid redundant testing  Coronary Artery Disease Coronary artery disease with myocardial infarctions in 2015 and 2020. Currently on Plavix for antiplatelet therapy. No current chest pain. Uses smartwatch for EKG monitoring, though its diagnostic capabilities are limited. - Continue Plavix - Monitor for any new symptoms of chest pain  Peripheral Arterial Disease Peripheral arterial disease with claudication symptoms. Experiences pain when walking but manages it by walking regularly to promote collateral circulation. Advised by previous vascular doctor to continue walking to encourage development of new veins. - Encourage continued walking to promote collateral circulation  Hypertension On carvedilol and lisinopril-hydrochlorothiazide for blood pressure management. Blood pressure well-controlled at 112/80 mmHg. Recent medication changes in November or December to include hydrochlorothiazide. - Continue current antihypertensive regimen - Update medication list to reflect lisinopril-hydrochlorothiazide  Hyperlipidemia On atorvastatin and ezetimibe for hyperlipidemia management. Compliant with medication regimen. - Continue atorvastatin and ezetimibe  Chronic Kidney Disease Stage 3 Chronic kidney disease with GFR of 66 as of October last year. Condition is being monitored. - Continue monitoring kidney function  Bloating Persistent bloating for  about three years, with sensation of fullness and gas. Previous gastroenterology workup did not identify a cause. Differential diagnosis includes small intestinal bacterial overgrowth (SIBO) or other gastrointestinal issues. Concerned about impact on eating habits and overall comfort. - Monitor symptoms and follow up in three months - Review previous gastroenterology records to avoid redundant testing      Medications Discontinued During This Encounter  Medication Reason   lisinopril (ZESTRIL) 10 MG tablet     Return in about 3 months (around 03/29/2024) for fasting labs, BP, HLD, bloating .    Subjective:   Encounter date: 12/29/2023  Patrick Tucker is a 73 y.o. male who has Hypertension; Hyperlipidemia; Vitamin D deficiency; Former smoker; CAD (coronary artery disease); PAD (peripheral artery disease) (HCC); Chronic heart failure (HCC); OSA (obstructive sleep apnea); Elevated CK; Myalgia; BMI 23.0-23.9, adult; Anemia; Dyspnea on exertion; Financial insecurity due to medical expenses; Abdominal bloating; and Stage 2 chronic kidney disease on their problem list..   He  has a past medical history of Hyperlipidemia, Hypertension (2010), Myocardial infarction (HCC) (2015), Prediabetes (2015), and ST elevation myocardial infarction (STEMI) (HCC).Marland Kitchen   He presents with chief complaint of Establish Care .   Discussed the use of AI scribe software for clinical note transcription with the patient, who gave verbal consent to proceed.  History of Present Illness Patrick Gee "PB" is a 73 year old male with coronary artery disease and peripheral arterial disease who presents to establish care.  He experiences intermittent dyspnea, particularly during physical activities such as carrying water or mowing the lawn, requiring pauses to catch his breath. A bronchoscopy in January 2025 was inconclusive, and he has consulted multiple doctors without definitive answers, leading to frustration and  discontinuation of further appointments. No dyspnea at rest is reported.  He has a history of coronary artery disease with myocardial infarctions in 2015 and 2020, the first occurring during a football game. He was treated at [facility] and is currently  on Plavix. He denies current angina but mentions that any brief episodes are reported immediately. He uses a watch with EKG capabilities to monitor his heart condition.  He has peripheral arterial disease causing claudication pain when walking. He currently walks three miles daily and exercises regularly at the gym. His medication regimen includes atorvastatin 80 mg and ezetimibe 10 mg daily for hyperlipidemia, and carvedilol 12.5 mg twice daily and lisinopril 10 mg daily for hypertension.  He reports chronic bloating for about three years, affecting his eating habits as he feels full most of the time. He denies heartburn and significant changes in bowel habits but experiences gas and belching. A gastrointestinal workup at [facility] was inconclusive. He is on pantoprazole 40 mg, possibly for gastrointestinal prophylaxis due to his medication regimen.  He has chronic kidney disease stage 3, with stable kidney function as of October 2024, with a GFR of 66.  He is retired and actively looking for under-the-table work to supplement his income. He exercises regularly, going to the gym five days a week, and walks three miles daily. He has been married for 22 years and has a grandson, granddaughter, and a Surveyor, mining. He resides in [place] and is originally from there.       Past Surgical History:  Procedure Laterality Date   CORONARY STENT INTERVENTION N/A 05/05/2020   Procedure: CORONARY STENT INTERVENTION;  Surgeon: Lennette Bihari, MD;  Location: [facility] INVASIVE CV LAB;  Service: Cardiovascular;  Laterality: N/A;   CORONARY/GRAFT ACUTE MI REVASCULARIZATION N/A 05/05/2020   Procedure: Coronary/Graft Acute MI Revascularization;  Surgeon: Lennette Bihari,  MD;  Location: [facility] INVASIVE CV LAB;  Service: Cardiovascular;  Laterality: N/A;   LEFT HEART CATH AND CORONARY ANGIOGRAPHY N/A 05/05/2020   Procedure: LEFT HEART CATH AND CORONARY ANGIOGRAPHY;  Surgeon: Lennette Bihari, MD;  Location: [facility] INVASIVE CV LAB;  Service: Cardiovascular;  Laterality: N/A;    Outpatient Medications Prior to Visit  Medication Sig Dispense Refill   ascorbic acid (VITAMIN C) 1000 MG tablet Take 1,000 mg by mouth daily.     atorvastatin (LIPITOR) 80 MG tablet Take 1 tablet by mouth daily.     carvedilol (COREG) 12.5 MG tablet Take 12.5 mg by mouth 2 (two) times daily with a meal.     Cholecalciferol (VITAMIN D-3) 125 MCG (5000 UT) TABS Take 1 tablet by mouth daily.     clopidogrel (PLAVIX) 75 MG tablet Take   1 tablet   Daily     to Prevent Blood Clots 90 tablet 0   ezetimibe (ZETIA) 10 MG tablet Take 1 tablet by mouth daily.     Multiple Vitamin (MULTIVITAMIN) tablet Take 1 tablet by mouth daily.     pantoprazole (PROTONIX) 40 MG tablet Take  1 tablet  Daily  to Prevent Indigestion & Heartburn 90 tablet 3   vitamin E 45 MG (100 UNITS) capsule Take 100 Units by mouth daily.     lisinopril (ZESTRIL) 10 MG tablet Take 1 tablet by mouth once daily for blood pressure 90 tablet 0   No facility-administered medications prior to visit.    Family History  Problem Relation Age of Onset   Cancer Mother    Hypertension Father    Heart disease Father    Diabetes Sister    Stroke Brother    COPD Brother    Hypertension Brother    COPD Brother     Social History   Socioeconomic History   Marital status: Married    Spouse  name: sarah   Number of children: 1   Years of education: Not on file   Highest education level: Not on file  Occupational History   Not on file  Tobacco Use   Smoking status: Former    Current packs/day: 0.00    Types: Cigarettes    Quit date: 2000    Years since quitting: 25.2   Smokeless tobacco: Never  Vaping Use   Vaping status: Never Used   Substance and Sexual Activity   Alcohol use: Not Currently   Drug use: Not on file   Sexual activity: Not on file  Other Topics Concern   Not on file  Social History Narrative   Not on file   Social Drivers of Health   Financial Resource Strain: Not on file  Food Insecurity: Not on file  Transportation Needs: Not on file  Physical Activity: Not on file  Stress: Not on file  Social Connections: Not on file  Intimate Partner Violence: Not on file                                                                                                  Objective:  Physical Exam: BP 112/80   Pulse 76   Temp (!) 97.5 F (36.4 C) (Temporal)   Ht 5\' 9"  (1.753 m)   Wt 170 lb 12.8 oz (77.5 kg)   SpO2 99%   BMI 25.22 kg/m    Physical Exam Constitutional:      Appearance: Normal appearance.  HENT:     Head: Normocephalic and atraumatic.     Right Ear: Hearing normal.     Left Ear: Hearing normal.     Nose: Nose normal.  Eyes:     General: No scleral icterus.       Right eye: No discharge.        Left eye: No discharge.     Extraocular Movements: Extraocular movements intact.  Cardiovascular:     Rate and Rhythm: Normal rate and regular rhythm.     Heart sounds: Normal heart sounds.  Pulmonary:     Effort: Pulmonary effort is normal.     Breath sounds: Normal breath sounds.  Abdominal:     Palpations: Abdomen is soft.     Tenderness: There is no abdominal tenderness.  Skin:    General: Skin is warm.     Findings: No rash.  Neurological:     General: No focal deficit present.     Mental Status: He is alert.     Cranial Nerves: No cranial nerve deficit.  Psychiatric:        Mood and Affect: Mood normal.        Behavior: Behavior normal.        Thought Content: Thought content normal.        Judgment: Judgment normal.     No results found.  No results found for this or any previous visit (from the past 2160 hours).      Garner Nash, MD, MS

## 2023-12-29 NOTE — Patient Instructions (Signed)
 VISIT SUMMARY:  Today, we discussed your ongoing health concerns, including shortness of breath, coronary artery disease, peripheral arterial disease, hypertension, hyperlipidemia, chronic kidney disease, and bloating. We reviewed your current symptoms, medication regimen, and previous medical evaluations. We also established a plan to monitor and manage your conditions moving forward.  YOUR PLAN:  -SHORTNESS OF BREATH: You experience intermittent shortness of breath, especially after physical activities. This may be related to your history of coronary artery disease and peripheral arterial disease. We will monitor your symptoms and review your previous pulmonology records to avoid redundant testing. Please follow up in three months.  -CORONARY ARTERY DISEASE: Coronary artery disease is a condition where the blood vessels supplying your heart are narrowed or blocked. You have a history of heart attacks and are currently on Plavix to prevent blood clots. Continue taking Plavix and monitor for any new chest pain.  -PERIPHERAL ARTERIAL DISEASE: Peripheral arterial disease is a condition where the blood vessels in your limbs are narrowed, causing pain when walking. Continue walking regularly to promote the development of new veins and improve circulation.  -HYPERTENSION: Hypertension, or high blood pressure, is being managed with carvedilol and lisinopril-hydrochlorothiazide. Your blood pressure is well-controlled. Continue your current medication regimen, and we have updated your medication list to reflect the recent changes.  -HYPERLIPIDEMIA: Hyperlipidemia is a condition with high levels of fats in your blood. You are taking atorvastatin and ezetimibe to manage this. Continue with your current medications.  -CHRONIC KIDNEY DISEASE STAGE 3: Chronic kidney disease is a condition where your kidneys are not functioning at full capacity. Your kidney function is stable, and we will continue to monitor  it.  -BLOATING: You have been experiencing bloating and a sensation of fullness for about three years. Previous evaluations did not identify a cause. We will monitor your symptoms and review your previous gastroenterology records. Please follow up in three months.  INSTRUCTIONS:  Please follow up in three months to monitor your shortness of breath and bloating symptoms. Continue with your current medication regimen and lifestyle habits. If you experience any new symptoms or have concerns, contact our office.

## 2023-12-30 ENCOUNTER — Ambulatory Visit: Payer: Medicare HMO | Admitting: Family Medicine

## 2024-01-02 ENCOUNTER — Telehealth: Payer: Self-pay

## 2024-01-02 NOTE — Progress Notes (Signed)
 Complex Care Management Note  Care Guide Note 01/02/2024 Name: Patrick Tucker MRN: [id number] DOB: March 08, 1951  [name] is a 73 y.o. year old male who sees Garnette Gunner, MD for primary care. I reached out to Wynne Dust by phone today to offer complex care management services.  [name] was given information about Complex Care Management services today including:   The Complex Care Management services include support from the care team which includes your Nurse Care Manager, Clinical Social Worker, or Pharmacist.  The Complex Care Management team is here to help remove barriers to the health concerns and goals most important to you. Complex Care Management services are voluntary, and the patient may decline or stop services at any time by request to their care team member.   Complex Care Management Consent Status: Patient agreed to services and verbal consent obtained.   Follow up plan:  Telephone appointment with complex care management team member scheduled for:  01/07/24 & 01/13/24.  Encounter Outcome:  Patient Scheduled  [facility]  [facility], [facility] Health Care Management Assistant Direct Dial: [phone]  Fax: [phone]

## 2024-01-07 ENCOUNTER — Ambulatory Visit: Payer: Self-pay | Admitting: Licensed Clinical Social Worker

## 2024-01-07 NOTE — Patient Outreach (Signed)
 Complex Care Management   Visit Note  01/07/2024  Name:  Patrick Tucker MRN: [id number] DOB: [birth year]  Situation: Referral received for Complex Care Management related to SDOH Barriers:  N/A  I obtained verbal consent from Patient .  Visit completed with Patient   on the phone  Background:   Past Medical History:  Diagnosis Date   Hyperlipidemia    Hypertension 2010   Myocardial infarction [facility]) 2015   Prediabetes 2015   ST elevation myocardial infarction (STEMI) [facility])     Assessment: Patient Reported Symptoms:  Cognitive    Neurological      HEENT      Cardiovascular      Respiratory      Endocrine      Gastrointestinal      Genitourinary      Integumentary      Musculoskeletal      Psychosocial       There were no vitals filed for this visit.  Medications Reviewed Today   Medications were not reviewed in this encounter     Recommendation:   NO SDOH needs   Follow Up Plan:   Closing From:  Complex Care Management No follow up needed  Jeanie Cooks, PhD [facility], [facility] Social Worker Direct Dial: [phone]  Fax: [phone]

## 2024-01-07 NOTE — Patient Instructions (Signed)
 Visit Information  Thank you for taking time to visit with me today. Please don't hesitate to contact me if I can be of assistance to you before our next scheduled appointment.  Our next appointment is no further scheduled appointments.   Please call the care guide team at [phone] if you need to cancel or reschedule your appointment.   Following is a copy of your care plan:   Goals Addressed             This Visit's Progress    VBCI Social Work Care Plan       Problems:   Patient did not have any SDOH needs at this time    Plan:   No follow up will be done        Please call the Suicide and Crisis Lifeline: 988 go to [facility] [address], [place] [phone]) call 911 if you are experiencing a Mental Health or Behavioral Health Crisis or need someone to talk to.  The patient verbalized understanding of instructions, educational materials, and care plan provided today and DECLINED offer to receive copy of patient instructions, educational materials, and care plan.   Jeanie Cooks, PhD [facility], [facility] Social Worker Direct Dial: [phone]  Fax: [phone]

## 2024-01-13 ENCOUNTER — Other Ambulatory Visit: Payer: Self-pay

## 2024-01-13 NOTE — Patient Instructions (Signed)
 Visit Information  Thank you for taking time to visit with me today. Please don't hesitate to contact me if I can be of assistance to you before our next scheduled appointment.  Our next appointment is by telephone on 01/27/24 at 9:30 Please call the care guide team at [phone] if you need to cancel or reschedule your appointment.   Following is a copy of your care plan:   Goals Addressed             This Visit's Progress    VBCI RN Care Plan       Problems:  Chronic Disease Management support and education needs related to CAD, HLD, and HTN  Goal: Over the next 30 days the Patient will attend all scheduled medical appointments: 01/20/24 Pulmonology, 03/29/24 PCP as evidenced by chart review and patient report        demonstrate Ongoing adherence to prescribed treatment plan for CAD, HLD, and HTN as evidenced by taking all medications as prescribed, attending all medical appointments, completing all ordered lab tests.  Interventions:   CAD Interventions: Assessed understanding of CAD diagnosis Medications reviewed including medications utilized in CAD treatment plan Provided education on importance of blood pressure control in management of CAD Provided education on Importance of limiting foods high in cholesterol Counseled on importance of regular laboratory monitoring as prescribed Reviewed Importance of taking all medications as prescribed Reviewed Importance of attending all scheduled provider appointments Advised to report any changes in symptoms or exercise tolerance Screening for signs and symptoms of depression related to chronic disease state   Hyperlipidemia Interventions: Medication review performed; medication list updated in electronic medical record.  Provider established cholesterol goals reviewed Counseled on importance of regular laboratory monitoring as prescribed Reviewed role and benefits of statin for ASCVD risk reduction Reviewed importance of  limiting foods high in cholesterol Screening for signs and symptoms of depression related to chronic disease state  Hypertension Interventions: Last practice recorded BP readings:  BP Readings from Last 3 Encounters:  12/29/23 112/80  09/09/23 114/76  07/09/23 (!) 172/89   Most recent eGFR/CrCl:  Lab Results  Component Value Date   EGFR 66 09/09/2023    No components found for: "CRCL"  Evaluation of current treatment plan related to hypertension self management and patient's adherence to plan as established by provider Provided education to patient re: stroke prevention, s/s of heart attack and stroke Reviewed medications with patient and discussed importance of compliance Discussed plans with patient for ongoing care management follow up and provided patient with direct contact information for care management team Advised patient, providing education and rationale, to monitor blood pressure daily and record, calling PCP for findings outside established parameters Reviewed scheduled/upcoming provider appointments including:  Provided education on prescribed diet heart healthy low salt low fat Discussed complications of poorly controlled blood pressure such as heart disease, stroke, circulatory complications, vision complications, kidney impairment, sexual dysfunction Screening for signs and symptoms of depression related to chronic disease state   Patient Self-Care Activities:  Attend all scheduled provider appointments Call pharmacy for medication refills 3-7 days in advance of running out of medications Call provider office for new concerns or questions  Perform all self care activities independently  Take medications as prescribed    Plan:  Face to Face appointment with care management team member scheduled for: 01/27/24 at 9:30             Please call the Suicide and Crisis Lifeline: 988 call the [place] National Suicide  Prevention Lifeline: 530-357-8469 or TTY:  [phone] TTY (947)496-4341) to talk to a trained counselor if you are experiencing a Mental Health or Behavioral Health Crisis or need someone to talk to.  Patient verbalizes understanding of instructions and care plan provided today and agrees to view in MyChart. Active MyChart status and patient understanding of how to access instructions and care plan via MyChart confirmed with patient.      Clarnce Crow BSN RN CCM [place]  [facility], [facility] RN Care Manager Direct Dial: [phone] Fax: [phone]

## 2024-01-13 NOTE — Patient Outreach (Signed)
 Complex Care Management   Visit Note  01/13/2024  Name:  Patrick Tucker MRN: [id number] DOB: [birth year]  Situation: Referral received for Complex Care Management related to  CAD, HTN, HLD  I obtained verbal consent from Patient.  Visit completed with patient  on the phone  Background:   Past Medical History:  Diagnosis Date   Hyperlipidemia    Hypertension 2010   Myocardial infarction (HCC) 2015   Prediabetes 2015   ST elevation myocardial infarction (STEMI) (HCC)     Assessment: Patient Reported Symptoms:  Cognitive Cognitive Status: Alert and oriented to person, place, and time      Neurological    No symptoms reported  HEENT HEENT Symptoms Reported: No symptoms reported      Cardiovascular Cardiovascular Symptoms Reported: No symptoms reported Does patient have uncontrolled Hypertension?: No Cardiovascular Conditions: High blood cholesterol, Hypertension (well controlled with medications) Cardiovascular Management Strategies: Medication therapy, Diet modification Weight: 167 lb (75.8 kg) (taken 01/13/24)  Respiratory Respiratory Symptoms Reported: Shortness of breath (with exertion, relieved with short rest periods) Respiratory Conditions: Shortness of breath (pulmonology had no findings)  Endocrine Patient reports the following symptoms related to hypoglycemia or hyperglycemia : No symptoms reported    Gastrointestinal Gastrointestinal Symptoms Reported: Other Other Gastrointestinal Symptoms: gas, bloating full feeling. Additional Gastrointestinal Details: GI provider found no concerns Gastrointestinal Management Strategies: Diet modification, Fluid modification Gastrointestinal Self-Management Outcome: 4 (good) Nutrition Risk Screen (CP): No indicators present  Genitourinary      Integumentary Integumentary Symptoms Reported: No symptoms reported    Musculoskeletal Musculoskelatal Symptoms Reviewed: No symptoms reported, Other Other Musculoskeletal Symptoms: pain  from PAD   Falls in the past year?: No Number of falls in past year: 1 or less    Psychosocial Psychosocial Symptoms Reported: No symptoms reported     Quality of Family Relationships: helpful, involved, supportive Do you feel physically threatened by others?: No      12/29/2023    1:14 PM  Depression screen PHQ 2/9  Decreased Interest 0  Down, Depressed, Hopeless 0  PHQ - 2 Score 0  Altered sleeping 0  Tired, decreased energy 0  Change in appetite 0  Feeling bad or failure about yourself  0  Trouble concentrating 0  Moving slowly or fidgety/restless 0  Suicidal thoughts 0  PHQ-9 Score 0  Difficult doing work/chores Not difficult at all    There were no vitals filed for this visit.  Medications Reviewed Today     Reviewed by Ruel Favors, RN (Registered Nurse) on 01/13/24 at 0957  Med List Status: <None>   Medication Order Taking? Sig Documenting Provider Last Dose Status Informant  ascorbic acid (VITAMIN C) 1000 MG tablet [id number] Yes Take 1,000 mg by mouth daily. [provider] Taking Active Self  atorvastatin (LIPITOR) 80 MG tablet [id number] Yes Take 1 tablet by mouth daily. [provider] Taking Active Self  carvedilol (COREG) 12.5 MG tablet [id number] Yes Take 12.5 mg by mouth 2 (two) times daily with a meal. [provider] Taking Active Self  Cholecalciferol (VITAMIN D-3) 125 MCG (5000 UT) TABS [id number] Yes Take 1 tablet by mouth daily. [provider] Taking Active Self  clopidogrel (PLAVIX) 75 MG tablet [id number] Yes Take   1 tablet   Daily     to Prevent Blood Clots Lucky Cowboy, MD Taking Active Self  ezetimibe (ZETIA) 10 MG tablet [id number] Yes Take 1 tablet by mouth daily. [provider] Taking Active Self  lisinopril-hydrochlorothiazide (ZESTORETIC) 20-12.5 MG tablet  [id number] Yes Take 1 tablet by mouth daily. Catheryn Cluck, MD Taking Active   Multiple Vitamin (MULTIVITAMIN) tablet [id number] Yes Take 1  tablet by mouth daily. [provider] Taking Active Self  pantoprazole (PROTONIX) 40 MG tablet [id number] Yes Take  1 tablet  Daily  to Prevent Indigestion & Heartburn Vangie Genet, MD Taking Active Self  vitamin E 45 MG (100 UNITS) capsule [id number] Yes Take 100 Units by mouth daily. [provider] Taking Active Self            Recommendation:   Follow up with [facility] for Complex Care Management   Follow Up Plan:   Telephone follow up appointment date/time:  01/27/24 at 9:30   Clarnce Crow BSN RN CCM   [facility], [facility] RN Care Manager Direct Dial: [phone] Fax: [phone]

## 2024-01-20 ENCOUNTER — Ambulatory Visit: Admitting: Podiatry

## 2024-01-20 ENCOUNTER — Ambulatory Visit: Payer: Medicare HMO | Admitting: Internal Medicine

## 2024-01-20 ENCOUNTER — Encounter: Payer: Self-pay | Admitting: Physician Assistant

## 2024-01-20 ENCOUNTER — Encounter: Payer: Self-pay | Admitting: Podiatry

## 2024-01-20 ENCOUNTER — Telehealth: Payer: Self-pay | Admitting: Podiatry

## 2024-01-20 VITALS — Ht 69.0 in | Wt 167.0 lb

## 2024-01-20 DIAGNOSIS — L84 Corns and callosities: Secondary | ICD-10-CM

## 2024-01-20 DIAGNOSIS — B351 Tinea unguium: Secondary | ICD-10-CM | POA: Diagnosis not present

## 2024-01-20 DIAGNOSIS — I739 Peripheral vascular disease, unspecified: Secondary | ICD-10-CM

## 2024-01-20 MED ORDER — FLUCONAZOLE 150 MG PO TABS: 150.0000 mg | ORAL_TABLET | ORAL | 0 refills | Status: DC

## 2024-01-20 NOTE — Telephone Encounter (Signed)
 Pharmacist, Royston Cornea called stating prescription for Fluconazole  need clarification on directions. [facility] [address] [phone]

## 2024-01-20 NOTE — Progress Notes (Signed)
 Subjective:  Patient ID: Patrick Tucker, male    DOB: [birth year],  MRN: [id number]  Chief Complaint  Patient presents with   Nail Problem    Pt is here due to broken left great toenail, states he would like to have toenail taken off.   Callouses    Pt would like to have callous removed from the bottom of the bilateral feet.    Discussed the use of AI scribe software for clinical note transcription with the patient, who gave verbal consent to proceed.  History of Present Illness He is a 73 year old male with peripheral artery disease who presents with a callus on the right foot and a painful split nail on the left great toe.  He has a callus on the plantar right medial midfoot, causing discomfort. There is no history of previous treatments for this condition.  He also has a painful split nail on the left great toe, which has been present for some time. The nail exhibits color changes, crumbling, and texture alterations, contributing to discomfort. He wants the nail removed to allow for regrowth without the split. There is no history of treatment for nail fungus.  He has a history of peripheral artery disease, successfully treated with left lower extremity stenting in 2018 at [facility].      Objective:    Physical Exam VASCULAR: Left foot warm and well-perfused. Capillary fill time is brisk. Weakly palpable DP and PT pulse. DERMATOLOGIC: Benign painful skin lesion on plantar right medial midfoot. Normal skin turgor, texture, and temperature. No open lesions, rashes, or ulcerations. NEUROLOGIC: Normal sensation to light touch and pressure. No paresthesias on examination. ORTHOPEDIC: Smooth, pain-free range of motion of all examined joints. No ecchymosis or bruising. No gross deformity. No pain to palpation.   No images are attached to the encounter.    Results Procedure: Removal of left great toenail Description: The left hallux was anesthetized with 1.5 cc of 2% lidocaine  and 0.5%  Marcaine plain. The area was prepared with Betadine and a tourniquet was applied around the base of the toe. The toe was exsanguinated. A Freer elevator was used to remove the entirety of the nail plate. No phenol matrix segment was performed. The tourniquet was released and a sterile bandage with Silvadene, dry sterile gauze, and compression was applied. Informed Consent: Discussed the risks and benefits, including the risk of nonhealing.  Procedure: Debridement of benign skin lesion on the right foot Description: The skin lesion was debrided of hyperkeratosis and the central core enucleated. Salicylic acid treatment was applied. Informed Consent: Discussed that excision of these lesions is not recommended due to the risk of recurrence, painful scarring, and wound formation.  Labs: LFTs: Normal function (08/2023)   Assessment:   1. Onychomycosis   2. Callus of foot   3. PAD (peripheral artery disease) (HCC)      Plan:  Patient was evaluated and treated and all questions answered.  Assessment and Plan Assessment & Plan Onychomycosis Onychomycosis of the left great toe with split nail, color changes, and crumbling texture causing discomfort. No prior treatment for nail fungus. Treatment with oral fluconazole  discussed, considering normal liver function tests from December 2024. Risks and side effects of fluconazole , including gastrointestinal effects, were reviewed. - Prescribe fluconazole  once weekly, pulse dosing for six months. - Reevaluate in six months.  Procedure: Removal of left toenail of the great toe Removal of the left great toenail performed due to discomfort from the split nail. Anesthesia achieved with 1.5  cc each of 2% lidocaine  and 0.5% Marcaine plain. Nail plate removed using a Therapist, nutritional. No phenol matrixectomy performed. Risks including nonhealing were discussed. - Monitor for signs of infection or nonhealing. - Apply sterile bandage with Silvadene and dry sterile  gauze with compression.  Benign skin lesion on right foot Benign appearing painful skin lesion in the plantar right medial midfoot. Debrided of hyperkeratosis and central core enucleated. Excision not recommended due to risk of recurrence, painful scarring, and wound formation. - Sharp debridement was performed today with a #312 blade - Apply salicylic acid treatment and leave on for 24 hours, then remove and wash with soap and water. - Advise home use of salicylic acid treatment as needed.      Return in about 6 months (around 07/21/2024) for follow up after nail fungus treatment.

## 2024-01-20 NOTE — Progress Notes (Deleted)
 HPI M former smoker followed for OSA, complicated by CAD/ MI, HTN, PAD, CHF, Hyperlipidemia, Vit D deficiency NPSG 03/01/21- AHi 5.6/ hr, desaturation to 92%, body weight 161 lbs  ==============================================================   01/20/23-  71 yoM former smoker followed for OSA,  complicated by CAD/ MI, HTN, PAD, CHF, Hyperlipidemia, Vit D deficiency CPAP auto 4-15/ [employer]   ordered 04/05/21 Download-compliance 87%, AHI 3.8/hr Body weight today-167 lbs Download reviewed.  He is meeting compliance and control goals but says the mask is hurting his nose.  He sleeps better and is less tired with CPAP.  01/20/24  72 yoM former smoker followed for OSA,  complicated by CAD/ MI, HTN, PAD, CHF, Hyperlipidemia, Vit D deficiency CPAP auto 4-15/ [facility]   ordered 04/05/21 Download-compliance  Body weight today-  CXR 07/09/23 IMPRESSION: 1. Patchy left lower lobe opacity, suspicious for pneumonia. 2. Bilateral lower lobe peribronchial wall thickening, which can be seen in the setting of bronchitis.     ROS-see HPI   + = positive Constitutional:    weight loss, night sweats, fevers, chills, +fatigue, lassitude. HEENT:    headaches, difficulty swallowing, tooth/dental problems, sore throat,       sneezing, itching, ear ache, nasal congestion, post nasal drip, snoring CV:    chest pain, orthopnea, PND, swelling in lower extremities, anasarca,                                   dizziness, palpitations Resp:   shortness of breath with exertion or at rest.                productive cough,   non-productive cough, coughing up of blood.              change in color of mucus.  wheezing.   Skin:    rash or lesions. GI:  No-   heartburn, indigestion, abdominal pain, nausea, vomiting, diarrhea,                 change in bowel habits, loss of appetite GU: dysuria, change in color of urine, no urgency or frequency.   flank pain. MS:   joint pain, stiffness, decreased range of motion, back pain. Neuro-      nothing unusual Psych:  change in mood or affect.  depression or anxiety.   memory loss.  OBJ- Physical Exam General- Alert, Oriented, Affect-appropriate, Distress- none acute, slender Skin- rash-none, lesions- none, excoriation- none Lymphadenopathy- none Head- atraumatic            Eyes- Gross vision intact, PERRLA, conjunctivae and secretions clear            Ears- Hearing, canals-normal            Nose- + is an irritated area on the bridge of his nose from CPAP mask pressure.            Throat- Mallampati III-IV , mucosa clear , drainage- none, tonsils- atrophic, +teeth Neck- flexible , trachea midline, no stridor , thyroid  nl, carotid no bruit Chest - symmetrical excursion , unlabored           Heart/CV- RRR , no murmur , no gallop  , no rub, nl s1 s2                           - JVD- none , edema- none, stasis changes- none, varices- none  Lung- clear to P&A, wheeze- none, cough- none , dullness-none, rub- none           Chest wall-  Abd-  Br/ Gen/ Rectal- Not done, not indicated Extrem- cyanosis- none, clubbing, none, atrophy- none, strength- nl Neuro- grossly intact to observation

## 2024-01-20 NOTE — Patient Instructions (Addendum)
 Soak Instructions    THE DAY AFTER THE PROCEDURE  Place 1/4 cup of epsom salts (or betadine, or white vinegar) in a quart of warm tap water.  Submerge your foot or feet with outer bandage intact for the initial soak; this will allow the bandage to become moist and wet for easy lift off.  Once you remove your bandage, continue to soak in the solution for 20 minutes.  This soak should be done twice a day.  Next, remove your foot or feet from solution, blot dry the affected area and cover.  You may use a band aid large enough to cover the area or use gauze and tape.  Apply other medications to the area as directed by the doctor such as polysporin neosporin.  IF YOUR SKIN BECOMES IRRITATED WHILE USING THESE INSTRUCTIONS, IT IS OKAY TO SWITCH TO  WHITE VINEGAR AND WATER. Or you may use antibacterial soap and water to keep the toe clean  Monitor for any signs/symptoms of infection. Call the office immediately if any occur or go directly to the emergency room. Call with any questions/concerns.    VISIT SUMMARY:  You are a 73 year old male with peripheral artery disease who came in today for a callus on your right foot and a painful split nail on your left great toe. The callus on your right foot was causing discomfort, and the split nail on your left great toe had color changes, crumbling, and texture alterations. You wanted the nail removed to allow for regrowth without the split.  YOUR PLAN:  -ONYCHOMYCOSIS: Onychomycosis is a fungal infection of the nail. You have a split nail on your left great toe with color changes and crumbling texture. We discussed treatment with oral fluconazole , which you will take once weekly for six months. We will reevaluate your condition in six months. Please be aware of potential side effects like gastrointestinal issues.    -BENIGN SKIN LESION ON RIGHT FOOT: You have a benign skin lesion on the plantar right medial midfoot, which was causing pain. The lesion was  debrided, and the central core was removed. Excision was not recommended due to the risk of recurrence and painful scarring. If it returns, you should apply salicylic acid treatment, leave it on for 24 hours, then remove and wash with soap and water. You can use the salicylic acid treatment at home as needed.  INSTRUCTIONS:  Please follow up in six months to reevaluate the onychomycosis. Monitor the left great toe for signs of infection or nonhealing and keep it covered with a sterile bandage. Apply salicylic acid treatment to the right foot lesion as needed.

## 2024-01-22 NOTE — Telephone Encounter (Signed)
 Did anyone call the pharmacy on 4/22? The patient is calling about his prescription.

## 2024-01-26 ENCOUNTER — Other Ambulatory Visit: Payer: Self-pay

## 2024-01-26 MED ORDER — FLUCONAZOLE 150 MG PO TABS: 150.0000 mg | ORAL_TABLET | ORAL | 0 refills | Status: AC

## 2024-01-27 ENCOUNTER — Other Ambulatory Visit: Payer: Self-pay

## 2024-01-27 NOTE — Patient Instructions (Signed)
 Visit Information  Thank you for taking time to visit with me today. Please don't hesitate to contact me if I can be of assistance to you before our next scheduled telephone appointment.  Our next appointment is by telephone on 02/26/24 at 10:00  Following is a copy of your care plan:   Goals Addressed             This Visit's Progress    [facility] RN Care Plan   On track    Problems:  Chronic Disease Management support and education needs related to CAD, HLD, and HTN  Goal: Over the next 30 days the Patient will attend all scheduled medical appointments: 03/29/24 Pulmonology, 04/01/24 PCP as evidenced by chart review and patient report        demonstrate Ongoing adherence to prescribed treatment plan for CAD, HLD, and HTN as evidenced by taking all medications as prescribed, attending all medical appointments, completing all ordered lab tests.  Interventions:   CAD Interventions: Assessed understanding of CAD diagnosis Medications reviewed including medications utilized in CAD treatment plan Provided education on importance of blood pressure control in management of CAD Provided education on Importance of limiting foods high in cholesterol Counseled on importance of regular laboratory monitoring as prescribed Reviewed Importance of taking all medications as prescribed Reviewed Importance of attending all scheduled provider appointments Advised to report any changes in symptoms or exercise tolerance   Hyperlipidemia Interventions: Medication review performed; medication list updated in electronic medical record.  Provider established cholesterol goals reviewed Counseled on importance of regular laboratory monitoring as prescribed Reviewed role and benefits of statin for ASCVD risk reduction Reviewed importance of limiting foods high in cholesterol Screening for signs and symptoms of depression related to chronic disease state  Hypertension Interventions: Last practice recorded BP  readings:  BP Readings from Last 3 Encounters:  01/25/24 113/80  12/29/23 112/80  09/09/23 114/76   Most recent eGFR/CrCl:  Lab Results  Component Value Date   EGFR 66 09/09/2023    No components found for: "CRCL"  Evaluation of current treatment plan related to hypertension self management and patient's adherence to plan as established by provider Provided education to patient re: stroke prevention, s/s of heart attack and stroke Reviewed medications with patient and discussed importance of compliance Discussed plans with patient for ongoing care management follow up and provided patient with direct contact information for care management team Advised patient, providing education and rationale, to monitor blood pressure daily and record, calling PCP for findings outside established parameters Discussed complications of poorly controlled blood pressure such as heart disease, stroke, circulatory complications, vision complications, kidney impairment, sexual dysfunction Screening for signs and symptoms of depression related to chronic disease state   Patient Self-Care Activities:  Attend all scheduled provider appointments Call pharmacy for medication refills 3-7 days in advance of running out of medications Call provider office for new concerns or questions  Perform all self care activities independently  Take medications as prescribed   check blood pressure 3 times per week write blood pressure results in a log or diary keep a blood pressure log take blood pressure log to all doctor appointments call doctor for signs and symptoms of high blood pressure keep all doctor appointments take medications for blood pressure exactly as prescribed  Plan:  Telephone follow up appointment with care management team member scheduled for:  02/26/24 at 10:00             Patient verbalizes understanding of instructions and care  plan provided today and agrees to view in MyChart. Active  MyChart status and patient understanding of how to access instructions and care plan via MyChart confirmed with patient.     Telephone follow up appointment with care management team member scheduled for: 02/26/24 at 10:00  Please call the care guide team at [phone] if you need to cancel or reschedule your appointment.   Please call the Suicide and Crisis Lifeline: 988 call the [place]  National Suicide Prevention Lifeline: 581-392-9113 or TTY: 951-448-9725 TTY 804-648-5128) to talk to a trained counselor call 1-800-273-TALK (toll free, 24 hour hotline) if you are experiencing a Mental Health or Behavioral Health Crisis or need someone to talk to.   Clarnce Crow BSN RN CCM [place]  [facility], [facility] Health RN Care Manager Direct Dial: [phone] Fax: [phone]

## 2024-01-27 NOTE — Patient Outreach (Signed)
 Complex Care Management   Visit Note  01/27/2024  Name:  Patrick Tucker MRN: [id number] DOB: April 07, 1951  Situation: Referral received for Complex Care Management related to  HLD, HTN,   I obtained verbal consent from Patient.  Visit completed with patient  on the phone  Background:   Past Medical History:  Diagnosis Date   Hyperlipidemia    Hypertension 2010   Myocardial infarction (HCC) 2015   Prediabetes 2015   ST elevation myocardial infarction (STEMI) (HCC)     Assessment: Patient Reported Symptoms:  Cognitive Cognitive Status: Alert and oriented to person, place, and time      Neurological Neurological Review of Symptoms: No symptoms reported    HEENT HEENT Symptoms Reported: No symptoms reported      Cardiovascular Cardiovascular Symptoms Reported: No symptoms reported Does patient have uncontrolled Hypertension?:  (last BP 4/27 113/80) Cardiovascular Conditions: Hypertension, High blood cholesterol, Coronary artery disease Cardiovascular Management Strategies: Medication therapy, Diet modification Weight: 165 lb (74.8 kg) Cardiovascular Self-Management Outcome: 5 (very good)  Respiratory Respiratory Symptoms Reported: Shortness of breath (still reporting SOB, missed appt with PULM, r/s for 04/01/24) Additional Respiratory Details: patient continues to have SOB with exertion relieved when he rests, takes about two minutes to go away. Respiratory Conditions: Shortness of breath, Sleep disordered breathing  Endocrine Patient reports the following symptoms related to hypoglycemia or hyperglycemia : No symptoms reported Is patient diabetic?: No    Gastrointestinal Gastrointestinal Symptoms Reported: Abdominal pain or discomfort (patient continues to report bloating, no reason found) Gastrointestinal Management Strategies: Diet modification, Exercise Gastrointestinal Self-Management Outcome: 3 (uncertain) Gastrointestinal Comment: patient has found nothing to day that  provides relief Nutrition Risk Screen (CP): No indicators present  Genitourinary Genitourinary Symptoms Reported: No symptoms reported    Integumentary Integumentary Symptoms Reported: No symptoms reported    Musculoskeletal Musculoskelatal Symptoms Reviewed: Other Other Musculoskeletal Symptoms: patient reports symptoms continue, he continues to exercise as directed by provider Musculoskeletal Management Strategies: Adequate rest, Activity, Exercise, Routine screening Musculoskeletal Self-Management Outcome: 3 (uncertain) Falls in the past year?: No    Psychosocial              12/29/2023    1:14 PM  Depression screen PHQ 2/9  Decreased Interest 0  Down, Depressed, Hopeless 0  PHQ - 2 Score 0  Altered sleeping 0  Tired, decreased energy 0  Change in appetite 0  Feeling bad or failure about yourself  0  Trouble concentrating 0  Moving slowly or fidgety/restless 0  Suicidal thoughts 0  PHQ-9 Score 0  Difficult doing work/chores Not difficult at all    Vitals:   01/25/24 0939  BP: 113/80    Medications Reviewed Today     Reviewed by Clarnce Crow, RN (Registered Nurse) on 01/27/24 at [phone]  Med List Status: <None>   Medication Order Taking? Sig Documenting Provider Last Dose Status Informant  ascorbic acid (VITAMIN C ) 1000 MG tablet [id number] Yes Take 1,000 mg by mouth daily. [provider] Taking Active Self  atorvastatin  (LIPITOR ) 80 MG tablet [id number] Yes Take 1 tablet by mouth daily. [provider] Taking Active Self  carvedilol  (COREG ) 12.5 MG tablet [id number] Yes Take 12.5 mg by mouth 2 (two) times daily with a meal. [provider] Taking Active Self  Cholecalciferol (VITAMIN D -3) 125 MCG (5000 UT) TABS [id number] Yes Take 1 tablet by mouth daily. [provider] Taking Active Self  clopidogrel  (PLAVIX ) 75 MG tablet [id number] Yes Take   1 tablet  Daily     to Prevent Blood Clots Vangie Genet, MD Taking Active Self   ezetimibe  (ZETIA ) 10 MG tablet [id number] Yes Take 1 tablet by mouth daily. [provider] Taking Active Self  fluconazole  (DIFLUCAN ) 150 MG tablet [id number] Yes Take 1 tablet (150 mg total) by mouth once a week for 26 doses. Floyce Hutching, DPM Taking Active   lisinopril -hydrochlorothiazide  (ZESTORETIC ) 20-12.5 MG tablet [id number] Yes Take 1 tablet by mouth daily. Catheryn Cluck, MD Taking Active   Multiple Vitamin (MULTIVITAMIN) tablet [id number] Yes Take 1 tablet by mouth daily. [provider] Taking Active Self  pantoprazole  (PROTONIX ) 40 MG tablet [id number] Yes Take  1 tablet  Daily  to Prevent Indigestion & Heartburn Vangie Genet, MD Taking Active Self  vitamin E 45 MG (100 UNITS) capsule [id number] Yes Take 100 Units by mouth daily. [provider] Taking Active Self            Recommendation:   PCP Follow-up  Follow Up Plan:   Telephone follow up appointment date/time:  02/26/24 at 10:00   Clarnce Crow BSN RN CCM [place]  [facility], [facility] Health RN Care Manager Direct Dial: [phone] Fax: [phone]

## 2024-02-26 ENCOUNTER — Telehealth: Payer: Self-pay

## 2024-02-27 DIAGNOSIS — R0602 Shortness of breath: Secondary | ICD-10-CM | POA: Diagnosis not present

## 2024-03-05 ENCOUNTER — Ambulatory Visit: Payer: Medicare HMO | Admitting: Internal Medicine

## 2024-03-05 ENCOUNTER — Telehealth: Payer: Self-pay

## 2024-03-05 NOTE — Progress Notes (Signed)
 Complex Care Management Note Care Guide Note  03/05/2024 Name: Patrick Tucker MRN: [id number] DOB: 08-11-1951   Complex Care Management Outreach Attempts: An unsuccessful telephone outreach was attempted today to offer the patient information about available complex care management services.  Follow Up Plan:  Additional outreach attempts will be made to offer the patient complex care management information and services.   Encounter Outcome:  No Answer  [facility]  [facility], [facility] Care Management Assistant Direct Dial: [phone]  Fax: [phone]

## 2024-03-10 NOTE — Telephone Encounter (Signed)
 CMN for DME request  [facility]

## 2024-03-11 ENCOUNTER — Telehealth: Payer: Self-pay | Admitting: *Deleted

## 2024-03-11 NOTE — Telephone Encounter (Signed)
 Copied from CRM [id number]. Topic: Clinical - Order For Equipment >> Mar 10, 2024 12:10 PM Juliana Ocean wrote: Reason for CRM: Patrick Tucker w/ [employer] states they are faxing request for order for pt's CPAP supplies and OV notes. Patrick Tucker had the wrong fax number, provided w/ the correct number. Any questions, he can be reached at  C/b  [phone]   please Fax [phone]  NOTED

## 2024-03-12 NOTE — Progress Notes (Signed)
 Complex Care Management Note Care Guide Note  03/12/2024 Name: Patrick Tucker MRN: [id number] DOB: Oct 04, 1950   Complex Care Management Outreach Attempts: A second unsuccessful outreach was attempted today to offer the patient with information about available complex care management services.  Follow Up Plan:  No further outreach attempts will be made at this time. We have been unable to contact the patient to offer or enroll patient in complex care management services.  Encounter Outcome:  No Answer  [facility]  [facility], [facility] Care Management Assistant Direct Dial: [phone]  Fax: [phone]

## 2024-03-12 NOTE — Progress Notes (Signed)
 Complex Care Management Care Guide Note  03/12/2024 Name: Patrick Tucker MRN: [id number] DOB: 04-18-1951  Patrick Tucker is a 73 y.o. year old male who is a primary care patient of Catheryn Cluck, MD and is actively engaged with the care management team. I reached out to Patrick Tucker by phone today to assist with re-scheduling  with the RN Case Manager.  Follow up plan: Telephone appointment with complex care management team member scheduled for:  03/26/24 at 1:30 p.m.   [facility]  [facility], [facility] Health Care Management Assistant Direct Dial: [phone]  Fax: [phone]

## 2024-03-16 ENCOUNTER — Telehealth (HOSPITAL_BASED_OUTPATIENT_CLINIC_OR_DEPARTMENT_OTHER): Payer: Self-pay

## 2024-03-16 NOTE — Telephone Encounter (Signed)
 Copied from CRM [id number]. Topic: Clinical - Order For Equipment >> Mar 16, 2024  1:50 PM Eveleen Hinds B wrote: Reason for CRM:  Lindell Revel w/ [employer] calling because they have not received order for pt's CPAP supplies and OV notes.    C/b  [phone]   please Fax [phone]

## 2024-03-18 NOTE — Telephone Encounter (Signed)
 Ordered through [employer], sent back to [facility] to contact [employer]

## 2024-03-26 ENCOUNTER — Other Ambulatory Visit: Payer: Self-pay

## 2024-03-26 NOTE — Patient Instructions (Signed)
 Visit Information  Thank you for taking time to visit with me today. Please don't hesitate to contact me if I can be of assistance to you before our next scheduled appointment.  Our next appointment is by telephone on 04/16/24 at 2:00 Please call the care guide team at [phone] if you need to cancel or reschedule your appointment.   Following is a copy of your care plan:   Goals Addressed             This Visit's Progress    [facility]BCI RN Care Plan   On track    Problems:  Chronic Disease Management support and education needs related to CAD, HLD, and HTN  Goal: Over the next 30 days the Patient will attend all scheduled medical appointments: 04/16/24 PCP, 06/04/24 AWV, 9/23 Pulm and Podiatry as evidenced by chart review and patient report        demonstrate Ongoing adherence to prescribed treatment plan for CAD, HLD, and HTN as evidenced by taking all medications as prescribed, attending all medical appointments, completing all ordered lab tests.  Interventions:   CAD Interventions: Assessed understanding of CAD diagnosis Medications reviewed including medications utilized in CAD treatment plan Provided education on importance of blood pressure control in management of CAD Provided education on Importance of limiting foods high in cholesterol Counseled on importance of regular laboratory monitoring as prescribed Reviewed Importance of taking all medications as prescribed Reviewed Importance of attending all scheduled provider appointments Advised to report any changes in symptoms or exercise tolerance   Hyperlipidemia Interventions: Medication review performed; medication list updated in electronic medical record.  Provider established cholesterol goals reviewed Counseled on importance of regular laboratory monitoring as prescribed Reviewed role and benefits of statin for ASCVD risk reduction Reviewed importance of limiting foods high in cholesterol Screening for signs and  symptoms of depression related to chronic disease state  Hypertension Interventions: Last practice recorded BP readings:  BP Readings from Last 3 Encounters:  01/25/24 113/80  12/29/23 112/80  09/09/23 114/76   Most recent eGFR/CrCl:  Lab Results  Component Value Date   EGFR 66 09/09/2023    No components found for: CRCL  Evaluation of current treatment plan related to hypertension self management and patient's adherence to plan as established by provider Provided education to patient re: stroke prevention, s/s of heart attack and stroke Reviewed medications with patient and discussed importance of compliance Discussed plans with patient for ongoing care management follow up and provided patient with direct contact information for care management team Advised patient, providing education and rationale, to monitor blood pressure daily and record, calling PCP for findings outside established parameters Discussed complications of poorly controlled blood pressure such as heart disease, stroke, circulatory complications, vision complications, kidney impairment, sexual dysfunction Screening for signs and symptoms of depression related to chronic disease state   Patient Self-Care Activities:  Attend all scheduled provider appointments Call pharmacy for medication refills 3-7 days in advance of running out of medications Call provider office for new concerns or questions  Perform all self care activities independently  Take medications as prescribed   check blood pressure 3 times per week write blood pressure results in a log or diary keep a blood pressure log take blood pressure log to all doctor appointments call doctor for signs and symptoms of high blood pressure keep all doctor appointments take medications for blood pressure exactly as prescribed  Plan:  Telephone follow up appointment with care management team member scheduled for:  04/16/24  at 2:00             Please  call the Suicide and Crisis Lifeline: 988 call the [place]  National Suicide Prevention Lifeline: 270-761-4711 or TTY: (671)539-1172 TTY (934)223-0258) to talk to a trained counselor call 1-800-273-TALK (toll free, 24 hour hotline) if you are experiencing a Mental Health or Behavioral Health Crisis or need someone to talk to.  Patient verbalizes understanding of instructions and care plan provided today and agrees to view in MyChart. Active MyChart status and patient understanding of how to access instructions and care plan via MyChart confirmed with patient.      Olam Idol BSN RN CCM [employer]  [facility], [facility] Health RN Care Manager Direct Dial: [phone] Fax: [phone]

## 2024-03-26 NOTE — Patient Outreach (Signed)
 Complex Care Management   Visit Note  03/26/2024  Name:  Patrick Tucker MRN: [id number] DOB: 1951-05-27  Situation: Referral received for Complex Care Management related to CAD, PAD, HTN I obtained verbal consent from Patient.  Visit completed with patient  on the phone  Background:   Past Medical History:  Diagnosis Date   Hyperlipidemia    Hypertension 2010   Myocardial infarction [facility]) 2015   Prediabetes 2015   ST elevation myocardial infarction (STEMI) (HCC)     Assessment: Patient Reported Symptoms:  Cognitive Cognitive Status: Alert and oriented to person, place, and time      Neurological Neurological Review of Symptoms: No symptoms reported    HEENT HEENT Symptoms Reported: No symptoms reported      Cardiovascular Cardiovascular Symptoms Reported: No symptoms reported (patient reports BLE resolved) Does patient have uncontrolled Hypertension?: No Cardiovascular Conditions: Hypertension, Coronary artery disease (PAD patient reports pain if too much walking)  Respiratory Respiratory Symptoms Reported: Shortness of breath Additional Respiratory Details: patient reports SOB has not been noticable, recently started new job which he says is very active Respiratory Conditions: Shortness of breath, Sleep disordered breathing Respiratory Comment: patient waiting for new CPAP supplies vendor [employer]  Endocrine Patient reports the following symptoms related to hypoglycemia or hyperglycemia : No symptoms reported Is patient diabetic?: No    Gastrointestinal Gastrointestinal Symptoms Reported: No symptoms reported      Genitourinary Genitourinary Symptoms Reported: No symptoms reported    Integumentary Integumentary Symptoms Reported: No symptoms reported    Musculoskeletal Musculoskelatal Symptoms Reviewed: Other Other Musculoskeletal Symptoms: PAD symptoms occur when patient is walking for extended period of time. s/p SFA stent        Psychosocial Psychosocial Symptoms  Reported: No symptoms reported     Quality of Family Relationships: helpful, supportive Do you feel physically threatened by others?: No      12/29/2023    1:14 PM  Depression screen PHQ 2/9  Decreased Interest 0  Down, Depressed, Hopeless 0  PHQ - 2 Score 0  Altered sleeping 0  Tired, decreased energy 0  Change in appetite 0  Feeling bad or failure about yourself  0  Trouble concentrating 0  Moving slowly or fidgety/restless 0  Suicidal thoughts 0  PHQ-9 Score 0  Difficult doing work/chores Not difficult at all    There were no vitals filed for this visit.  Medications Reviewed Today     Reviewed by Lonzell Planas, RN (Registered Nurse) on 03/26/24 at 1346  Med List Status: <None>   Medication Order Taking? Sig Documenting Provider Last Dose Status Informant  ascorbic acid (VITAMIN C ) 1000 MG tablet [id number] Yes Take 1,000 mg by mouth daily. [provider]  Active Self  atorvastatin  (LIPITOR ) 80 MG tablet [id number] Yes Take 1 tablet by mouth daily. [provider]  Active Self  carvedilol  (COREG ) 12.5 MG tablet [id number] Yes Take 12.5 mg by mouth 2 (two) times daily with a meal. [provider]  Active Self  Cholecalciferol (VITAMIN D -3) 125 MCG (5000 UT) TABS [id number] Yes Take 1 tablet by mouth daily. [provider]  Active Self  clopidogrel  (PLAVIX ) 75 MG tablet [id number] Yes Take   1 tablet   Daily     to Prevent Blood Clots Tonita Fallow, MD  Active Self  ezetimibe  (ZETIA ) 10 MG tablet [id number] Yes Take 1 tablet by mouth daily. [provider]  Active Self  fluconazole  (DIFLUCAN ) 150 MG tablet [id number] Yes Take 1 tablet (150 mg total)  by mouth once a week for 26 doses. Silva Juliene SAUNDERS, DPM  Active   lisinopril -hydrochlorothiazide  (ZESTORETIC ) 20-12.5 MG tablet [id number] Yes Take 1 tablet by mouth daily. Sebastian Beverley NOVAK, MD  Active   Multiple Vitamin (MULTIVITAMIN) tablet [id number] Yes Take 1 tablet by mouth daily.  [provider]  Active Self  pantoprazole  (PROTONIX ) 40 MG tablet [id number] Yes Take  1 tablet  Daily  to Prevent Indigestion & Heartburn Tonita Fallow, MD  Active Self  vitamin E 45 MG (100 UNITS) capsule [id number] Yes Take 100 Units by mouth daily. [provider]  Active Self            Recommendation:   Continue Current Plan of Care  Follow Up Plan:   Telephone follow up appointment date/time:  04/16/24 at 2:00   Olam Idol BSN RN CCM   [facility], [facility] Health RN Care Manager Direct Dial: [phone] Fax: [phone]

## 2024-03-29 ENCOUNTER — Ambulatory Visit: Admitting: Family Medicine

## 2024-04-01 ENCOUNTER — Ambulatory Visit: Admitting: Internal Medicine

## 2024-04-16 ENCOUNTER — Ambulatory Visit: Admitting: Family Medicine

## 2024-04-16 ENCOUNTER — Telehealth: Payer: Self-pay

## 2024-04-16 VITALS — BP 100/60 | HR 61 | Temp 97.9°F | Ht 69.0 in | Wt 169.0 lb

## 2024-04-16 DIAGNOSIS — I10 Essential (primary) hypertension: Secondary | ICD-10-CM | POA: Diagnosis not present

## 2024-04-16 DIAGNOSIS — E559 Vitamin D deficiency, unspecified: Secondary | ICD-10-CM

## 2024-04-16 DIAGNOSIS — D631 Anemia in chronic kidney disease: Secondary | ICD-10-CM

## 2024-04-16 DIAGNOSIS — R140 Abdominal distension (gaseous): Secondary | ICD-10-CM

## 2024-04-16 DIAGNOSIS — N401 Enlarged prostate with lower urinary tract symptoms: Secondary | ICD-10-CM

## 2024-04-16 DIAGNOSIS — I2510 Atherosclerotic heart disease of native coronary artery without angina pectoris: Secondary | ICD-10-CM | POA: Diagnosis not present

## 2024-04-16 DIAGNOSIS — I5042 Chronic combined systolic (congestive) and diastolic (congestive) heart failure: Secondary | ICD-10-CM | POA: Diagnosis not present

## 2024-04-16 DIAGNOSIS — N1831 Chronic kidney disease, stage 3a: Secondary | ICD-10-CM | POA: Diagnosis not present

## 2024-04-16 DIAGNOSIS — K638219 Small intestinal bacterial overgrowth, unspecified: Secondary | ICD-10-CM

## 2024-04-16 DIAGNOSIS — R351 Nocturia: Secondary | ICD-10-CM

## 2024-04-16 DIAGNOSIS — E782 Mixed hyperlipidemia: Secondary | ICD-10-CM

## 2024-04-16 DIAGNOSIS — R0609 Other forms of dyspnea: Secondary | ICD-10-CM | POA: Diagnosis not present

## 2024-04-16 DIAGNOSIS — I739 Peripheral vascular disease, unspecified: Secondary | ICD-10-CM | POA: Diagnosis not present

## 2024-04-16 MED ORDER — RIFAXIMIN 200 MG PO TABS: 200.0000 mg | ORAL_TABLET | Freq: Three times a day (TID) | ORAL | 0 refills | Status: AC

## 2024-04-16 NOTE — Patient Instructions (Signed)
  VISIT SUMMARY: Today, we discussed your ongoing issues with shortness of breath and abdominal bloating. We reviewed your history of coronary artery disease, hypertension, and other health conditions. We have made some adjustments to your treatment plan and scheduled follow-up appointments to monitor your progress.  YOUR PLAN: -BLOATING: Chronic bloating can be caused by various factors, including bacterial overgrowth in the small intestine. We will start you on rifaximin 200 mg three times daily for one month after your trip to [place] . If your symptoms persist, we may refer you to a new gastroenterologist and consider a hydrogen breath test to check for bacterial overgrowth.  -SHORTNESS OF BREATH: Your shortness of breath occurs mainly when lifting heavy objects and is being monitored by your pulmonologist. We will continue to follow up on this issue.  -CORONARY ARTERY DISEASE AND HEART FAILURE WITH PRESERVED EJECTION FRACTION: Your coronary artery disease and heart failure with preserved ejection fraction are currently well-managed. We will order routine fasting lab work to monitor your cholesterol, A1c, kidney function, electrolytes, thyroid , and magnesium levels after your trip to [place] .  INSTRUCTIONS: Please schedule a follow-up appointment in six weeks to evaluate the effectiveness of the rifaximin treatment and to discuss your lab results. Make sure to complete the fasting lab work after your trip to [place] .

## 2024-04-16 NOTE — Progress Notes (Signed)
 Assessment & Plan   Assessment/Plan:    Assessment & Plan Bloating Chronic bloating with no definitive diagnosis despite multiple gastroenterology consultations. Differential includes small intestinal bacterial overgrowth (SIBO). A hydrogen breath test for SIBO is considered, but he prefers to try treatment first. Rifaximin is chosen for its efficacy in reducing bacterial overgrowth and gas production. - Prescribe rifaximin 200 mg three times daily for one month, to be initiated post-[place]  trip. - Consider referral to a new gastroenterologist if symptoms persist. - Discuss hydrogen breath test for SIBO if symptoms do not improve.  Shortness of breath Intermittent dyspnea when lifting heavy objects, such as a case of water. No associated chest pain. He has coronary artery disease and heart failure with preserved ejection fraction. Pulmonology follow-up is ongoing.  Coronary artery disease and heart failure with preserved ejection fraction Coronary artery disease and heart failure with preserved ejection fraction. Recent cardiology evaluation indicates well-managed condition with normal cardiac function. Due for routine fasting lab work to monitor cholesterol, A1c, kidney function, electrolytes, thyroid , and magnesium levels. - Order fasting lab work to monitor cholesterol, A1c, kidney function, electrolytes, thyroid , and magnesium levels post-[place]  trip.  Follow-up Follow-up needed to assess response to rifaximin treatment and review lab results. - Schedule follow-up appointment in six weeks to evaluate rifaximin effectiveness and discuss lab results.      There are no discontinued medications.  Return in about 6 weeks (around 05/28/2024) for bloating.        Subjective:   Encounter date: 04/16/2024  Patrick Tucker is a 73 y.o. male who has Hypertension; Hyperlipidemia; Vitamin D  deficiency; Former smoker; CAD (coronary artery disease); PAD (peripheral artery disease)  (HCC); Chronic heart failure (HCC); OSA (obstructive sleep apnea); Elevated CK; Myalgia; BMI 23.0-23.9, adult; Anemia; Dyspnea on exertion; Financial insecurity due to medical expenses; Abdominal bloating; Stage 2 chronic kidney disease; Stage 3a chronic kidney disease (HCC); BPH associated with nocturia; and Small intestinal bacterial overgrowth (SIBO) on their problem list..   He  has a past medical history of Hyperlipidemia, Hypertension (2010), Myocardial infarction (HCC) (2015), Prediabetes (2015), and ST elevation myocardial infarction (STEMI) (HCC).SABRA   He presents with chief complaint of Follow-up (14mo; have a couple of concerns; not fasting this morning (had a cantelope, breakfast bar and egg)) .   Discussed the use of AI scribe software for clinical note transcription with the patient, who gave verbal consent to proceed.  History of Present Illness Patrick Tucker is a 73 year old male with coronary artery disease and hypertension who presents with shortness of breath and abdominal bloating.  He experiences shortness of breath primarily when lifting heavy objects, such as a case of water. This symptom is noticeable but not severe. He has been following up with pulmonology for this issue.  He reports persistent abdominal bloating, which has not been resolved despite consultations with multiple doctors. His appetite has decreased with age. He has undergone some testing with gastroenterology but has not received a definitive diagnosis. He recalls visiting a gastroenterologist at [facility] earlier this year.  He has a history of coronary artery disease, hypertension, peripheral artery disease, and sleep apnea. He reports that his cardiologist found everything to be in order at his last visit, and that his A1c was 'pretty normal.' He is currently taking medications, which he takes with water and a breakfast bar in the morning.  No chest pain or any other significant issues aside from  the shortness of breath and bloating. He occasionally wakes  up at night to urinate but does not report any significant changes in urinary habits.     ROS  Past Surgical History:  Procedure Laterality Date   CORONARY STENT INTERVENTION N/A 05/05/2020   Procedure: CORONARY STENT INTERVENTION;  Surgeon: Burnard Debby LABOR, MD;  Location: [facility] IN[facility];  Service: Cardiovascular;  Laterality: N/A;   CORONARY/GRAFT ACUTE MI REVASCULARIZATION N/A 05/05/2020   Procedure: Coronary/Graft Acute MI Revascularization;  Surgeon: Burnard Debby LABOR, MD;  Location: [facility] IN[facility] CV [facility];  Service: Cardiovascular;  Laterality: N/A;   LEFT HEART CATH AND CORONARY ANGIOGRAPHY N/A 05/05/2020   Procedure: LEFT HEART CATH AND CORONARY ANGIOGRAPHY;  Surgeon: Burnard Debby LABOR, MD;  Location: [facility] CV [facility];  Service: Cardiovascular;  Laterality: N/A;    Outpatient Medications Prior to Visit  Medication Sig Dispense Refill   ascorbic acid (VITAMIN C ) 1000 MG tablet Take 1,000 mg by mouth daily.     atorvastatin  (LIPITOR ) 80 MG tablet Take 1 tablet by mouth daily.     carvedilol  (COREG ) 12.5 MG tablet Take 12.5 mg by mouth 2 (two) times daily with a meal.     Cholecalciferol (VITAMIN D -3) 125 MCG (5000 UT) TABS Take 1 tablet by mouth daily.     clopidogrel  (PLAVIX ) 75 MG tablet Take   1 tablet   Daily     to Prevent Blood Clots 90 tablet 0   ezetimibe  (ZETIA ) 10 MG tablet Take 1 tablet by mouth daily.     fluconazole  (DIFLUCAN ) 150 MG tablet Take 1 tablet (150 mg total) by mouth once a week for 26 doses. 26 tablet 0   lisinopril -hydrochlorothiazide  (ZESTORETIC ) 20-12.5 MG tablet Take 1 tablet by mouth daily. 90 tablet 3   Multiple Vitamin (MULTIVITAMIN) tablet Take 1 tablet by mouth daily.     pantoprazole  (PROTONIX ) 40 MG tablet Take  1 tablet  Daily  to Prevent Indigestion & Heartburn 90 tablet 3   vitamin E 45 MG (100 UNITS) capsule Take 100 Units by mouth daily.     No facility-administered medications prior to  visit.    Family History  Problem Relation Age of Onset   Cancer Mother    Hypertension Father    Heart disease Father    Diabetes Sister    Stroke Brother    COPD Brother    Hypertension Brother    COPD Brother     Social History   Socioeconomic History   Marital status: Married    Spouse name: sarah   Number of children: 1   Years of education: Not on file   Highest education level: Not on file  Occupational History   Not on file  Tobacco Use   Smoking status: Former    Current packs/day: 0.00    Types: Cigarettes    Quit date: 2000    Years since quitting: 25.5   Smokeless tobacco: Never  Vaping Use   Vaping status: Never Used  Substance and Sexual Activity   Alcohol use: Not Currently   Drug use: Not on file   Sexual activity: Not on file  Other Topics Concern   Not on file  Social History Narrative   Not on file   Social Drivers of Health   Financial Resource Strain: Not on file  Food Insecurity: No Food Insecurity (01/27/2024)   Hunger Vital Sign    Worried About Running Out of Food in the Last Year: Never true    Ran Out of Food in the Last Year: Never  true  Transportation Needs: No Transportation Needs (01/27/2024)   PRAPARE - Administrator, Civil Service (Medical): No    Lack of Transportation (Non-Medical): No  Physical Activity: Not on file  Stress: Not on file  Social Connections: Not on file  Intimate Partner Violence: Not At Risk (01/27/2024)   Humiliation, Afraid, Rape, and Kick questionnaire    Fear of Current or Ex-Partner: No    Emotionally Abused: No    Physically Abused: No    Sexually Abused: No                                                                                                  Objective:  Physical Exam: BP 100/60   Pulse 61   Temp 97.9 F (36.6 C)   Ht 5' 9 (1.753 m)   Wt 169 lb (76.7 kg)   SpO2 98%   BMI 24.96 kg/m    Physical Exam GENERAL: Alert, cooperative, well developed, no acute  distress. HEENT: Normocephalic, normal oropharynx, moist mucous membranes. CHEST: Clear to auscultation bilaterally, no wheezes, rhonchi, or crackles. CARDIOVASCULAR: Normal heart rate and rhythm, S1 and S2 normal without murmurs. ABDOMEN: Soft, non-tender,distended without organomegaly, normal bowel sounds. EXTREMITIES: No cyanosis or edema. NEUROLOGICAL: Cranial nerves grossly intact, moves all extremities without gross motor or sensory deficit.   Physical Exam  No results found.  No results found for this or any previous visit (from the past 2160 hours).      Beverley Adine Hummer, MD, MS

## 2024-05-07 ENCOUNTER — Ambulatory Visit: Admitting: Internal Medicine

## 2024-05-26 ENCOUNTER — Telehealth: Payer: Self-pay

## 2024-05-26 NOTE — Patient Instructions (Signed)
 Visit Information  Thank you for taking time to visit with me today. Please don't hesitate to contact me if I can be of assistance to you before our next scheduled appointment.   Patient has met all care management goals. Care Management case will be closed. Patient has been provided contact information should new needs arise.   Please call the care guide team at [phone] if you need to cancel, schedule, or reschedule an appointment.   Please call the Suicide and Crisis Lifeline: 988 call the [place]  National Suicide Prevention Lifeline: 208-654-3962 or TTY: 9373817290 TTY 517-634-6737) to talk to a trained counselor call 1-800-273-TALK (toll free, 24 hour hotline) if you are experiencing a Mental Health or Behavioral Health Crisis or need someone to talk to.  Heddy Shutter, RN, MSN, BSN, CCM [place]  [facility], Population Health Case Manager Phone: [phone]

## 2024-05-26 NOTE — Patient Outreach (Signed)
 Complex Care Management   Visit Note  05/26/2024  Name:  Patrick Tucker MRN: [id number] DOB: 10-Aug-1951  Situation: transfer from FREDRIK Idol, RN. RNCM called to discuss if any additional resource needs or care management needs and to review goals set by previous RNCM. Patient denies any needs at this time. He reports conditions are managed at this time. Per patient agrees to case closure and reports he has an upcoming appointment with PCP on 05/28/24 and states if needs arise later, then he will contact PCP and request a referral for CM services.   Background:   Past Medical History:  Diagnosis Date   Hyperlipidemia    Hypertension 2010   Myocardial infarction [facility]) 2015   Prediabetes 2015   ST elevation myocardial infarction (STEMI) (HCC)    Assessment: Patient Reported Symptoms:  Cognitive Cognitive Status: No symptoms reported, Normal speech and language skills, Insightful and able to interpret abstract concepts, Alert and oriented to person, place, and time      Neurological Neurological Review of Symptoms: No symptoms reported    HEENT HEENT Symptoms Reported: No symptoms reported      Cardiovascular Cardiovascular Symptoms Reported: No symptoms reported    Respiratory Respiratory Symptoms Reported: No symptoms reported    Endocrine Endocrine Symptoms Reported: No symptoms reported    Gastrointestinal Gastrointestinal Symptoms Reported: No symptoms reported Additional Gastrointestinal Details: per review of chart, constant bloating-managed by PCP. patient with upcoming appointment on 05/28/24.      Genitourinary Genitourinary Symptoms Reported: No symptoms reported    Integumentary Integumentary Symptoms Reported: No symptoms reported    Musculoskeletal Musculoskelatal Symptoms Reviewed: No symptoms reported        Psychosocial Psychosocial Symptoms Reported: No symptoms reported          There were no vitals filed for this visit.  Medications Reviewed Today    Medications were not reviewed in this encounter   Recommendation:   Continue to follow up with PCP as scheduled. Next visit 05/28/24  Follow Up Plan:   Patient has met all care management goals. Care Management case will be closed. Patient has been provided contact information should new needs arise.   Heddy Shutter, RN, MSN, BSN, CCM [place]  [facility], Population Health Case Manager Phone: [phone]

## 2024-05-28 ENCOUNTER — Ambulatory Visit: Admitting: Family Medicine

## 2024-05-28 ENCOUNTER — Encounter: Payer: Self-pay | Admitting: Family Medicine

## 2024-05-28 DIAGNOSIS — E559 Vitamin D deficiency, unspecified: Secondary | ICD-10-CM | POA: Diagnosis not present

## 2024-05-28 DIAGNOSIS — K638219 Small intestinal bacterial overgrowth, unspecified: Secondary | ICD-10-CM

## 2024-05-28 DIAGNOSIS — R140 Abdominal distension (gaseous): Secondary | ICD-10-CM | POA: Diagnosis not present

## 2024-05-28 DIAGNOSIS — I739 Peripheral vascular disease, unspecified: Secondary | ICD-10-CM | POA: Diagnosis not present

## 2024-05-28 DIAGNOSIS — I5042 Chronic combined systolic (congestive) and diastolic (congestive) heart failure: Secondary | ICD-10-CM | POA: Diagnosis not present

## 2024-05-28 DIAGNOSIS — D631 Anemia in chronic kidney disease: Secondary | ICD-10-CM

## 2024-05-28 DIAGNOSIS — I2510 Atherosclerotic heart disease of native coronary artery without angina pectoris: Secondary | ICD-10-CM

## 2024-05-28 DIAGNOSIS — N1831 Chronic kidney disease, stage 3a: Secondary | ICD-10-CM

## 2024-05-28 DIAGNOSIS — I10 Essential (primary) hypertension: Secondary | ICD-10-CM | POA: Diagnosis not present

## 2024-05-28 DIAGNOSIS — R351 Nocturia: Secondary | ICD-10-CM | POA: Diagnosis not present

## 2024-05-28 DIAGNOSIS — N401 Enlarged prostate with lower urinary tract symptoms: Secondary | ICD-10-CM

## 2024-05-28 DIAGNOSIS — E782 Mixed hyperlipidemia: Secondary | ICD-10-CM | POA: Diagnosis not present

## 2024-05-28 LAB — COMPREHENSIVE METABOLIC PANEL WITH GFR
ALT: 25 U/L (ref 0–53)
AST: 25 U/L (ref 0–37)
Albumin: 4.3 g/dL (ref 3.5–5.2)
Alkaline Phosphatase: 102 U/L (ref 39–117)
BUN: 17 mg/dL (ref 6–23)
CO2: 30 meq/L (ref 19–32)
Calcium: 9 mg/dL (ref 8.4–10.5)
Chloride: 103 meq/L (ref 96–112)
Creatinine, Ser: 1.28 mg/dL (ref 0.40–1.50)
GFR: 56.59 mL/min — ABNORMAL LOW (ref >60.00)
Glucose, Bld: 94 mg/dL (ref 70–99)
Potassium: 4.2 meq/L (ref 3.5–5.1)
Sodium: 143 meq/L (ref 135–145)
Total Bilirubin: 0.7 mg/dL (ref 0.2–1.2)
Total Protein: 6.6 g/dL (ref 6.0–8.3)

## 2024-05-28 LAB — URINALYSIS, ROUTINE W REFLEX MICROSCOPIC
Bilirubin Urine: NEGATIVE
Hgb urine dipstick: NEGATIVE
Ketones, ur: NEGATIVE
Leukocytes,Ua: NEGATIVE
Nitrite: NEGATIVE
Specific Gravity, Urine: 1.015 (ref 1.000–1.030)
Total Protein, Urine: NEGATIVE
Urine Glucose: NEGATIVE
Urobilinogen, UA: 0.2 (ref 0.0–1.0)
pH: 6 (ref 5.0–8.0)

## 2024-05-28 LAB — CBC WITH DIFFERENTIAL/PLATELET
Basophils Absolute: 0 K/uL (ref 0.0–0.1)
Basophils Relative: 0.7 % (ref 0.0–3.0)
Eosinophils Absolute: 0.1 K/uL (ref 0.0–0.7)
Eosinophils Relative: 2.9 % (ref 0.0–5.0)
HCT: 41.3 % (ref 39.0–52.0)
Hemoglobin: 13.4 g/dL (ref 13.0–17.0)
Lymphocytes Relative: 36.5 % (ref 12.0–46.0)
Lymphs Abs: 1.4 K/uL (ref 0.7–4.0)
MCHC: 32.5 g/dL (ref 30.0–36.0)
MCV: 90.8 fl (ref 78.0–100.0)
Monocytes Absolute: 0.4 K/uL (ref 0.1–1.0)
Monocytes Relative: 9.8 % (ref 3.0–12.0)
Neutro Abs: 1.9 K/uL (ref 1.4–7.7)
Neutrophils Relative %: 50.1 % (ref 43.0–77.0)
Platelets: 189 K/uL (ref 150.0–400.0)
RBC: 4.55 Mil/uL (ref 4.22–5.81)
RDW: 14.1 % (ref 11.5–15.5)
WBC: 3.8 K/uL — ABNORMAL LOW (ref 4.0–10.5)

## 2024-05-28 LAB — LIPID PANEL
Cholesterol: 97 mg/dL (ref 0–200)
HDL: 36 mg/dL — ABNORMAL LOW (ref >39.00)
LDL Cholesterol: 41 mg/dL (ref 0–99)
NonHDL: 61
Total CHOL/HDL Ratio: 3
Triglycerides: 102 mg/dL (ref 0.0–149.0)
VLDL: 20.4 mg/dL (ref 0.0–40.0)

## 2024-05-28 LAB — PSA: PSA: 1.88 ng/mL (ref 0.10–4.00)

## 2024-05-28 LAB — VITAMIN D 25 HYDROXY (VIT D DEFICIENCY, FRACTURES): VITD: 104.5 ng/mL (ref 30.00–100.00)

## 2024-05-28 LAB — PHOSPHORUS: Phosphorus: 3 mg/dL (ref 2.3–4.6)

## 2024-05-28 LAB — TSH: TSH: 1.14 u[IU]/mL (ref 0.35–5.50)

## 2024-05-28 LAB — HEMOGLOBIN A1C: Hgb A1c MFr Bld: 7.1 % — ABNORMAL HIGH (ref 4.6–6.5)

## 2024-05-28 MED ORDER — RIFAXIMIN 200 MG PO TABS: 400.0000 mg | ORAL_TABLET | Freq: Three times a day (TID) | ORAL | 0 refills | Status: AC

## 2024-05-28 NOTE — Progress Notes (Signed)
 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 Previous rifaximin  trials were ineffective. Discussed gut bacteria's role in gas production and potential imbalance causing bloating. Rifaximin  aims to reduce bacterial overgrowth. Common side effect includes diarrhea, manageable with probiotics or yogurt. Treatment is a trial due to lack of specialized tests locally. - Initiate rifaximin  for suspected SIBO. - Monitor for improvement in bloating symptoms. - Advise on potential side effects, including diarrhea, and recommend probiotics if needed.  Obstructive sleep apnea Uses CPAP. Discussed potential CPAP-related bloating due to air intake. No recent follow-up with a sleep specialist. Experiences discomfort with CPAP and adjusts mask during the night. - Discuss CPAP-related bloating with sleep specialist at next appointment.  Chronic combined systolic and diastolic heart failure No acute symptoms, recent exacerbations, or hospitalizations. Previous evaluations noted mild pulmonary hypertension and precapillary pulmonary issues.  Atherosclerotic heart disease of native coronary artery without angina No chest pain or angina symptoms. Managed with atorvastatin  and clopidogrel .  Peripheral vascular disease Remains active, walking over two miles and playing pickleball.  Chronic kidney disease, stage 3a Monitoring kidney function with lab tests including phosphorus and vitamin D  levels. No acute changes reported. - Order lab tests to monitor kidney function, including phosphorus and vitamin D  levels.  Benign prostatic hyperplasia with lower urinary tract symptoms Monitoring PSA levels as part of routine evaluation. - Order PSA test to monitor BPH.  Essential hypertension Well-controlled with lisinopril  and hydrochlorothiazide . Blood  pressure readings are stable.  Mixed hyperlipidemia Managed with atorvastatin . No recent lipid panel results discussed. - Order lipid panel to monitor cholesterol levels.     There are no discontinued medications.  Patient Counseling(The following topics were reviewed and/or handout was given):  -Nutrition: Stressed importance of moderation in sodium/caffeine intake, saturated fat and cholesterol, caloric balance, sufficient intake of fresh fruits, vegetables, and fiber.  -Stressed the importance of regular exercise.   -Substance Abuse: Discussed cessation/primary prevention of tobacco, alcohol, or other drug use; driving or other dangerous activities under the influence; availability of treatment for abuse.   -Injury prevention: Discussed safety belts, safety helmets, smoke detector, smoking near bedding or upholstery.   -Sexuality: Discussed sexually transmitted diseases, partner selection, use of condoms, avoidance of unintended pregnancy and contraceptive alternatives.   -Dental health: Discussed importance of regular tooth brushing, flossing, and dental visits.  -Health maintenance and immunizations reviewed. Please refer to Health maintenance section.  Return in about 6 months (around 11/27/2024) for BP, HLD, fasting labs.        Subjective:   Encounter date: 05/28/2024  Chief Complaint  Patient presents with   Abdominal Pain    Pt stated abdominal bloating is still present; no vomiting or diarrhea is present   HM due- vaccinations     Discussed the use of AI scribe software for clinical note transcription with the patient, who gave verbal consent to proceed.  History of Present Illness Patrick Tucker PB is a 73 year old male who presents with chronic abdominal bloating.  He experiences persistent abdominal bloating despite previous consultations with multiple GI specialists. He has not yet started a trial of rifaximin  due to concerns about side effects, particularly  diarrhea. He uses a CPAP machine for sleep apnea and wonders if it might be contributing to his bloating by blowing air into his stomach.  He describes experiencing shortness of breath, which is noticeable during physical activities  like walking and can also occur while sitting. The shortness of breath tends to improve as he continues walking. He sometimes feels a difference in his breathing when he pushes his stomach in. He has a history of peripheral arterial disease, coronary artery disease, and heart failure. He recalls a previous diagnosis of mild pulmonary hypertension and has undergone a bronchoscopy and aspiration in the past, which showed improvement since a prior pneumonia episode. No fevers, chills, or cough.  He maintains regular bowel movements without constipation and denies any chest pain. He is active, walking over two miles and playing pickleball regularly. He is on atorvastatin  80 mg for cholesterol, clopidogrel  for antiplatelet therapy, lisinopril  and hydrochlorothiazide  for blood pressure, and Protonix  for heartburn.       05/28/2024    9:56 AM 12/29/2023    1:14 PM 06/05/2023   11:43 PM 02/14/2023   10:58 AM 08/02/2022   11:22 AM  Depression screen PHQ 2/9  Decreased Interest 0 0 0 0 0  Down, Depressed, Hopeless 0 0 0 0 0  PHQ - 2 Score 0 0 0 0 0  Altered sleeping  0     Tired, decreased energy  0     Change in appetite  0     Feeling bad or failure about yourself   0     Trouble concentrating  0     Moving slowly or fidgety/restless  0     Suicidal thoughts  0     PHQ-9 Score  0     Difficult doing work/chores  Not difficult at all          12/29/2023    1:15 PM  GAD 7 : Generalized Anxiety Score  Nervous, Anxious, on Edge 0  Control/stop worrying 0  Worry too much - different things 0  Trouble relaxing 0  Restless 0  Easily annoyed or irritable 0  Afraid - awful might happen 0  Total GAD 7 Score 0  Anxiety Difficulty Not difficult at all    Health  Maintenance Due  Topic Date Due   Pneumococcal Vaccine: 50+ Years (1 of 2 - PCV) Never done   Zoster Vaccines- Shingrix (1 of 2) Never done   COVID-19 Vaccine (6 - 2024-25 season) 06/01/2023   Medicare Annual Wellness (AWV)  02/14/2024   INFLUENZA VACCINE  04/30/2024       PMH:  The following were reviewed and entered/updated in epic: Past Medical History:  Diagnosis Date   Hyperlipidemia    Hypertension 2010   Myocardial infarction (HCC) 2015   Prediabetes 2015   ST elevation myocardial infarction (STEMI) [facility])     Patient Active Problem List   Diagnosis Date Noted   Stage 3a chronic kidney disease (HCC) 04/16/2024   BPH associated with nocturia 04/16/2024   Small intestinal bacterial overgrowth (SIBO) 04/16/2024   Dyspnea on exertion 12/29/2023   Financial insecurity due to medical expenses 12/29/2023   Abdominal bloating 12/29/2023   Stage 2 chronic kidney disease 12/29/2023   Anemia 06/07/2021   Elevated CK 06/06/2021   Myalgia 06/06/2021   BMI 23.0-23.9, adult 06/06/2021   OSA (obstructive sleep apnea) 04/05/2021   Chronic heart failure (HCC) 05/11/2020   Hyperlipidemia    Vitamin D  deficiency    Former smoker    CAD (coronary artery disease)    PAD (peripheral artery disease) (HCC)    Hypertension 2010    Past Surgical History:  Procedure Laterality Date   CORONARY STENT INTERVENTION N/A 05/05/2020  Procedure: CORONARY STENT INTERVENTION;  Surgeon: Burnard Debby LABOR, MD;  Location: [facility] INVASIVE CV LAB;  Service: Cardiovascular;  Laterality: N/A;   CORONARY/GRAFT ACUTE MI REVASCULARIZATION N/A 05/05/2020   Procedure: Coronary/Graft Acute MI Revascularization;  Surgeon: Burnard Debby LABOR, MD;  Location: [facility] INVASIVE CV LAB;  Service: Cardiovascular;  Laterality: N/A;   LEFT HEART CATH AND CORONARY ANGIOGRAPHY N/A 05/05/2020   Procedure: LEFT HEART CATH AND CORONARY ANGIOGRAPHY;  Surgeon: Burnard Debby LABOR, MD;  Location: [facility] INVASIVE CV LAB;  Service: Cardiovascular;   Laterality: N/A;    Family History  Problem Relation Age of Onset   Cancer Mother    Hypertension Father    Heart disease Father    Diabetes Sister    Stroke Brother    COPD Brother    Hypertension Brother    COPD Brother     Medications- reviewed and updated Outpatient Medications Prior to Visit  Medication Sig Dispense Refill   ascorbic acid (VITAMIN C ) 1000 MG tablet Take 1,000 mg by mouth daily.     atorvastatin  (LIPITOR ) 80 MG tablet Take 1 tablet by mouth daily.     Cholecalciferol (VITAMIN D -3) 125 MCG (5000 UT) TABS Take 1 tablet by mouth daily.     clopidogrel  (PLAVIX ) 75 MG tablet Take   1 tablet   Daily     to Prevent Blood Clots 90 tablet 0   fluconazole  (DIFLUCAN ) 150 MG tablet Take 1 tablet (150 mg total) by mouth once a week for 26 doses. 26 tablet 0   lisinopril -hydrochlorothiazide  (ZESTORETIC ) 20-12.5 MG tablet Take 1 tablet by mouth daily. 90 tablet 3   Multiple Vitamin (MULTIVITAMIN) tablet Take 1 tablet by mouth daily.     pantoprazole  (PROTONIX ) 40 MG tablet Take  1 tablet  Daily  to Prevent Indigestion & Heartburn 90 tablet 3   vitamin E 45 MG (100 UNITS) capsule Take 100 Units by mouth daily.     No facility-administered medications prior to visit.    Allergies  Allergen Reactions   Coreg  [Carvedilol ] Hives and Itching   Atorvastatin      Myalgia    Social History   Socioeconomic History   Marital status: Married    Spouse name: sarah   Number of children: 1   Years of education: Not on file   Highest education level: Not on file  Occupational History   Not on file  Tobacco Use   Smoking status: Former    Current packs/day: 0.00    Types: Cigarettes    Quit date: 2000    Years since quitting: 25.6   Smokeless tobacco: Never  Vaping Use   Vaping status: Never Used  Substance and Sexual Activity   Alcohol use: Not Currently   Drug use: Never   Sexual activity: Not Currently    Birth control/protection: None  Other Topics Concern   Not  on file  Social History Narrative   Not on file   Social Drivers of Health   Financial Resource Strain: Not on file  Food Insecurity: No Food Insecurity (01/27/2024)   Hunger Vital Sign    Worried About Running Out of Food in the Last Year: Never true    Ran Out of Food in the Last Year: Never true  Transportation Needs: No Transportation Needs (01/27/2024)   PRAPARE - Administrator, Civil Service (Medical): No    Lack of Transportation (Non-Medical): No  Physical Activity: Not on file  Stress: Not on file  Social Connections:  Not on file           Objective:  Physical Exam: BP 122/66 (BP Location: Left Arm, Patient Position: Sitting, Cuff Size: Normal)   Pulse 60   Temp (!) 97.3 F (36.3 C) (Temporal)   Resp 18   Wt 169 lb 3.2 oz (76.7 kg)   SpO2 97%   BMI 24.99 kg/m   Body mass index is 24.99 kg/m. Wt Readings from Last 3 Encounters:  05/28/24 169 lb 3.2 oz (76.7 kg)  04/16/24 169 lb (76.7 kg)  01/27/24 165 lb (74.8 kg)    Physical Exam GENERAL: Alert, cooperative, well developed, no acute distress. HEENT: Normocephalic, normal oropharynx, moist mucous membranes. CHEST: Clear to auscultation bilaterally, no wheezes, rhonchi, or crackles. CARDIOVASCULAR: Normal heart rate and rhythm, S1 and S2 normal without murmurs. ABDOMEN: Bloated, non-tender to palpation, non-distended, without organomegaly, normal bowel sounds. EXTREMITIES: No cyanosis or edema. NEUROLOGICAL: Cranial nerves grossly intact, moves all extremities without gross motor or sensory deficit.  Physical Exam      Prior labs:   No results found for this or any previous visit (from the past 2160 hours).  Lab Results  Component Value Date   CHOL 105 09/09/2023   CHOL 103 06/06/2023   CHOL 124 02/14/2023   Lab Results  Component Value Date   HDL 40 09/09/2023   HDL 39 (L) 06/06/2023   HDL 38 (L) 02/14/2023   Lab Results  Component Value Date   LDLCALC 47 09/09/2023    LDLCALC 43 06/06/2023   LDLCALC 67 02/14/2023   Lab Results  Component Value Date   TRIG 99 09/09/2023   TRIG 127 06/06/2023   TRIG 109 02/14/2023   Lab Results  Component Value Date   CHOLHDL 2.6 09/09/2023   CHOLHDL 2.6 06/06/2023   CHOLHDL 3.3 02/14/2023   No results found for: LDLDIRECT  Last metabolic panel Lab Results  Component Value Date   GLUCOSE 96 09/09/2023   NA 139 09/09/2023   K 4.5 09/09/2023   CL 100 09/09/2023   CO2 30 09/09/2023   BUN 19 09/09/2023   CREATININE 1.18 09/09/2023   EGFR 66 09/09/2023   CALCIUM  9.8 09/09/2023   PROT 6.7 09/09/2023   BILITOT 0.6 09/09/2023   AST 26 09/09/2023   ALT 41 09/09/2023   ANIONGAP 9 07/09/2023    Lab Results  Component Value Date   HGBA1C 6.5 (H) 09/09/2023    Last CBC Lab Results  Component Value Date   WBC 4.7 09/09/2023   HGB 14.0 09/09/2023   HCT 42.6 09/09/2023   MCV 91.8 09/09/2023   MCH 30.2 09/09/2023   RDW 13.1 09/09/2023   PLT 202 09/09/2023    Lab Results  Component Value Date   TSH 1.01 06/06/2023    Lab Results  Component Value Date   PSA 1.41 06/06/2023   PSA 0.72 04/15/2022   PSA 0.80 03/05/2021    Last vitamin D  Lab Results  Component Value Date   VD25OH 78 06/06/2023    Lab Results  Component Value Date   PROTEINUR NEGATIVE 06/06/2023   LEUKOCYTESUR NEGATIVE 06/06/2023    Lab Results  Component Value Date   MICROALBUR 0.2 06/06/2023   MICROALBUR <0.2 04/15/2022     At today's visit, we discussed treatment options, associated risk and benefits, and engage in counseling as needed.  Additionally the following were reviewed: Past medical records, past medical and surgical history, family and social background, as well as relevant laboratory results, imaging  findings, and specialty notes, where applicable.  This message was generated using dictation software, and as a result, it may contain unintentional typos or errors.  Nevertheless, extensive effort was made  to accurately convey at the pertinent aspects of the patient visit.    There may have been are other unrelated non-urgent complaints, but due to the busy schedule and the amount of time already spent with him, time does not permit to address these issues at today's visit. Another appointment may have or has been requested to review these additional issues.     Arvella Hummer, MD, MS

## 2024-05-28 NOTE — Patient Instructions (Signed)
  VISIT SUMMARY: Today, we discussed your ongoing abdominal bloating and other health concerns. We reviewed your symptoms, current medications, and lifestyle. We also talked about potential treatments and follow-up plans for your conditions.  YOUR PLAN: -CHRONIC ABDOMINAL BLOATING AND SUSPECTED SMALL INTESTINAL BACTERIAL OVERGROWTH (SIBO): Your chronic abdominal bloating may be due to an imbalance of gut bacteria, known as small intestinal bacterial overgrowth (SIBO). We will start you on rifaximin  to reduce this bacterial overgrowth. Be aware that a common side effect is diarrhea, which can be managed with probiotics or yogurt. Please monitor your symptoms and let us  know if you experience any side effects.  -OBSTRUCTIVE SLEEP APNEA: Your CPAP machine, used for sleep apnea, might be contributing to your bloating by blowing air into your stomach. Please discuss this with your sleep specialist at your next appointment to explore possible adjustments.  -CHRONIC COMBINED SYSTOLIC AND DIASTOLIC HEART FAILURE: Your heart failure involves both the heart's ability to pump and fill with blood. There are no acute symptoms or recent exacerbations, and your condition remains stable.  -ATHEROSCLEROTIC HEART DISEASE OF NATIVE CORONARY ARTERY WITHOUT ANGINA: This condition involves the buildup of plaque in your coronary arteries but without chest pain. It is being managed with atorvastatin  and clopidogrel .  -PERIPHERAL VASCULAR DISEASE: This condition affects blood flow to your limbs. You remain active, which is beneficial for managing this condition.  -CHRONIC KIDNEY DISEASE, STAGE 3A: This is a moderate decrease in kidney function. We will monitor your kidney function with lab tests, including phosphorus and vitamin D  levels.  -BENIGN PROSTATIC HYPERPLASIA WITH LOWER URINARY TRACT SYMPTOMS: This condition involves an enlarged prostate that can affect urination. We will monitor your PSA levels as part of your  routine evaluation.  -ESSENTIAL HYPERTENSION: Your high blood pressure is well-controlled with your current medications, lisinopril  and hydrochlorothiazide .  -MIXED HYPERLIPIDEMIA: This condition involves having high levels of different types of fats in your blood. It is managed with atorvastatin . We will order a lipid panel to monitor your cholesterol levels.  INSTRUCTIONS: Please start taking rifaximin  as prescribed and monitor for any side effects. Schedule a follow-up appointment with your sleep specialist to discuss CPAP-related bloating. We will also need to conduct lab tests to monitor your kidney function, PSA levels, and cholesterol levels. Continue with your current medications and maintain your active lifestyle.

## 2024-05-29 LAB — NO CULTURE INDICATED

## 2024-05-29 LAB — URINALYSIS W MICROSCOPIC + REFLEX CULTURE
Bacteria, UA: NONE SEEN /HPF
Bilirubin Urine: NEGATIVE
Glucose, UA: NEGATIVE
Hgb urine dipstick: NEGATIVE
Ketones, ur: NEGATIVE
Leukocyte Esterase: NEGATIVE
Nitrites, Initial: NEGATIVE
Protein, ur: NEGATIVE
RBC / HPF: NONE SEEN /HPF (ref 0–2)
Specific Gravity, Urine: 1.014 (ref 1.001–1.035)
Squamous Epithelial / HPF: NONE SEEN /HPF (ref <=5)
WBC, UA: NONE SEEN /HPF (ref 0–5)
pH: 6 (ref 5.0–8.0)

## 2024-05-29 LAB — IRON,TIBC AND FERRITIN PANEL
%SAT: 32 % (ref 20–48)
Ferritin: 116 ng/mL (ref 24–380)
Iron: 92 ug/dL (ref 50–180)
TIBC: 286 ug/dL (ref 250–425)

## 2024-05-29 LAB — PARATHYROID HORMONE, INTACT (NO CA): PTH: 38 pg/mL (ref 16–77)

## 2024-06-03 ENCOUNTER — Telehealth: Payer: Self-pay

## 2024-06-03 NOTE — Telephone Encounter (Signed)
 Spoke to patient and informed him that upcoming OV is for a  AWV. Patient verbalized understanding and a thank you for clarification.

## 2024-06-03 NOTE — Telephone Encounter (Signed)
 Copied from CRM [id number]. Topic: Appointments - Scheduling Inquiry for Clinic >> Jun 03, 2024 10:55 AM Vena HERO wrote: Reason for CRM: Pt calling to confirm appt for tomorrow morning however he is not sure he needs to come in. He states he had an appt last week and said he had blood work and a check up that he thought was a physical and isn't sure what tomorrows appt should be for because he was told to follow up in 4 weeks. Please call pt to advise

## 2024-06-04 ENCOUNTER — Ambulatory Visit

## 2024-06-04 VITALS — BP 104/60 | HR 67 | Temp 98.7°F | Ht 69.5 in | Wt 171.2 lb

## 2024-06-04 DIAGNOSIS — Z0000 Encounter for general adult medical examination without abnormal findings: Secondary | ICD-10-CM | POA: Diagnosis not present

## 2024-06-04 NOTE — Progress Notes (Signed)
 Subjective:   Patrick Tucker is a 73 y.o. who presents for a Medicare Wellness preventive visit.  As a reminder, Annual Wellness Visits don't include a physical exam, and some assessments may be limited, especially if this visit is performed virtually. We may recommend an in-person follow-up visit with your provider if needed.  Visit Complete: In person    Persons Participating in Visit: Patient.  AWV Questionnaire: No: Patient Medicare AWV questionnaire was not completed prior to this visit.  Cardiac Risk Factors include: advanced age (>41men, >76 women);dyslipidemia;hypertension;male gender     Objective:    Today's Vitals   06/04/24 0920  BP: 104/60  Pulse: 67  Temp: 98.7 F (37.1 C)  TempSrc: Oral  SpO2: 95%  Weight: 171 lb 3.2 oz (77.7 kg)  Height: 5' 9.5 (1.765 m)   Body mass index is 24.92 kg/m.     06/04/2024    9:34 AM 07/09/2023   11:33 AM 06/06/2021    3:20 PM 03/01/2021    8:39 PM 05/06/2020    1:00 AM 05/01/2020    4:40 PM  Advanced Directives  Does Patient Have a Medical Advance Directive? No No No No No No  Would patient like information on creating a medical advance directive?   No - Patient declined Yes (MAU/Ambulatory/Procedural Areas - Information given) Yes (Inpatient - patient defers creating a medical advance directive at this time - Information given) Yes (MAU/Ambulatory/Procedural Areas - Information given)    Current Medications (verified) Outpatient Encounter Medications as of 06/04/2024  Medication Sig   ascorbic acid (VITAMIN C ) 1000 MG tablet Take 1,000 mg by mouth daily.   atorvastatin  (LIPITOR ) 80 MG tablet Take 1 tablet by mouth daily.   Cholecalciferol (VITAMIN D -3) 125 MCG (5000 UT) TABS Take 1 tablet by mouth daily.   clopidogrel  (PLAVIX ) 75 MG tablet Take   1 tablet   Daily     to Prevent Blood Clots   fluconazole  (DIFLUCAN ) 150 MG tablet Take 1 tablet (150 mg total) by mouth once a week for 26 doses.   lisinopril -hydrochlorothiazide   (ZESTORETIC ) 20-12.5 MG tablet Take 1 tablet by mouth daily.   Multiple Vitamin (MULTIVITAMIN) tablet Take 1 tablet by mouth daily.   pantoprazole  (PROTONIX ) 40 MG tablet Take  1 tablet  Daily  to Prevent Indigestion & Heartburn   vitamin E 45 MG (100 UNITS) capsule Take 100 Units by mouth daily.   rifaximin  (XIFAXAN ) 200 MG tablet Take 2 tablets (400 mg total) by mouth 3 (three) times daily for 10 days. (Patient not taking: Reported on 06/04/2024)   No facility-administered encounter medications on file as of 06/04/2024.    Allergies (verified) Coreg  [carvedilol ] and Atorvastatin    History: Past Medical History:  Diagnosis Date   Hyperlipidemia    Hypertension 2010   Myocardial infarction [facility]) 2015   Prediabetes 2015   ST elevation myocardial infarction (STEMI) [facility])    Past Surgical History:  Procedure Laterality Date   CORONARY STENT INTERVENTION N/A 05/05/2020   Procedure: CORONARY STENT INTERVENTION;  Surgeon: Burnard Debby LABOR, MD;  Location: [facility] INVASIVE CV LAB;  Service: Cardiovascular;  Laterality: N/A;   CORONARY/GRAFT ACUTE MI REVASCULARIZATION N/A 05/05/2020   Procedure: Coronary/Graft Acute MI Revascularization;  Surgeon: Burnard Debby LABOR, MD;  Location: [facility] INVASIVE CV LAB;  Service: Cardiovascular;  Laterality: N/A;   LEFT HEART CATH AND CORONARY ANGIOGRAPHY N/A 05/05/2020   Procedure: LEFT HEART CATH AND CORONARY ANGIOGRAPHY;  Surgeon: Burnard Debby LABOR, MD;  Location: [facility] INVASIVE CV LAB;  Service: Cardiovascular;  Laterality: N/A;   Family History  Problem Relation Age of Onset   Cancer Mother    Hypertension Father    Heart disease Father    Diabetes Sister    Stroke Brother    COPD Brother    Hypertension Brother    COPD Brother    Social History   Socioeconomic History   Marital status: Married    Spouse name: sarah   Number of children: 1   Years of education: Not on file   Highest education level: Not on file  Occupational History   Not on file  Tobacco Use    Smoking status: Former    Current packs/day: 0.00    Types: Cigarettes    Quit date: 2000    Years since quitting: 25.6   Smokeless tobacco: Never  Vaping Use   Vaping status: Never Used  Substance and Sexual Activity   Alcohol use: Not Currently   Drug use: Never   Sexual activity: Not Currently    Birth control/protection: None  Other Topics Concern   Not on file  Social History Narrative   Not on file   Social Drivers of Health   Financial Resource Strain: Low Risk  (06/04/2024)   Overall Financial Resource Strain (CARDIA)    Difficulty of Paying Living Expenses: Not hard at all  Food Insecurity: No Food Insecurity (06/04/2024)   Hunger Vital Sign    Worried About Running Out of Food in the Last Year: Never true    Ran Out of Food in the Last Year: Never true  Transportation Needs: No Transportation Needs (06/04/2024)   PRAPARE - Administrator, Civil Service (Medical): No    Lack of Transportation (Non-Medical): No  Physical Activity: Sufficiently Active (06/04/2024)   Exercise Vital Sign    Days of Exercise per Week: 5 days    Minutes of Exercise per Session: 120 min  Stress: No Stress Concern Present (06/04/2024)   [employer] of Occupational Health - Occupational Stress Questionnaire    Feeling of Stress: Not at all  Social Connections: Moderately Integrated (06/04/2024)   Social Connection and Isolation Panel    Frequency of Communication with Friends and Family: More than three times a week    Frequency of Social Gatherings with Friends and Family: More than three times a week    Attends Religious Services: More than 4 times per year    Active Member of [employer] or Organizations: No    Attends Banker Meetings: Never    Marital Status: Married    Tobacco Counseling Counseling given: Not Answered    Clinical Intake:  Pre-visit preparation completed: Yes  Pain : No/denies pain     Nutritional Status: BMI of 19-24  Normal Nutritional  Risks: None Diabetes: No  Lab Results  Component Value Date   HGBA1C 7.1 (H) 05/28/2024   HGBA1C 6.5 (H) 09/09/2023   HGBA1C 6.6 (H) 06/06/2023     How often do you need to have someone help you when you read instructions, pamphlets, or other written materials from your doctor or pharmacy?: 1 - Never  Interpreter Needed?: No  Information entered by :: NAllen LPN   Activities of Daily Living     06/04/2024    9:26 AM  In your present state of health, do you have any difficulty performing the following activities:  Hearing? 0  Vision? 0  Difficulty concentrating or making decisions? 0  Walking or climbing stairs? 0  Dressing or bathing? 0  Doing errands, shopping? 0  Preparing Food and eating ? N  Using the Toilet? N  In the past six months, have you accidently leaked urine? N  Do you have problems with loss of bowel control? N  Managing your Medications? N  Managing your Finances? N  Housekeeping or managing your Housekeeping? N    Patient Care Team: Sebastian Beverley NOVAK, MD as PCP - General (Family Medicine) Burnard Debby LABOR, MD (Inactive) as PCP - Cardiology (Cardiology) Neysa Reggy BIRCH, MD as Consulting Physician (Pulmonary Disease) Silva Juliene SAUNDERS, DPM as Consulting Physician (Podiatry)  I have updated your Care Teams any recent Medical Services you may have received from other providers in the past year.     Assessment:   This is a routine wellness examination for [facility].  Hearing/Vision screen Hearing Screening - Comments:: Denies hearing issues Vision Screening - Comments:: Regular eye exams, Eye Care   Goals Addressed             This Visit's Progress    Patient Stated       06/04/2024, wants to find out what's going on with stomach       Depression Screen     06/04/2024    9:35 AM 05/28/2024    9:56 AM 12/29/2023    1:14 PM 06/05/2023   11:43 PM 02/14/2023   10:58 AM 08/02/2022   11:22 AM 04/14/2022   11:21 PM  PHQ 2/9 Scores  PHQ - 2 Score 0 0 0 0  0 0 0  PHQ- 9 Score 3  0        Fall Risk     06/04/2024    9:35 AM 05/28/2024    9:56 AM 01/27/2024    9:57 AM 01/13/2024   10:06 AM 12/29/2023    1:14 PM  Fall Risk   Falls in the past year? 0 0 0 0 0  Number falls in past yr: 0 0  0 0  Injury with Fall? 0 0   0  Risk for fall due to : Medication side effect No Fall Risks   No Fall Risks  Follow up Falls evaluation completed;Falls prevention discussed Falls evaluation completed   Falls evaluation completed    MEDICARE RISK AT HOME:  Medicare Risk at Home Any stairs in or around the home?: Yes If so, are there any without handrails?: No Home free of loose throw rugs in walkways, pet beds, electrical cords, etc?: Yes Adequate lighting in your home to reduce risk of falls?: Yes Life alert?: No Use of a cane, walker or w/c?: No Grab bars in the bathroom?: No Shower chair or bench in shower?: No Elevated toilet seat or a handicapped toilet?: Yes  TIMED UP AND GO:  Was the test performed?  Yes  Length of time to ambulate 10 feet: 5  sec Gait steady and fast without use of assistive device  Cognitive Function: 6CIT completed        06/04/2024    9:37 AM  6CIT Screen  What Year? 0 points  What month? 0 points  What time? 0 points  Count back from 20 0 points  Months in reverse 0 points  Repeat phrase 6 points  Total Score 6 points    Immunizations Immunization History  Administered Date(s) Administered   PFIZER(Purple Top)SARS-COV-2 Vaccination 11/04/2019, 11/25/2019, 07/09/2020, 04/21/2021   Pfizer Covid-19 Vaccine Bivalent Booster 68yrs & up 08/04/2021   Tetanus 09/30/2014    Screening Tests  Health Maintenance  Topic Date Due   Pneumococcal Vaccine: 50+ Years (1 of 2 - PCV) Never done   Zoster Vaccines- Shingrix (1 of 2) Never done   Influenza Vaccine  Never done   COVID-19 Vaccine (6 - 2025-26 season) 05/31/2024   DTaP/Tdap/Td (1 - Tdap) 12/28/2024 (Originally 10/01/2014)   Medicare Annual Wellness (AWV)   06/04/2025   Colonoscopy  07/19/2031   Hepatitis C Screening  Completed   HPV VACCINES  Aged Out   Meningococcal B Vaccine  Aged Out    Health Maintenance  Health Maintenance Due  Topic Date Due   Pneumococcal Vaccine: 50+ Years (1 of 2 - PCV) Never done   Zoster Vaccines- Shingrix (1 of 2) Never done   Influenza Vaccine  Never done   COVID-19 Vaccine (6 - 2025-26 season) 05/31/2024   Health Maintenance Items Addressed: Declines vaccines.  Additional Screening:  Vision Screening: Recommended annual ophthalmology exams for early detection of glaucoma and other disorders of the eye. Would you like a referral to an eye doctor? No    Dental Screening: Recommended annual dental exams for proper oral hygiene  Community Resource Referral / Chronic Care Management: CRR required this visit?  No   CCM required this visit?  No   Plan:    I have personally reviewed and noted the following in the patient's chart:   Medical and social history Use of alcohol, tobacco or illicit drugs  Current medications and supplements including opioid prescriptions. Patient is not currently taking opioid prescriptions. Functional ability and status Nutritional status Physical activity Advanced directives List of other physicians Hospitalizations, surgeries, and ER visits in previous 12 months Vitals Screenings to include cognitive, depression, and falls Referrals and appointments  In addition, I have reviewed and discussed with patient certain preventive protocols, quality metrics, and best practice recommendations. A written personalized care plan for preventive services as well as general preventive health recommendations were provided to patient.   Ardella FORBES Dawn, LPN   [id number]   After Visit Summary: (In Person-Printed) AVS printed and given to the patient  Notes: Nothing significant to report at this time.

## 2024-06-04 NOTE — Patient Instructions (Signed)
 Mr. Patrick Tucker,  Thank you for taking the time for your Medicare Wellness Visit. I appreciate your continued commitment to your health goals. Please review the care plan we discussed, and feel free to reach out if I can assist you further.  Medicare recommends these wellness visits once per year to help you and your care team stay ahead of potential health issues. These visits are designed to focus on prevention, allowing your provider to concentrate on managing your acute and chronic conditions during your regular appointments.  Please note that Annual Wellness Visits do not include a physical exam. Some assessments may be limited, especially if the visit was conducted virtually. If needed, we may recommend a separate in-person follow-up with your provider.  Ongoing Care Seeing your primary care provider every 3 to 6 months helps us  monitor your health and provide consistent, personalized care.   Referrals If a referral was made during today's visit and you haven't received any updates within two weeks, please contact the referred provider directly to check on the status.  Recommended Screenings:  Health Maintenance  Topic Date Due   Pneumococcal Vaccine for age over 51 (1 of 2 - PCV) Never done   Zoster (Shingles) Vaccine (1 of 2) Never done   Flu Shot  Never done   COVID-19 Vaccine (6 - 2025-26 season) 05/31/2024   DTaP/Tdap/Td vaccine (1 - Tdap) 12/28/2024*   Medicare Annual Wellness Visit  06/04/2025   Colon Cancer Screening  07/19/2031   Hepatitis C Screening  Completed   HPV Vaccine  Aged Out   Meningitis B Vaccine  Aged Out  *Topic was postponed. The date shown is not the original due date.       06/04/2024    9:34 AM  Advanced Directives  Does Patient Have a Medical Advance Directive? No   Advance Care Planning is important because it: Ensures you receive medical care that aligns with your values, goals, and preferences. Provides guidance to your family and loved ones,  reducing the emotional burden of decision-making during critical moments.  Vision: Annual vision screenings are recommended for early detection of glaucoma, cataracts, and diabetic retinopathy. These exams can also reveal signs of chronic conditions such as diabetes and high blood pressure.  Dental: Annual dental screenings help detect early signs of oral cancer, gum disease, and other conditions linked to overall health, including heart disease and diabetes.  Please see the attached documents for additional preventive care recommendations.

## 2024-06-08 ENCOUNTER — Ambulatory Visit: Payer: Self-pay | Admitting: Family Medicine

## 2024-06-08 DIAGNOSIS — E1159 Type 2 diabetes mellitus with other circulatory complications: Secondary | ICD-10-CM

## 2024-06-08 DIAGNOSIS — E119 Type 2 diabetes mellitus without complications: Secondary | ICD-10-CM | POA: Insufficient documentation

## 2024-06-15 ENCOUNTER — Encounter: Payer: Medicare HMO | Admitting: Internal Medicine

## 2024-06-15 ENCOUNTER — Other Ambulatory Visit: Payer: Self-pay | Admitting: Family Medicine

## 2024-06-15 DIAGNOSIS — K638219 Small intestinal bacterial overgrowth, unspecified: Secondary | ICD-10-CM

## 2024-06-15 MED ORDER — METRONIDAZOLE 250 MG PO TABS: 500.0000 mg | ORAL_TABLET | Freq: Three times a day (TID) | ORAL | 0 refills | Status: AC

## 2024-06-15 NOTE — Progress Notes (Signed)
 Alternative to rifaximin  sent in due to prescription being available at his pharmacy.

## 2024-06-16 ENCOUNTER — Encounter: Payer: Self-pay | Admitting: Family Medicine

## 2024-06-16 ENCOUNTER — Ambulatory Visit: Admitting: Family Medicine

## 2024-06-16 VITALS — BP 111/61 | HR 70 | Temp 97.0°F | Resp 18 | Wt 169.0 lb

## 2024-06-16 DIAGNOSIS — E1159 Type 2 diabetes mellitus with other circulatory complications: Secondary | ICD-10-CM

## 2024-06-16 DIAGNOSIS — E673 Hypervitaminosis D: Secondary | ICD-10-CM

## 2024-06-16 DIAGNOSIS — K638219 Small intestinal bacterial overgrowth, unspecified: Secondary | ICD-10-CM

## 2024-06-16 DIAGNOSIS — E559 Vitamin D deficiency, unspecified: Secondary | ICD-10-CM | POA: Diagnosis not present

## 2024-06-16 LAB — MICROALBUMIN / CREATININE URINE RATIO
Creatinine,U: 164.9 mg/dL
Microalb Creat Ratio: 4.9 mg/g (ref 0.0–30.0)
Microalb, Ur: 0.8 mg/dL (ref 0.0–1.9)

## 2024-06-16 NOTE — Patient Instructions (Signed)
  VISIT SUMMARY: Today, we reviewed your recent lab results and discussed your ongoing health concerns, including diabetes management, abdominal bloating, and elevated vitamin D  levels.  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 -Increase physical activity and continue dietary management. -Consider GLP-1 or SGLT-2 inhibitors if A1c remains elevated. -Order microalbumin/creatinine ratio for annual screening. -Continue follow-up with podiatry for diabetic foot care. -Ensure you have an upcoming eye exam for diabetic retinopathy screening.  SMALL INTESTINAL BACTERIAL OVERGROWTH SYNDROME: You have persistent bloating that may be due to small intestinal bacterial overgrowth (SIBO). -Start taking metronidazole  as prescribed. -Consider alternative treatments like probiotics, berberine, and oil of oregano. -Follow a low FODMAP diet. -Consider seeing a nutritionist for dietary guidance.  HYPERVITAMINOSIS D: Your vitamin D  levels are high due to over-supplementation, but your kidney function and other related labs are stable. -Stop taking vitamin D  supplements. -Recheck your vitamin D  levels in the future.                      Contains text generated by [employer]bridge.                                 Contains text generated by Abridge.

## 2024-06-16 NOTE — Progress Notes (Signed)
 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 Vascular complications include peripheral vascular microischemic disease and CKD3A. Discussed potential benefits of GLP-1 or SGLT-2 inhibitors for cardiovascular and renal protection. Emphasized lifestyle modifications and dietary management. He prefers to manage diabetes through lifestyle changes before considering medication. - Encourage increased physical activity and dietary management - Discuss potential use of GLP-1 or SGLT-2 inhibitors if A1c remains elevated - Order microalbumin/creatinine ratio for annual screening - Continue follow-up with podiatry for diabetic foot care - Ensure upcoming eye exam for diabetic retinopathy screening  Small intestinal bacterial overgrowth syndrome Persistent symptoms of bloating suggestive of SIBO. Insurance did not cover rifaximin ; metronidazole  prescribed as an alternative. Discussed alternative treatments including probiotics, berberine, and oil of oregano. Emphasized low FODMAP diet and potential referral to nutrition for dietary guidance. He is open to trying natural remedies and dietary changes. - Start metronidazole  - Discuss alternative treatments including probiotics, berberine, and oil of oregano - Emphasize low FODMAP diet - Consider referral to nutrition for dietary guidance  Hypervitaminosis D Elevated vitamin D  levels due to over-supplementation. Kidney labs, parathyroid , calcium , and phosphorus levels are stable. - Instruct to stop vitamin D  supplementation - Recheck vitamin D  levels      There are no discontinued medications.  Return in about 3 months (around 09/15/2024) for DM.        Subjective:   Encounter date: 06/16/2024  Patrick Tucker is a 73 y.o. male who has Hypertension; Hyperlipidemia; Vitamin D  deficiency;  Former smoker; CAD (coronary artery disease); PAD (peripheral artery disease) (HCC); Chronic heart failure (HCC); OSA (obstructive sleep apnea); Elevated CK; Myalgia; BMI 23.0-23.9, adult; Anemia; Dyspnea on exertion; Financial insecurity due to medical expenses; Abdominal bloating; Stage 2 chronic kidney disease; Stage 3a chronic kidney disease (HCC); BPH associated with nocturia; Small intestinal bacterial overgrowth (SIBO); and Diabetes mellitus (HCC) on their problem list..   He  has a past medical history of Hyperlipidemia, Hypertension (2010), Myocardial infarction (HCC) (2015), Prediabetes (2015), and ST elevation myocardial infarction (STEMI) (HCC).SABRA   He presents with chief complaint of Medical Management of Chronic Issues (1 month follow up DM and vitamin D  management. Pt is not fasting today //HM due- vaccinations (patient declined) and diabetic eye exam ) .   Discussed the use of AI scribe software for clinical note transcription with the patient, who gave verbal consent to proceed.  History of Present Illness Patrick Tucker is a 73 year old male with hypervitaminosis D and type 2 diabetes mellitus with vascular complications who presents to discuss recent lab work.  Hypervitaminosis d - Recent laboratory evaluation indicates hypervitaminosis D attributed to over-supplementation. - Previously took vitamin D  twice daily; supplementation has been discontinued. - Renal function, parathyroid  hormone, serum calcium , and phosphorus levels remain stable.  Type 2 diabetes mellitus with vascular complications - Diagnosis of type 2 diabetes mellitus with associated peripheral vascular microischemic disease and stage 3A chronic kidney disease. - Recent hemoglobin A1c measured at 7.1%, slightly above target of <7%. - Diabetes was previously well-controlled. - Maintains an active lifestyle, walking three miles daily. - Focused on dietary management and regular exercise.  Abdominal  bloating - Persistent abdominal bloating, suspected secondary to small intestinal bacterial overgrowth (SIBO). - No hydrogen or methane breath testing performed to date. - Metronidazole  prescribed after insurance did not cover rifaximin . - Exploring dietary modifications and  natural remedies for symptom management.  Cardiovascular disease - History of heart disease.  Lower urinary tract symptoms - Mild symptoms consistent with benign prostatic hyperplasia (BPH).     ROS  Past Surgical History:  Procedure Laterality Date   CORONARY STENT INTERVENTION N/A 05/05/2020   Procedure: CORONARY STENT INTERVENTION;  Surgeon: Burnard Debby LABOR, MD;  Location: [facility];  Service: Cardiovascular;  Laterality: N/A;   CORONARY/GRAFT ACUTE MI REVASCULARIZATION N/A 05/05/2020   Procedure: Coronary/Graft Acute MI Revascularization;  Surgeon: Burnard Debby LABOR, MD;  Location: [facility];  Service: Cardiovascular;  Laterality: N/A;   LEFT HEART CATH AND CORONARY ANGIOGRAPHY N/A 05/05/2020   Procedure: LEFT HEART CATH AND CORONARY ANGIOGRAPHY;  Surgeon: Burnard Debby LABOR, MD;  Location: [facility];  Service: Cardiovascular;  Laterality: N/A;    Outpatient Medications Prior to Visit  Medication Sig Dispense Refill   ascorbic acid (VITAMIN C ) 1000 MG tablet Take 1,000 mg by mouth daily.     atorvastatin  (LIPITOR ) 80 MG tablet Take 1 tablet by mouth daily.     carvedilol  (COREG ) 25 MG tablet Take 25 mg by mouth.     clopidogrel  (PLAVIX ) 75 MG tablet Take   1 tablet   Daily     to Prevent Blood Clots 90 tablet 0   ezetimibe  (ZETIA ) 10 MG tablet Take 10 mg by mouth daily.     fluconazole  (DIFLUCAN ) 150 MG tablet Take 1 tablet (150 mg total) by mouth once a week for 26 doses. 26 tablet 0   lisinopril -hydrochlorothiazide  (ZESTORETIC ) 20-12.5 MG tablet Take 1 tablet by mouth daily. 90 tablet 3   metroNIDAZOLE  (FLAGYL ) 250 MG tablet Take 2 tablets (500 mg total) by mouth 3 (three) times daily for  7 days. 42 tablet 0   Multiple Vitamin (MULTIVITAMIN) tablet Take 1 tablet by mouth daily.     pantoprazole  (PROTONIX ) 40 MG tablet Take  1 tablet  Daily  to Prevent Indigestion & Heartburn 90 tablet 3   vitamin E 45 MG (100 UNITS) capsule Take 100 Units by mouth daily.     Cholecalciferol (VITAMIN D -3) 125 MCG (5000 UT) TABS Take 1 tablet by mouth daily. (Patient not taking: Reported on 06/16/2024)     No facility-administered medications prior to visit.    Family History  Problem Relation Age of Onset   Cancer Mother    Hypertension Father    Heart disease Father    Diabetes Sister    Stroke Brother    COPD Brother    Hypertension Brother    COPD Brother     Social History   Socioeconomic History   Marital status: Married    Spouse name: sarah   Number of children: 1   Years of education: Not on file   Highest education level: Not on file  Occupational History   Not on file  Tobacco Use   Smoking status: Former    Current packs/day: 0.00    Types: Cigarettes    Quit date: 2000    Years since quitting: 25.7   Smokeless tobacco: Never  Vaping Use   Vaping status: Never Used  Substance and Sexual Activity   Alcohol use: Not Currently   Drug use: Never   Sexual activity: Not Currently    Birth control/protection: None  Other Topics Concern   Not on file  Social History Narrative   Not on file   Social Drivers of Health   Financial Resource Strain: Low Risk  (  06/04/2024)   Overall Financial Resource Strain (CARDIA)    Difficulty of Paying Living Expenses: Not hard at all  Food Insecurity: No Food Insecurity (06/04/2024)   Hunger Vital Sign    Worried About Running Out of Food in the Last Year: Never true    Ran Out of Food in the Last Year: Never true  Transportation Needs: No Transportation Needs (06/04/2024)   PRAPARE - Administrator, Civil Service (Medical): No    Lack of Transportation (Non-Medical): No  Physical Activity: Sufficiently Active  (06/04/2024)   Exercise Vital Sign    Days of Exercise per Week: 5 days    Minutes of Exercise per Session: 120 min  Stress: No Stress Concern Present (06/04/2024)   [employer] of Occupational Health - Occupational Stress Questionnaire    Feeling of Stress: Not at all  Social Connections: Moderately Integrated (06/04/2024)   Social Connection and Isolation Panel    Frequency of Communication with Friends and Family: More than three times a week    Frequency of Social Gatherings with Friends and Family: More than three times a week    Attends Religious Services: More than 4 times per year    Active Member of [employer] or Organizations: No    Attends Banker Meetings: Never    Marital Status: Married  Catering manager Violence: Not At Risk (06/04/2024)   Humiliation, Afraid, Rape, and Kick questionnaire    Fear of Current or Ex-Partner: No    Emotionally Abused: No    Physically Abused: No    Sexually Abused: No                                                                                                  Objective:  Physical Exam: BP 111/61 (BP Location: Left Arm, Patient Position: Sitting, Cuff Size: Normal) Comment: recheck after resting  Pulse 70   Temp (!) 97 F (36.1 C) (Temporal)   Resp 18   Wt 169 lb (76.7 kg)   SpO2 95%   BMI 24.60 kg/m    Physical Exam VITALS: BP- 115/50 GENERAL: Alert, cooperative, well developed, no acute distress. HEENT: Normocephalic, normal oropharynx, moist mucous membranes. CHEST: Clear to auscultation bilaterally, no wheezes, rhonchi, or crackles. CARDIOVASCULAR: Normal heart rate and rhythm, S1 and S2 normal without murmurs. Blood pressure low, systolic 115, diastolic 50. ABDOMEN: Soft, non-tender, non-distended, without organomegaly, normal bowel sounds. EXTREMITIES: No cyanosis or edema. NEUROLOGICAL: Cranial nerves grossly intact, moves all extremities without gross motor or sensory deficit.   Physical Exam  No results  found.  Recent Results (from the past 2160 hours)  PSA     Status: None   Collection Time: 05/28/24 10:37 AM  Result Value Ref Range   PSA 1.88 0.10 - 4.00 ng/mL    Comment: Test performed using Access Hy[employer]itech PSA Assay, a parmagnetic partical, chemiluminecent immunoassay.  VITAMIN D  25 Hydroxy (Vit-D Deficiency, Fractures)     Status: Abnormal   Collection Time: 05/28/24 10:37 AM  Result Value Ref Range   VITD 104.50 (HH) 30.00 - 100.00 ng/mL  Parathyroid   hormone, intact (no Ca)     Status: None   Collection Time: 05/28/24 10:37 AM  Result Value Ref Range   PTH 38 16 - 77 pg/mL    Comment: . Interpretive Guide    Intact PTH           Calcium  ------------------    ----------           ------- Normal Parathyroid     Normal               Normal Hypoparathyroidism    Low or Low Normal    Low Hyperparathyroidism    Primary            Normal or High       High    Secondary          High                 Normal or Low    Tertiary           High                 High Non-Parathyroid     Hypercalcemia      Low or Low Normal    High .   Phosphorus     Status: None   Collection Time: 05/28/24 10:37 AM  Result Value Ref Range   Phosphorus 3.0 2.3 - 4.6 mg/dL  Iron, TIBC and Ferritin Panel     Status: None   Collection Time: 05/28/24 10:37 AM  Result Value Ref Range   Iron 92 50 - 180 mcg/dL   TIBC 713 749 - 574 mcg/dL (calc)   %SAT 32 20 - 48 % (calc)   Ferritin 116 24 - 380 ng/mL  Urinalysis w microscopic + reflex cultur     Status: Abnormal   Collection Time: 05/28/24 10:37 AM   Specimen: Blood  Result Value Ref Range   Color, Urine YELLOW YELLOW   APPearance CLEAR CLEAR   Specific Gravity, Urine 1.014 1.001 - 1.035   pH 6.0 5.0 - 8.0   Glucose, UA NEGATIVE NEGATIVE   Bilirubin Urine NEGATIVE NEGATIVE   Ketones, ur NEGATIVE NEGATIVE   Hgb urine dipstick NEGATIVE NEGATIVE   Protein, ur NEGATIVE NEGATIVE   Nitrites, Initial NEGATIVE NEGATIVE   Leukocyte Esterase NEGATIVE  NEGATIVE   WBC, UA NONE SEEN 0 - 5 /HPF   RBC / HPF NONE SEEN 0 - 2 /HPF   Squamous Epithelial / HPF NONE SEEN < OR = 5 /HPF   Bacteria, UA NONE SEEN NONE SEEN /HPF   Hyaline Cast 0-5 (A) NONE SEEN /LPF   Note      Comment: This urine was analyzed for the presence of WBC,  RBC, bacteria, casts, and other formed elements.  Only those elements seen were reported. . .   Urinalysis, Routine w reflex microscopic     Status: None   Collection Time: 05/28/24 10:37 AM  Result Value Ref Range   Color, Urine YELLOW Yellow;Lt. Yellow;Straw;Dark Yellow;Amber;Green;Red;Brown   APPearance CLEAR Clear;Turbid;Slightly Cloudy;Cloudy   Specific Gravity, Urine 1.015 1.000 - 1.030   pH 6.0 5.0 - 8.0   Total Protein, Urine NEGATIVE Negative   Urine Glucose NEGATIVE Negative   Ketones, ur NEGATIVE Negative   Bilirubin Urine NEGATIVE Negative   Hgb urine dipstick NEGATIVE Negative   Urobilinogen, UA 0.2 0.0 - 1.0   Leukocytes,Ua NEGATIVE Negative   Nitrite NEGATIVE Negative   WBC, UA 0-2/hpf 0-2/hpf   RBC /  HPF 0-2/hpf 0-2/hpf  TSH     Status: None   Collection Time: 05/28/24 10:37 AM  Result Value Ref Range   TSH 1.14 0.35 - 5.50 uIU/mL  Lipid panel     Status: Abnormal   Collection Time: 05/28/24 10:37 AM  Result Value Ref Range   Cholesterol 97 0 - 200 mg/dL    Comment: ATP III Classification       Desirable:  < 200 mg/dL               Borderline High:  200 - 239 mg/dL          High:  > = 759 mg/dL   Triglycerides 897.9 0.0 - 149.0 mg/dL    Comment: Normal:  <849 mg/dLBorderline High:  150 - 199 mg/dL   HDL 63.99 (L) >60.99 mg/dL   VLDL 79.5 0.0 - 59.9 mg/dL   LDL Cholesterol 41 0 - 99 mg/dL   Total CHOL/HDL Ratio 3     Comment:                Men          Women1/2 Average Risk     3.4          3.3Average Risk          5.0          4.42X Average Risk          9.6          7.13X Average Risk          15.0          11.0                       NonHDL 61.00     Comment: NOTE:  Non-HDL goal  should be 30 mg/dL higher than patient's LDL goal (i.e. LDL goal of < 70 mg/dL, would have non-HDL goal of < 100 mg/dL)  Hemoglobin J8r     Status: Abnormal   Collection Time: 05/28/24 10:37 AM  Result Value Ref Range   Hgb A1c MFr Bld 7.1 (H) 4.6 - 6.5 %    Comment: Glycemic Control Guidelines for People with Diabetes:Non Diabetic:  <6%Goal of Therapy: <7%Additional Action Suggested:  >8%   Comprehensive metabolic panel with GFR     Status: Abnormal   Collection Time: 05/28/24 10:37 AM  Result Value Ref Range   Sodium 143 135 - 145 mEq/L   Potassium 4.2 3.5 - 5.1 mEq/L   Chloride 103 96 - 112 mEq/L   CO2 30 19 - 32 mEq/L   Glucose, Bld 94 70 - 99 mg/dL   BUN 17 6 - 23 mg/dL   Creatinine, Ser 8.71 0.40 - 1.50 mg/dL   Total Bilirubin 0.7 0.2 - 1.2 mg/dL   Alkaline Phosphatase 102 39 - 117 U/L   AST 25 0 - 37 U/L   ALT 25 0 - 53 U/L   Total Protein 6.6 6.0 - 8.3 g/dL   Albumin 4.3 3.5 - 5.2 g/dL   GFR 43.40 (L) >39.99 mL/min    Comment: Calculated using the CKD-EPI Creatinine Equation (2021)   Calcium  9.0 8.4 - 10.5 mg/dL  CBC with Differential/Platelet     Status: Abnormal   Collection Time: 05/28/24 10:37 AM  Result Value Ref Range   WBC 3.8 (L) 4.0 - 10.5 K/uL   RBC 4.55 4.22 - 5.81 Mil/uL   Hemoglobin 13.4 13.0 - 17.0 g/dL  HCT 41.3 39.0 - 52.0 %   MCV 90.8 78.0 - 100.0 fl   MCHC 32.5 30.0 - 36.0 g/dL   RDW 85.8 88.4 - 84.4 %   Platelets 189.0 150.0 - 400.0 K/uL   Neutrophils Relative % 50.1 43.0 - 77.0 %   Lymphocytes Relative 36.5 12.0 - 46.0 %   Monocytes Relative 9.8 3.0 - 12.0 %   Eosinophils Relative 2.9 0.0 - 5.0 %   Basophils Relative 0.7 0.0 - 3.0 %   Neutro Abs 1.9 1.4 - 7.7 K/uL   Lymphs Abs 1.4 0.7 - 4.0 K/uL   Monocytes Absolute 0.4 0.1 - 1.0 K/uL   Eosinophils Absolute 0.1 0.0 - 0.7 K/uL   Basophils Absolute 0.0 0.0 - 0.1 K/uL  REFLEXIVE URINE CULTURE     Status: None   Collection Time: 05/28/24 10:37 AM  Result Value Ref Range   Reflexve Urine  Culture      Comment: NO CULTURE INDICATED        Beverley Adine Hummer, MD, MS

## 2024-06-21 LAB — VITAMIN D 1,25 DIHYDROXY
Vitamin D 1, 25 (OH)2 Total: 29 pg/mL (ref 18–72)
Vitamin D2 1, 25 (OH)2: <8 pg/mL
Vitamin D3 1, 25 (OH)2: 29 pg/mL

## 2024-06-21 NOTE — Progress Notes (Unsigned)
 HPI M former smoker followed for OSA, complicated by CAD/ MI, HTN, PAD, CHF, Hyperlipidemia, Vit D deficiency NPSG 03/01/21- AHi 5.6/ hr, desaturation to 92%, body weight 161 lbs  ==============================================================  01/20/23-  71 yoM former smoker followed for OSA,  complicated by CAD/ MI, HTN, PAD, CHF, Hyperlipidemia, Vit D deficiency CPAP auto 4-15/ [facility]   ordered 04/05/21 Download-compliance 87%, AHI 3.8/hr Body weight today-167 lbs Download reviewed.  He is meeting compliance and control goals but says the mask is hurting his nose.  He sleeps better and is less tired with CPAP.  06/22/24-  72 yoM former smoker followed for OSA,  complicated by CAD/ MI, HTN, PAD, CHF, Hyperlipidemia, Vit D deficiency CPAP auto 4-15/ [facility]   ordered 04/05/21 Download-compliance 63%, AHI 3/hr    Pressure ranging 11.4-14.8 Body weight today-169 lbs Discussed the use of AI scribe software for clinical note transcription with the patient, who gave verbal consent to proceed.  History of Present Illness   Patrick Tucker PB is a 73 year old male with obstructive sleep apnea complicated by cardiac disease who presents with sleep disturbances and gastrointestinal discomfort.  He experiences significant sleep disturbances over the past three months, with only three nights of good sleep. He associates these disturbances with swallowing air from his CPAP machine, leading to discomfort and shortness of breath, especially when lying down.  He reports gastrointestinal discomfort, feeling air in his stomach related to CPAP use. This causes bloating after meals, such as after consuming a fruit bar, fruit cup, and yogurt for breakfast. Despite these symptoms, he remains active, playing pickleball and exercising regularly.     Assessment and Plan:    Obstructive sleep apnea complicated by aerophagia related to positive airway pressure therapy He experienced significant discomfort and disturbed sleep  due to aerophagia from positive airway pressure therapy. - Adjust CPAP pressure settings to reduce air swallowing. - Recommend over-the-counter simethicone for gas relief. - Send order to home care company for CPAP machine adjustment. - Instruct him to contact home care company if not contacted within a few days. - Advise him to report persistent symptoms.         ROS-see HPI   + = positive Constitutional:    weight loss, night sweats, fevers, chills, +fatigue, lassitude. HEENT:    headaches, difficulty swallowing, tooth/dental problems, sore throat,       sneezing, itching, ear ache, nasal congestion, post nasal drip, snoring CV:    chest pain, orthopnea, PND, swelling in lower extremities, anasarca,                                   dizziness, palpitations Resp:   shortness of breath with exertion or at rest.                productive cough,   non-productive cough, coughing up of blood.              change in color of mucus.  wheezing.   Skin:    rash or lesions. GI:  No-   heartburn, indigestion, abdominal pain, nausea, vomiting, diarrhea,                 change in bowel habits, loss of appetite GU: dysuria, change in color of urine, no urgency or frequency.   flank pain. MS:   joint pain, stiffness, decreased range of motion, back pain. Neuro-     nothing  unusual Psych:  change in mood or affect.  depression or anxiety.   memory loss.  OBJ- Physical Exam General- Alert, Oriented, Affect-appropriate, Distress- none acute, slender Skin- rash-none, lesions- none, excoriation- none Lymphadenopathy- none Head- atraumatic            Eyes- Gross vision intact, PERRLA, conjunctivae and secretions clear            Ears- Hearing, canals-normal            Nose- + is an irritated area on the bridge of his nose from CPAP mask pressure.            Throat- Mallampati III-IV , mucosa clear , drainage- none, tonsils- atrophic, +teeth Neck- flexible , trachea midline, no stridor , thyroid  nl,  carotid no bruit Chest - symmetrical excursion , unlabored           Heart/CV- RRR , no murmur , no gallop  , no rub, nl s1 s2                           - JVD- none , edema- none, stasis changes- none, varices- none           Lung- clear to P&A, wheeze- none, cough- none , dullness-none, rub- none           Chest wall-  Abd-  Br/ Gen/ Rectal- Not done, not indicated Extrem- cyanosis- none, clubbing, none, atrophy- none, strength- nl Neuro- grossly intact to observation

## 2024-06-22 ENCOUNTER — Ambulatory Visit: Admitting: Internal Medicine

## 2024-06-22 ENCOUNTER — Ambulatory Visit: Payer: Self-pay | Admitting: Family Medicine

## 2024-06-22 ENCOUNTER — Encounter: Payer: Self-pay | Admitting: Internal Medicine

## 2024-06-22 VITALS — BP 108/65 | HR 66 | Temp 98.1°F | Ht 69.5 in | Wt 169.8 lb

## 2024-06-22 DIAGNOSIS — G4733 Obstructive sleep apnea (adult) (pediatric): Secondary | ICD-10-CM | POA: Diagnosis not present

## 2024-06-22 NOTE — Patient Instructions (Signed)
 Order- DME [facility]- please reduce CPAP auto range to 4-10  You can try otc simethicone (Gas-X) and similar products, to break up gas.  Let us  know if you don't get comfortable

## 2024-07-13 ENCOUNTER — Ambulatory Visit: Admitting: Podiatry

## 2024-07-22 ENCOUNTER — Ambulatory Visit: Admitting: Podiatry

## 2024-09-08 NOTE — Progress Notes (Unsigned)
 HPI M former smoker followed for OSA, complicated by CAD/ MI, HTN, PAD, CHF, Hyperlipidemia, Vit D deficiency NPSG 03/01/21- AHi 5.6/ hr, desaturation to 92%, body weight 161 lbs  ==============================================================  01/20/23-  71 yoM former smoker followed for OSA,  complicated by CAD/ MI, HTN, PAD, CHF, Hyperlipidemia, Vit D deficiency CPAP auto 4-15/ [facility]   ordered 04/05/21 Download-compliance 87%, AHI 3.8/hr Body weight today-167 lbs Download reviewed.  He is meeting compliance and control goals but says the mask is hurting his nose.  He sleeps better and is less tired with CPAP.  06/22/24-  72 yoM former smoker followed for OSA,  complicated by CAD/ MI, HTN, PAD, CHF, Hyperlipidemia, Vit D deficiency CPAP auto 4-15/ [facility]   ordered 04/05/21 Download-compliance 63%, AHI 3/hr    Pressure ranging 11.4-14.8 Body weight today-169 lbs Discussed the use of AI scribe software for clinical note transcription with the patient, who gave verbal consent to proceed.  History of Present Illness   Patrick Tucker is a 73 year old male with obstructive sleep apnea complicated by cardiac disease who presents with sleep disturbances and gastrointestinal discomfort.  He experiences significant sleep disturbances over the past three months, with only three nights of good sleep. He associates these disturbances with swallowing air from his CPAP machine, leading to discomfort and shortness of breath, especially when lying down.  He reports gastrointestinal discomfort, feeling air in his stomach related to CPAP use. This causes bloating after meals, such as after consuming a fruit bar, fruit cup, and yogurt for breakfast. Despite these symptoms, he remains active, playing pickleball and exercising regularly.     Assessment and Plan:    Obstructive sleep apnea complicated by aerophagia related to positive airway pressure therapy He experienced significant discomfort and disturbed sleep  due to aerophagia from positive airway pressure therapy. - Adjust CPAP pressure settings to reduce air swallowing. - Recommend over-the-counter simethicone for gas relief. - Send order to home care company for CPAP machine adjustment. - Instruct him to contact home care company if not contacted within a few days. - Advise him to report persistent symptoms.      09/09/24- 73 yoM former smoker followed for OSA,  complicated by CAD/ MI, HTN, PAD, CHF, Hyperlipidemia, Vit D deficiency CPAP auto 4-15/ [facility]   ordered 04/05/21 Download-compliance - No DL today Body weight today- ------Sleep is good.  CPAP working well. Discussed the use of AI scribe software for clinical note transcription with the patient, who gave verbal consent to proceed.  History of Present Illness   Patrick Tucker is a 73 year old male who presents for follow-up regarding CPAP use.  He is sleeping well and is comfortable with his CPAP machine, using it every night without mechanical issues. CPAP pressure is set between 4 and 15 cm H2O.  He has stable daytime shortness of breath with walking uphill or carrying objects for a few minutes. No worsening, significant cough, wheezing, or new cardiac symptoms.     Assessment and Plan:    Obstructive sleep apnea Well-managed with CPAP therapy. Good sleep quality and comfort with current settings. Mild exertional dyspnea without symptom worsening. No new cardiac issues. - Continue CPAP therapy with pressure settings 4-15 cm H2O. - Referred to sleep specialist for ongoing management and potential CPAP adjustments.   Dyspnea on exertion -Cardiac meds to be reviewed by cardiology    Consider PFT and focused eval  ROS-see HPI   + = positive Constitutional:  weight loss, night sweats, fevers, chills, +fatigue, lassitude. HEENT:    headaches, difficulty swallowing, tooth/dental problems, sore throat,       sneezing, itching, ear ache, nasal congestion, post nasal drip,  snoring CV:    chest pain, orthopnea, PND, swelling in lower extremities, anasarca,                                   dizziness, palpitations Resp:   shortness of breath with exertion or at rest.                productive cough,   non-productive cough, coughing up of blood.              change in color of mucus.  wheezing.   Skin:    rash or lesions. GI:  No-   heartburn, indigestion, abdominal pain, nausea, vomiting, diarrhea,                 change in bowel habits, loss of appetite GU: dysuria, change in color of urine, no urgency or frequency.   flank pain. MS:   joint pain, stiffness, decreased range of motion, back pain. Neuro-     nothing unusual Psych:  change in mood or affect.  depression or anxiety.   memory loss.  OBJ- Physical Exam General- Alert, Oriented, Affect-appropriate, Distress- none acute, slender Skin- rash-none, lesions- none, excoriation- none Lymphadenopathy- none Head- atraumatic            Eyes- Gross vision intact, PERRLA, conjunctivae and secretions clear            Ears- Hearing, canals-normal            Nose- + is an irritated area on the bridge of his nose from CPAP mask pressure.            Throat- Mallampati III-IV , mucosa clear , drainage- none, tonsils- atrophic, +teeth Neck- flexible , trachea midline, no stridor , thyroid  nl, carotid no bruit Chest - symmetrical excursion , unlabored           Heart/CV- RRR , no murmur , no gallop  , no rub, nl s1 s2                           - JVD- none , edema- none, stasis changes- none, varices- none           Lung- clear to P&A, wheeze- none, cough- none , dullness-none, rub- none           Chest wall-  Abd-  Br/ Gen/ Rectal- Not done, not indicated Extrem- cyanosis- none, clubbing, none, atrophy- none, strength- nl Neuro- grossly intact to observation

## 2024-09-09 ENCOUNTER — Ambulatory Visit: Admitting: Internal Medicine

## 2024-09-09 ENCOUNTER — Encounter: Payer: Self-pay | Admitting: Internal Medicine

## 2024-09-09 VITALS — BP 126/62 | HR 58 | Temp 97.5°F | Ht 69.0 in | Wt 175.0 lb

## 2024-09-09 DIAGNOSIS — G4733 Obstructive sleep apnea (adult) (pediatric): Secondary | ICD-10-CM

## 2024-09-09 DIAGNOSIS — Z87891 Personal history of nicotine dependence: Secondary | ICD-10-CM | POA: Diagnosis not present

## 2024-09-09 DIAGNOSIS — R0609 Other forms of dyspnea: Secondary | ICD-10-CM | POA: Diagnosis not present

## 2024-09-09 DIAGNOSIS — I509 Heart failure, unspecified: Secondary | ICD-10-CM

## 2024-09-09 NOTE — Patient Instructions (Signed)
 We can continue CPAP auto 4-14. You are doing great.   At check out, please ask the front desk to bring you back with a sleep docttor.

## 2024-09-15 ENCOUNTER — Ambulatory Visit: Admitting: Family Medicine

## 2024-09-15 VITALS — BP 128/69 | HR 63 | Temp 98.2°F | Ht 69.0 in | Wt 174.2 lb

## 2024-09-15 DIAGNOSIS — I2510 Atherosclerotic heart disease of native coronary artery without angina pectoris: Secondary | ICD-10-CM

## 2024-09-15 DIAGNOSIS — B351 Tinea unguium: Secondary | ICD-10-CM

## 2024-09-15 DIAGNOSIS — I10 Essential (primary) hypertension: Secondary | ICD-10-CM | POA: Diagnosis not present

## 2024-09-15 DIAGNOSIS — E1159 Type 2 diabetes mellitus with other circulatory complications: Secondary | ICD-10-CM | POA: Diagnosis not present

## 2024-09-15 DIAGNOSIS — N1831 Chronic kidney disease, stage 3a: Secondary | ICD-10-CM

## 2024-09-15 DIAGNOSIS — I5042 Chronic combined systolic (congestive) and diastolic (congestive) heart failure: Secondary | ICD-10-CM

## 2024-09-15 DIAGNOSIS — K219 Gastro-esophageal reflux disease without esophagitis: Secondary | ICD-10-CM | POA: Diagnosis not present

## 2024-09-15 DIAGNOSIS — E782 Mixed hyperlipidemia: Secondary | ICD-10-CM | POA: Diagnosis not present

## 2024-09-15 LAB — HEMOGLOBIN A1C: Hgb A1c MFr Bld: 6.5 % (ref 4.6–6.5)

## 2024-09-15 LAB — COMPREHENSIVE METABOLIC PANEL WITH GFR
ALT: 38 U/L (ref 3–53)
AST: 32 U/L (ref 5–37)
Albumin: 4.3 g/dL (ref 3.5–5.2)
Alkaline Phosphatase: 105 U/L (ref 39–117)
BUN: 20 mg/dL (ref 6–23)
CO2: 35 meq/L — ABNORMAL HIGH (ref 19–32)
Calcium: 9.4 mg/dL (ref 8.4–10.5)
Chloride: 100 meq/L (ref 96–112)
Creatinine, Ser: 1.16 mg/dL (ref 0.40–1.50)
GFR: 63.55 mL/min (ref >60.00)
Glucose, Bld: 95 mg/dL (ref 70–99)
Potassium: 3.7 meq/L (ref 3.5–5.1)
Sodium: 140 meq/L (ref 135–145)
Total Bilirubin: 0.5 mg/dL (ref 0.2–1.2)
Total Protein: 6.7 g/dL (ref 6.0–8.3)

## 2024-09-15 NOTE — Patient Instructions (Signed)
 It was very nice to see you today!  VISIT SUMMARY: Today, we reviewed your blood pressure, diabetes, kidney function, and other health conditions. Your blood pressure is well-controlled, and we discussed potential new treatments for diabetes and heart protection, but you prefer to avoid additional medications. We also addressed your cholesterol, GERD, and toenail issues.  YOUR PLAN: PRIMARY HYPERTENSION: Your blood pressure is well-controlled with your current medications. -Continue taking lisinopril  hydrochlorothiazide  20/12.5 mg daily. -Continue taking carvedilol  25 mg daily.  TYPE 2 DIABETES MELLITUS: Your diabetes is managed with diet control, but your recent A1c was slightly elevated. -We ordered an A1c test and a metabolic panel to check your kidney function and electrolytes. -Continue with your current diet and exercise routine.  STAGE 3A CHRONIC KIDNEY DISEASE: Your kidney disease is managed with your current medications. -We ordered a metabolic panel to assess your kidney function and electrolytes.  CORONARY ARTERY DISEASE: You have mild shortness of breath with exertion but no chest pain. -Continue taking Plavix  75 mg daily. -Continue follow-up with cardiology for management.  HEART FAILURE: Your heart failure is managed with lisinopril  and carvedilol . -Continue taking lisinopril  and carvedilol . -Continue follow-up with cardiology for management.  HYPERLIPIDEMIA: Your cholesterol levels are at goal with your current medications. -Continue taking atorvastatin  80 mg daily. -Continue taking Zetia  10 mg daily.  GASTROESOPHAGEAL REFLUX DISEASE: Your GERD is well-controlled with pantoprazole . -Continue taking pantoprazole  40 mg daily.  ONYCHOMYCOSIS: You have yellowing and thickening of your toenails, possibly due to nail fungus. -We referred you to a podiatrist for nail care and management.  GENERAL HEALTH MAINTENANCE: You are due for an eye exam and foot exam. -We referred  you to ophthalmology for a diabetic eye exam. -We performed a foot exam today.  No follow-ups on file.   Take care, Arvella Hummer, MD, MS   PLEASE NOTE:  If you had any lab tests, please let us  know if you have not heard back within a few days. You may see your results on mychart before we have a chance to review them but we will give you a call once they are reviewed by us .   If we ordered any referrals today, please let us  know if you have not heard from their office within the next week.   If you had any urgent prescriptions sent in today, please check with the pharmacy within an hour of our visit to make sure the prescription was transmitted appropriately.   Please try these tips to maintain a healthy lifestyle:  Eat at least 3 REAL meals and 1-2 snacks per day.  Aim for no more than 5 hours between eating.  If you eat breakfast, please do so within one hour of getting up.   Each meal should contain half fruits/vegetables, one quarter protein, and one quarter carbs (no bigger than a computer mouse)  Cut down on sweet beverages. This includes juice, soda, and sweet tea.   Drink at least 1 glass of water with each meal and aim for at least 8 glasses per day  Exercise at least 150 minutes every week.

## 2024-09-15 NOTE — Progress Notes (Signed)
 Assessment & Plan   Assessment/Plan:  Assessment and Plan Assessment & Plan Primary hypertension Hypertension is well-controlled with current medication regimen. Blood pressure today is 128/69 mmHg, which is at goal. 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 Recent A1c was borderline elevated at 7.1%. Discussed potential benefits of adding SGLT2 or GLP-1 receptor agonists for cardiovascular and renal protection, but he prefers to avoid additional medications due to concerns about side effects. Emphasized the importance of managing diabetes to prevent complications such as heart attack and kidney dysfunction. - Ordered A1c test - Ordered metabolic panel to assess renal function and electrolytes - Encouraged continued lifestyle modifications including diet and exercise  Stage 3a chronic kidney disease CKD stage 3a is managed with current medications for cardiovascular and renal protection. Recent microalbumin creatinine ratio was normal in September. - Ordered metabolic panel to assess renal function and electrolytes  Coronary artery disease  Peripheral Arterial Disease Managed with Plavix  75 mg daily. No current chest pain reported, but experiences mild shortness of breath with exertion. Discussed potential benefits of SGLT2 or GLP-1 receptor agonists for cardiovascular protection, but he prefers to avoid additional medications. - Continue Plavix  75 mg daily - Continue follow-up with cardiology for management  Combined Congestive Heart failure Managed with lisinopril  and carvedilol . Discussed potential benefits of SGLT2 or GLP-1 receptor agonists for cardiovascular protection, but he prefers to avoid additional medications. - Continue lisinopril  and carvedilol  - Continue follow-up with cardiology for  management  Hyperlipidemia Managed with atorvastatin  80 mg daily and Zetia  10 mg daily. Last lipid panel showed LDL at goal. - Continue atorvastatin  80 mg daily - Continue Zetia  10 mg daily  Gastroesophageal reflux disease GERD is well-controlled with pantoprazole  40 mg daily. No change in symptoms reported. - Continue pantoprazole  40 mg daily  Onychomycosis Yellowing and thickening of toenails, possibly due to nail fungus. Previous toenail removal on left foot. - Referred to podiatrist for nail care and management  General health maintenance Due for eye exam and foot exam. Discussed importance of regular screenings and preventive care. - Referred to ophthalmology for diabetic eye exam - Performed foot exam today        There are no discontinued medications.  Return in about 3 months (around 12/14/2024) for BP, DM.        Subjective:   Encounter date: 09/15/2024  Patrick Tucker is a 73 y.o. male who has Hypertension; Hyperlipidemia; Vitamin D  deficiency; Former smoker; CAD (coronary artery disease); PAD (peripheral artery disease); Chronic heart failure (HCC); OSA (obstructive sleep apnea); Elevated CK; Myalgia; BMI 23.0-23.9, adult; Anemia; Dyspnea on exertion; Financial insecurity due to medical expenses; Abdominal bloating; Stage 2 chronic kidney disease; Stage 3a chronic kidney disease (HCC); BPH associated with nocturia; Small intestinal bacterial overgrowth (SIBO); and Diabetes mellitus (HCC) on their problem list..   He  has a past medical history of Hyperlipidemia, Hypertension (2010), Myocardial infarction (HCC) (2015), Prediabetes (2015), and ST elevation myocardial infarction (STEMI) (HCC).SABRA   He presents with chief complaint of Medical Management of Chronic Issues (Pt presents today for a DM check. Pt is not fasting, no specific questions or concerns. Pt needs his eye exam and foot exam. ) .  Discussed the use of AI scribe software for clinical note transcription  with the patient, who gave verbal consent to proceed.  History of Present Illness Patrick Tucker PB is a 73  year old male with hypertension and coronary artery disease who presents for blood pressure follow-up.  Hypertension - Managed with lisinopril  hydrochlorothiazide  20-12.5 mg daily and carvedilol  25 mg daily. - Blood pressure today was 128/69 mmHg.  Coronary artery disease and exertional dyspnea - On clopidogrel  75 mg daily. - Experiences shortness of breath during exertion, such as lifting objects and walking distances. - Dyspnea resolves after brief rest. - No chest pain.  Hyperlipidemia - Managed with atorvastatin  80 mg daily and ezetimibe  10 mg daily. - Last lipid panel showed LDL level of 40 mg/dL.  Diabetes mellitus - Diet-controlled. - Last hemoglobin A1c borderline elevated at 7.1%. - Maintains an active lifestyle, walking four miles daily and playing pickleball. - Diet is rich in vegetables and limited in fruits.  Chronic kidney disease - Stage 3A chronic kidney disease. - Microalbumin creatinine ratio was normal in September. - On ACE inhibitor therapy.  Gastroesophageal reflux disease - Well controlled with pantoprazole  40 mg daily. - No change in symptoms.  ROS  Past Surgical History:  Procedure Laterality Date   CORONARY STENT INTERVENTION N/A 05/05/2020   Procedure: CORONARY STENT INTERVENTION;  Surgeon: Burnard Debby LABOR, MD;  Location: [facility];  Service: Cardiovascular;  Laterality: N/A;   CORONARY/GRAFT ACUTE MI REVASCULARIZATION N/A 05/05/2020   Procedure: Coronary/Graft Acute MI Revascularization;  Surgeon: Burnard Debby LABOR, MD;  Location: [facility];  Service: Cardiovascular;  Laterality: N/A;   LEFT HEART CATH AND CORONARY ANGIOGRAPHY N/A 05/05/2020   Procedure: LEFT HEART CATH AND CORONARY ANGIOGRAPHY;  Surgeon: Burnard Debby LABOR, MD;  Location: [facility];  Service: Cardiovascular;  Laterality: N/A;    Medications Ordered  Prior to Encounter[1]  Family History  Problem Relation Age of Onset   Cancer Mother    Hypertension Father    Heart disease Father    Diabetes Sister    Stroke Brother    COPD Brother    Hypertension Brother    COPD Brother     Social History   Socioeconomic History   Marital status: Married    Spouse name: sarah   Number of children: 1   Years of education: Not on file   Highest education level: Not on file  Occupational History   Not on file  Tobacco Use   Smoking status: Former    Current packs/day: 0.00    Average packs/day: 0.3 packs/day    Types: Cigarettes    Quit date: 2000    Years since quitting: 25.9   Smokeless tobacco: Never  Vaping Use   Vaping status: Never Used  Substance and Sexual Activity   Alcohol use: Not Currently   Drug use: Never   Sexual activity: Not Currently    Birth control/protection: None  Other Topics Concern   Not on file  Social History Narrative   Not on file   Social Drivers of Health   Tobacco Use: Medium Risk (09/10/2024)   Received from [facility]   Patient History    Smoking Tobacco Use: Former    Smokeless Tobacco Use: Never    Passive Exposure: Not on file  Financial Resource Strain: Low Risk (06/04/2024)   Overall Financial Resource Strain (CARDIA)    Difficulty of Paying Living Expenses: Not hard at all  Food Insecurity: No Food Insecurity (06/04/2024)   Epic    Worried About Radiation Protection Practitioner of Food in the Last Year: Never true    Ran Out of Food in the Last Year:  Never true  Transportation Needs: No Transportation Needs (06/04/2024)   Epic    Lack of Transportation (Medical): No    Lack of Transportation (Non-Medical): No  Physical Activity: Sufficiently Active (06/04/2024)   Exercise Vital Sign    Days of Exercise per Week: 5 days    Minutes of Exercise per Session: 120 min  Stress: No Stress Concern Present (06/04/2024)   [employer] of Occupational Health - Occupational Stress Questionnaire    Feeling  of Stress: Not at all  Social Connections: Moderately Integrated (06/04/2024)   Social Connection and Isolation Panel    Frequency of Communication with Friends and Family: More than three times a week    Frequency of Social Gatherings with Friends and Family: More than three times a week    Attends Religious Services: More than 4 times per year    Active Member of [employer] or Organizations: No    Attends Banker Meetings: Never    Marital Status: Married  Catering Manager Violence: Not At Risk (09/10/2024)   Received from [facility]   Epic    Within the last year, have you been afraid of your partner or ex-partner?: No    Within the last year, have you been humiliated or emotionally abused in other ways by your partner or ex-partner?: No    Within the last year, have you been kicked, hit, slapped, or otherwise physically hurt by your partner or ex-partner?: No    Within the last year, have you been raped or forced to have any kind of sexual activity by your partner or ex-partner?: No  Depression (PHQ2-9): Low Risk (09/15/2024)   Depression (PHQ2-9)    PHQ-2 Score: 0  Alcohol Screen: Low Risk (06/04/2024)   Alcohol Screen    Last Alcohol Screening Score (AUDIT): 0  Housing: Unknown (06/04/2024)   Epic    Unable to Pay for Housing in the Last Year: No    Number of Times Moved in the Last Year: Not on file    Homeless in the Last Year: No  Utilities: Not At Risk (06/04/2024)   Epic    Threatened with loss of utilities: No  Health Literacy: Adequate Health Literacy (06/04/2024)   B1300 Health Literacy    Frequency of need for help with medical instructions: Never                                                                                                  Objective:  Physical Exam: BP 128/69   Pulse 63   Temp 98.2 F (36.8 C)   Ht 5' 9 (1.753 m)   Wt 174 lb 3.2 oz (79 kg)   SpO2 97%   BMI 25.72 kg/m   Physical Exam Constitutional:      Appearance: Normal  appearance.  HENT:     Head: Normocephalic and atraumatic.     Right Ear: Hearing normal.     Left Ear: Hearing normal.     Nose: Nose normal.  Eyes:     General: No scleral icterus.       Right  eye: No discharge.        Left eye: No discharge.     Extraocular Movements: Extraocular movements intact.  Cardiovascular:     Rate and Rhythm: Normal rate and regular rhythm.     Heart sounds: Normal heart sounds.  Pulmonary:     Effort: Pulmonary effort is normal.     Breath sounds: Normal breath sounds.  Abdominal:     Palpations: Abdomen is soft.     Tenderness: There is no abdominal tenderness.  Skin:    General: Skin is warm.     Findings: No rash.  Neurological:     General: No focal deficit present.     Mental Status: He is alert.     Cranial Nerves: No cranial nerve deficit.  Psychiatric:        Mood and Affect: Mood normal.        Behavior: Behavior normal.        Thought Content: Thought content normal.        Judgment: Judgment normal.     Diabetic foot exam was performed with the following findings:   Normal sensation of 10g monofilament Intact posterior tibialis and dorsalis pedis pulses Toe Nails Thick and yellow throughout      No results found.  No results found for this or any previous visit (from the past 2160 hours).      Beverley KATHEE Hummer, MD  I,Emily Lagle,acting as a scribe for Beverley KATHEE Hummer, MD.,have documented all relevant documentation on the behalf of Beverley KATHEE Hummer, MD.  LILLETTE Beverley KATHEE Hummer, MD, have reviewed all documentation for this visit. The documentation on 09/15/2024 for the exam, diagnosis, procedures, and orders are all accurate and complete.     [1]  Current Outpatient Medications on File Prior to Visit  Medication Sig Dispense Refill   ascorbic acid (VITAMIN C ) 1000 MG tablet Take 1,000 mg by mouth daily.     atorvastatin  (LIPITOR ) 80 MG tablet Take 1 tablet by mouth daily.     carvedilol  (COREG ) 25 MG tablet Take 25 mg  by mouth.     Cholecalciferol (VITAMIN D -3) 125 MCG (5000 UT) TABS Take 1 tablet by mouth daily.     clopidogrel  (PLAVIX ) 75 MG tablet Take   1 tablet   Daily     to Prevent Blood Clots 90 tablet 0   ezetimibe  (ZETIA ) 10 MG tablet Take 10 mg by mouth daily.     lisinopril -hydrochlorothiazide  (ZESTORETIC ) 20-12.5 MG tablet Take 1 tablet by mouth daily. 90 tablet 3   Multiple Vitamin (MULTIVITAMIN) tablet Take 1 tablet by mouth daily.     pantoprazole  (PROTONIX ) 40 MG tablet Take  1 tablet  Daily  to Prevent Indigestion & Heartburn 90 tablet 3   vitamin E 45 MG (100 UNITS) capsule Take 100 Units by mouth daily.     No current facility-administered medications on file prior to visit.

## 2024-09-17 ENCOUNTER — Ambulatory Visit: Payer: Self-pay | Admitting: Family Medicine

## 2024-09-20 ENCOUNTER — Telehealth: Payer: Self-pay | Admitting: Internal Medicine

## 2024-09-20 NOTE — Telephone Encounter (Signed)
 I was contacted by the patient's primary cardiologist at [facility] that Patrick Tucker wishes to transfer his cardiology care to our practice, as [facility] is no longer in network with his insurance.  I spoke with Patrick Tucker by phone and will arrange for him to establish with me at the [facility] office in ~3 months.  Our office will reach out to him to schedule an appointment.  Lonni Hanson, MD [facility]

## 2024-09-21 NOTE — Telephone Encounter (Signed)
 Attempted to contact pt. Left a message for the patient to call back.

## 2024-09-24 NOTE — Telephone Encounter (Signed)
 Attempted to contact pt x 2. Left a message to call back.

## 2024-10-21 ENCOUNTER — Telehealth: Payer: Self-pay

## 2024-10-21 DIAGNOSIS — G4733 Obstructive sleep apnea (adult) (pediatric): Secondary | ICD-10-CM

## 2024-10-21 NOTE — Telephone Encounter (Signed)
 Copied from CRM #[id number]. Topic: Clinical - Order For Equipment >> Oct 21, 2024  9:03 AM Patrick Tucker wrote: Reason for CRM: Patient is calling to request an rx for cpap supplies be sent to [facility] - states he has to go through [facility] to get to [facility]. States was provided by Agent at [facility].  Patient is requesting this be handled today and somebody call him to update when it has been done. Patient wants ONLY a call, no MyChart messages.   Called and spoke to pt. Advised that order has been sent. Pt verbalized understanding, NFN.  Order placed under Dr. Olena, as pt has upcoming TOC appt.

## 2024-10-22 ENCOUNTER — Telehealth: Payer: Self-pay | Admitting: Pulmonary Disease

## 2024-10-22 NOTE — Telephone Encounter (Signed)
 Hey there would you mind signing this DME order? Thanks!

## 2024-10-22 NOTE — Telephone Encounter (Signed)
 Hey would you mind signing this DME order? Thanks!

## 2024-10-26 ENCOUNTER — Ambulatory Visit: Admitting: Podiatry

## 2024-10-26 VITALS — Ht 69.0 in | Wt 174.0 lb

## 2024-10-26 DIAGNOSIS — B351 Tinea unguium: Secondary | ICD-10-CM | POA: Diagnosis not present

## 2024-10-26 NOTE — Telephone Encounter (Signed)
 Order has been signed and sent to DME. NFN

## 2024-10-26 NOTE — Progress Notes (Signed)
"  °  Subjective:  Patient ID: Patrick Tucker, male    DOB: 06/09/1951,  MRN: [id number]  Chief Complaint  Patient presents with   Nail Problem    RM 1 RFC/ Nail fungus    Discussed the use of AI scribe software for clinical note transcription with the patient, who gave verbal consent to proceed.  History of Present Illness He is a 74 year old male with peripheral artery disease who presents with a callus on the right foot and a painful split nail on the left great toe.  He returns for follow-up today, notes quite a bit of improvement with regrowth of the left great toenail that we removed.  Planning to start having pedicures on a regular basis.  His A1c is well-controlled.      Objective:    Physical Exam VASCULAR: Left foot warm and well-perfused. Capillary fill time is brisk. Weakly palpable DP and PT pulse. DERMATOLOGIC: Multiple thickened dystrophic toenails of the lesser toes bilaterally, good regrowth of left hallux nail.  Mild onychomycosis.  Improved since last visit NEUROLOGIC: Normal sensation to light touch and pressure. No paresthesias on examination. ORTHOPEDIC: Smooth, pain-free range of motion of all examined joints. No ecchymosis or bruising. No gross deformity. No pain to palpation.   No images are attached to the encounter.    Results Hemoglobin A1c 09/15/2024 6.5%   Assessment:   1. Onychomycosis      Plan:  Patient was evaluated and treated and all questions answered.  Assessment and Plan Assessment & Plan Onychomycosis Doing much better.  Debrided nails today sharply with a nail nipper discussed with him the role of regular nail care and pedicures and he would like to utilize this which I think is reasonable given his adequate circulation now after his previous stenting and his A1c is very well-controlled.  Follow-up as needed for further care, we discussed the dystrophy of the left second toenail.  Most of this is due to the digital contracture and  elongation of the second toe compared to the other toenails.  Discussed permanent matricectomy of this nail if it continues to be bothersome he will let me know if he would like to proceed with this.  Follow-up with me as needed.      Return if symptoms worsen or fail to improve.   "

## 2024-11-04 NOTE — Telephone Encounter (Signed)
 Copied from CRM [id number]. Topic: Clinical - Order For Equipment >> Nov 04, 2024  9:22 AM Joesph PARAS wrote: Reason for CRM: Patient is calling to state that he has yet to hear from [employer]. Patient submitted request 01/22, acknowledged as received on 01/27. States he was told Tuesday by someone (he does not know who) that they do not have a prescription.  Patient is requesting update when this has been done via phone call.  ______________________________________________________________________________  [place] and spoke to the resupply team at [employer]. They stated that they have everything that they need for the order, but that they have to speak with the pt to verify that he wants the supplies sent out. They provided the following number for the patient to call:  [phone]  Called and spoke to patient. Advised of [employer]'s recommendations and provided patient with the phone number to call. Pt verbalized understanding, NFN.

## 2024-11-30 ENCOUNTER — Ambulatory Visit: Admitting: Family Medicine

## 2024-12-07 ENCOUNTER — Encounter: Admitting: Pulmonary Disease

## 2024-12-17 ENCOUNTER — Ambulatory Visit: Admitting: Family Medicine

## 2025-06-06 ENCOUNTER — Ambulatory Visit
